# Patient Record
Sex: Female | Born: 1945
Health system: Southern US, Community
[De-identification: ages and names within clinical notes are randomized; demographics above are authoritative.]

## PROBLEM LIST (undated history)

## (undated) DIAGNOSIS — I4891 Unspecified atrial fibrillation: Secondary | ICD-10-CM

## (undated) DIAGNOSIS — M1990 Unspecified osteoarthritis, unspecified site: Secondary | ICD-10-CM

## (undated) DIAGNOSIS — N319 Neuromuscular dysfunction of bladder, unspecified: Secondary | ICD-10-CM

## (undated) DIAGNOSIS — I10 Essential (primary) hypertension: Secondary | ICD-10-CM

## (undated) DIAGNOSIS — R269 Unspecified abnormalities of gait and mobility: Secondary | ICD-10-CM

## (undated) DIAGNOSIS — K219 Gastro-esophageal reflux disease without esophagitis: Secondary | ICD-10-CM

## (undated) DIAGNOSIS — G35D Multiple sclerosis, unspecified: Secondary | ICD-10-CM

## (undated) DIAGNOSIS — G35 Multiple sclerosis: Secondary | ICD-10-CM

## (undated) DIAGNOSIS — E039 Hypothyroidism, unspecified: Secondary | ICD-10-CM

## (undated) DIAGNOSIS — E7800 Pure hypercholesterolemia, unspecified: Secondary | ICD-10-CM

## (undated) DIAGNOSIS — G500 Trigeminal neuralgia: Secondary | ICD-10-CM

## (undated) HISTORY — DX: Gastro-esophageal reflux disease without esophagitis: K21.9

## (undated) HISTORY — DX: Unspecified abnormalities of gait and mobility: R26.9

## (undated) HISTORY — DX: Trigeminal neuralgia: G50.0

## (undated) HISTORY — DX: Hypothyroidism, unspecified: E03.9

## (undated) HISTORY — PX: ABDOMINAL HYSTERECTOMY: SHX81

## (undated) HISTORY — DX: Neuromuscular dysfunction of bladder, unspecified: N31.9

---

## 1998-09-09 ENCOUNTER — Encounter: Payer: Self-pay | Admitting: Family Medicine

## 1998-09-09 ENCOUNTER — Ambulatory Visit (HOSPITAL_COMMUNITY): Admission: RE | Admit: 1998-09-09 | Discharge: 1998-09-09 | Payer: Self-pay | Admitting: Family Medicine

## 1999-09-24 ENCOUNTER — Inpatient Hospital Stay (HOSPITAL_COMMUNITY): Admission: AD | Admit: 1999-09-24 | Discharge: 1999-09-29 | Payer: Self-pay | Admitting: Neurology

## 1999-09-24 ENCOUNTER — Encounter: Payer: Self-pay | Admitting: Neurology

## 1999-09-29 ENCOUNTER — Inpatient Hospital Stay (HOSPITAL_COMMUNITY)
Admission: RE | Admit: 1999-09-29 | Discharge: 1999-10-07 | Payer: Self-pay | Admitting: Physical Medicine and Rehabilitation

## 1999-09-29 ENCOUNTER — Encounter: Payer: Self-pay | Admitting: Physical Medicine and Rehabilitation

## 2001-01-05 ENCOUNTER — Encounter: Admission: RE | Admit: 2001-01-05 | Discharge: 2001-01-05 | Payer: Self-pay | Admitting: Family Medicine

## 2001-01-05 ENCOUNTER — Encounter: Payer: Self-pay | Admitting: Family Medicine

## 2001-01-13 ENCOUNTER — Encounter: Payer: Self-pay | Admitting: Family Medicine

## 2001-01-13 ENCOUNTER — Encounter: Admission: RE | Admit: 2001-01-13 | Discharge: 2001-01-13 | Payer: Self-pay | Admitting: Family Medicine

## 2002-01-18 ENCOUNTER — Encounter: Payer: Self-pay | Admitting: Family Medicine

## 2002-01-18 ENCOUNTER — Encounter: Admission: RE | Admit: 2002-01-18 | Discharge: 2002-01-18 | Payer: Self-pay | Admitting: Family Medicine

## 2003-12-16 ENCOUNTER — Encounter: Admission: RE | Admit: 2003-12-16 | Discharge: 2004-02-07 | Payer: Self-pay | Admitting: Neurology

## 2005-06-21 ENCOUNTER — Encounter: Admission: RE | Admit: 2005-06-21 | Discharge: 2005-06-21 | Payer: Self-pay | Admitting: Family Medicine

## 2006-03-09 ENCOUNTER — Encounter: Admission: RE | Admit: 2006-03-09 | Discharge: 2006-03-09 | Payer: Self-pay | Admitting: Family Medicine

## 2006-04-25 ENCOUNTER — Emergency Department (HOSPITAL_COMMUNITY): Admission: EM | Admit: 2006-04-25 | Discharge: 2006-04-25 | Payer: Self-pay | Admitting: Emergency Medicine

## 2006-06-10 ENCOUNTER — Encounter: Admission: RE | Admit: 2006-06-10 | Discharge: 2006-06-10 | Payer: Self-pay | Admitting: Family Medicine

## 2007-08-07 ENCOUNTER — Encounter: Admission: RE | Admit: 2007-08-07 | Discharge: 2007-08-07 | Payer: Self-pay | Admitting: Family Medicine

## 2008-08-20 ENCOUNTER — Encounter: Admission: RE | Admit: 2008-08-20 | Discharge: 2008-08-20 | Payer: Self-pay | Admitting: Family Medicine

## 2008-10-29 ENCOUNTER — Encounter: Admission: RE | Admit: 2008-10-29 | Discharge: 2008-12-13 | Payer: Self-pay | Admitting: Neurology

## 2009-12-22 ENCOUNTER — Encounter: Admission: RE | Admit: 2009-12-22 | Discharge: 2009-12-22 | Payer: Self-pay | Admitting: Family Medicine

## 2010-07-08 ENCOUNTER — Ambulatory Visit: Payer: Medicare Other | Admitting: Rehabilitative and Restorative Service Providers"

## 2010-08-21 NOTE — Discharge Summary (Signed)
[facility]  Patient:    Jillian Fisher, Jillian Fisher                    MRN: [id number] Adm. Date:  [id number] Disc. Date: 09/29/99 Attending:  Evern Core                           Discharge Summary  PATIENT ADDRESS: [address] [place], [place] [address]  ADDENDUM: The patient was to be discharged on September 28, 1999 but was kept on 6700 another day because of unavailability of a bed on the rehabilitation service.  She did have urine which smelled of ammonia on September 29, 1999 and it was decided to repeat a catheterized urine and culture and to reevaluate her myelopathy with MRI with and without contrast enhancement of the cervical and thoracic region, which were ordered the day that she subsequently was taken to rehabilitation because a bed became available.  Results of those studies are unknown at this time.  The patient is discharged to the rehabilitation service with pending studies of catheterized urine for urinalysis and culture and MRI with and without enhancement of the cervical and thoracic region. DD:  09/30/99 TD:  09/30/99 Job: [id number]

## 2010-08-21 NOTE — Discharge Summary (Signed)
[place]  Jillian Fisher:    Jillian, Fisher                      MRN: [id number] Adm. Date:  09/29/99 Disc. Date: 10/07/99 Attending:  Laurier Nancy, M.D. Dictator:   Dian Situ, PA CC:         Genene Churn. Love, M.D.             Desma Maxim, M.D.                           Discharge Summary  DISCHARGE DIAGNOSES: 1. Transverse myelitis. 2. History of multiple sclerosis. 3. Proteus urinary tract infection. 4. Stress incontinence, improved.  HISTORY OF PRESENT ILLNESS:  Jillian Fisher is a 65 year old female with a history of MS, on Betaseron since December 1998, who fell a few days prior to admission, with gradual onset of numbness and weakness of the left lower extremity and difficulty with ambulating.  She was admitted by Dr. Genene Churn. Love and noted to be hypokalemic with potassium 2.8 and question etiology of weakness secondary to transverse myelitis.  Jillian Fisher is known to have urinary incontinence, ? secondary to ______ .  UA with greater than 100,000 colonies of gram-negative rods.  MRI of cervical and thoracic spine to be done past admission.  Currently, Jillian Fisher is min-to-mod assist for transfers, mod-assist for ambulating 20 feet with standard walker and having problems with hyperextension of the left knee with ataxia of left lower extremity.  Jillian Fisher to complete five-day course of Solu-Medrol in a.m.  PAST MEDICAL HISTORY:  Significant for MS, hysterectomy, history of urgency and incontinence.  ALLERGIES:  No known drug allergies.  SOCIAL HISTORY:  Jillian Fisher is married and lives with husband in one-level home with two steps at entry.  She was independent prior to fall a week ago.  Has husband who works second shift and daughter who can assist during day.  Has daughter and sister who can provide assistance.  She does not use any tobacco or alcohol.  HOSPITAL COURSE:  Jillian Fisher was admitted to rehab on September 29, 1999 for inpatient therapies to consist of PT and OT daily.  Jillian Fisher was continued on subcu heparin for DVT prophylaxis.  As patients culture grew out greater than 100,000 colonies of gram-negative rods, Cipro was started and Jillian Fisher completed a seven-day course of antibiotic therapy.  She did continue with some problems with stress incontinence and Tofranil was added with improvement in her symptomatology.  Jillian Fisher continued on a prednisone taper during her stay.  MRI of right cervical and thoracic spine done showed ______ multiple areas of increased signals with number and extent of cord lesions to have increased in cervical spine and ______ small hydromyelia at L3 level.  Renal ultrasound showed no hydronephrosis or parenchymal abnormality.   Labs at admission showed hemoglobin 14.3, hematocrit 41.4, WBC 15.7, platelets 311,000.  Sodium 139, potassium 4.0, chloride 101, CO2 30, BUN 20, creatinine 0.9 and glucose 139.  LFTs were within normal limits.  Jillian Fisher was noted to have some diarrhea while on antibiotics and stools were negative for C. difficile toxin.  Stool guaiacs were also negative.  Patients left lower extremity strength has slowly been improving during her stay.  She has remained motivated and has made good progress.  By time of discharge, she was independent for bed mobility, independent for transfers, moderately independent for ambulating 150 feet  using standard walker, moderately independent for simple living skills.  Followup therapies in terms of home health PT to continue past discharge.  Patients endurance level remains limited and she continues to require one to two rest breaks during self-care sessions; with continued therapies, this should hopefully improve with time.  On October 07, 1999, Jillian Fisher is discharged to home.  DISCHARGE MEDICATIONS: 1. Calcitonin nasal spray one squirt q.d. 2. Premarin 0.625 mg per day. 3. Betaseron every other day. 4. Prednisone  20 mg per day x 5 days then discontinue. 5. Tofranil 25 mg per day.  ACTIVITY:  As tolerated with assist for safety.  DIET:  Regular.  SPECIAL INSTRUCTIONS:  Home health physical therapy to continue.  FOLLOWUP:  Jillian Fisher is to follow up with Dr. Desma Maxim and Dr. Sandria Manly in the next few weeks.  Follow up with Dr. Laurier Nancy as needed. DD:  10/27/99 TD:  10/30/99 Job: [id number]

## 2010-08-21 NOTE — Discharge Summary (Signed)
. [facility]  Patient:    Jillian Fisher, Jillian Fisher                    MRN: [id number] Adm. Date:  [id number] Disc. Date: 09/28/99 Attending:  Erich Montane                           Discharge Summary  PATIENTS ADDRESS:  [address], [place], [place]  [address].  DATE OF BIRTH:  07-10-1945  This was one of several [facility] admissions for this 65 year old right-handed black married female from [place], [place], admitted from the office for evaluation of left leg weakness.  HISTORY OF PRESENT ILLNESS:  This patient has a long history of numbness in her hands, beginning in the late 1980s, characterized by numbness more along the palmar surface of her hand and greater on the right than the left.  She had had nerve conduction studies in the past and was followed by Dr. Fonnie Jarvis for the question of carpal tunnel syndrome, but was seen by Dr. Myrtie Neither in 1998, and underwent a cervical MRI study at [facility] showing evidence of long T2 signal changes in the cord at C3 and C5 on November 02, 1996.  The MRI study of the brain on December 27, 1996, at [facility] was markedly abnormal, showing evidence of multiple sclerosis, and CSF evaluation January 11, 1997, showed protein 40, glucose 72, no red blood cells, and 5 white blood cells.  There was an abnormal IgG of 8.2, with upper limits of normal being 3.3, and there were oligoclonal bands present consistent with the diagnosis of multiple sclerosis.  She was begun on Betaseron therapy in December 1998, and generally did very well until this past Sunday, September 21, 1999, when she was at home and fell because her left knee gave way.  She was seen at a local emergent care center and underwent x-rays of her left wrist and of her knee, which showed no significant abnormalities.  She complained of numbness in her left leg and weakness in her left leg without associated  pain. She had great difficulty getting around in her house.  She was seen in the office on September 24, 1999, and admitted.  Although she had no bowel or bladder dysfunction at that time, in the hospital she has had some bladder leakage. There has been no history of Lhermitte sign, single eye visual loss, double vision, swallowing problems, etc.  PAST MEDICAL HISTORY:  Hysterectomy greater than 25 years ago, but no history of high blood pressure, diabetes, heart disease, stroke, cancer, convulsions, unconsciousness, or venereal disease.  SOCIAL HISTORY:  She has been married for approximately 31 years, and finished the eighth grade in school.  Does not drink alcohol or smoke cigarettes.  FAMILY HISTORY:  Mother died at age 35 from myocardial infarction and stroke. Her father died at age 75 from unknown causes.  She has brothers of 56 and 29, living and well, and sisters who are living and well; a son is 52, a daughter is 63, living and well.  PHYSICAL EXAMINATION:  Well-developed, pleasant black female in no acute distress.  Blood pressure in the right and left arm of 140/80, a heart rate of 64 and regular.  No carotid or supraclavicular bruits heard.  Flexion and extension maneuvers revealed restricted motion.  On mental status, she was alert and oriented x 3,  and followed one-, two-, and three-step commands.  Her cranial nerve examination revealed acuity of 20/25 bilaterally with glasses, and the rest of her cranial nerves were normal.  Her motor examination was remarkable for spooning in her left hand held out straight, and her examination revealed significant weakness in the left leg distally more than proximally and 3/5 range proximally and 2/5 range distally.  Sensory examination was intact, surprisingly.  Pinprick to light touch ______ operation testing and deep tendon reflexes were 2+.  Her plantar responses were either withdrawal or upgoing.  Her general examination was  unremarkable.  LABORATORY DATA:  Urinalysis was unremarkable, except for urobilinogen of 1.0. The hemoglobin was 13.0, hematocrit was 36.3, white blood cell count was 8000, platelet count was 290,000.  Sodium 139, potassium low at 2.8, and she was placed on potassium replacement with a value of 3.4, glucose was 118, BUN 11, creatinine 0.9, calcium 9.8, total protein 8.0, albumin 3.8.  Liver function tests were normal.  Urine culture has grown no growth to date.  Her 12-lead EKG showed normal sinus rhythm and left atrial enlargement with nonspecific EKG.  Her chest x-ray revealed a normal chest x-ray.  HOSPITAL COURSE:  The patient was admitted, placed on high-dose IV Solu-Medrol of 250 mg IV q.6h. for five days.  She is currently in the fourth day of therapy.  She was seen in consultation by the rehabilitation service and felt to be a good candidate for rehabilitation.  She was followed by the OT and PT team.  Dr. Thomasena Edis felt, as the rehabilitation specialist, that she would benefit from a stay on rehabilitation, and she was to go on September 28, 1999.  No other studies have been done at this time of her spinal cord, but she did develop some incontinence in the hospital.  It is felt that she most likely had had transverse myelitis secondary to multiple sclerosis.  There was definite improvement in her strength in the hospital and improvement in her gait.  Her graphic sheet revealed that she remained afebrile.  Her blood pressure was normal.  Her heart rate was 72, respiratory rate was 22, with an O2 of 98%.  At the time of transfer, she was able to get up with assistance only and was able to walk from the bed to the chair and was able to sit up for several hours at a time.  She seemed to have more of an apraxia with her left leg and, at the time of discharge, she had weakness distally much more than proximally in her left foot and leg with what I thought were downgoing toes at this point  in time.  IMPRESSION: 1. Left leg monoparesis, code 344.30. 2. Multiple sclerosis, code 340. 3. Gait disorder, probably secondary to #2, code 781.2. 4. Hypokalemia.   PLAN:  Repeat her electrolytes and transfer her to the rehabilitation service. If she has not shown significant improvement, the possibility of MRI of the cervical and thoracic region may be obtained in the future. DD:  09/28/99 TD:  09/28/99 Job: [id number]

## 2010-12-14 ENCOUNTER — Other Ambulatory Visit: Payer: Self-pay | Admitting: Family Medicine

## 2010-12-14 DIAGNOSIS — Z1231 Encounter for screening mammogram for malignant neoplasm of breast: Secondary | ICD-10-CM

## 2010-12-28 ENCOUNTER — Ambulatory Visit
Admission: RE | Admit: 2010-12-28 | Discharge: 2010-12-28 | Disposition: A | Discharge status: Home / Self Care | Payer: Medicare Other | Source: Ambulatory Visit | Attending: Family Medicine | Admitting: Family Medicine

## 2010-12-28 DIAGNOSIS — Z1231 Encounter for screening mammogram for malignant neoplasm of breast: Secondary | ICD-10-CM

## 2012-01-13 ENCOUNTER — Other Ambulatory Visit: Payer: Self-pay | Admitting: Family Medicine

## 2012-01-13 DIAGNOSIS — Z1231 Encounter for screening mammogram for malignant neoplasm of breast: Secondary | ICD-10-CM

## 2012-01-20 ENCOUNTER — Ambulatory Visit
Admission: RE | Admit: 2012-01-20 | Discharge: 2012-01-20 | Disposition: A | Discharge status: Home / Self Care | Payer: Medicare Other | Source: Ambulatory Visit | Attending: Family Medicine | Admitting: Family Medicine

## 2012-01-20 DIAGNOSIS — Z1231 Encounter for screening mammogram for malignant neoplasm of breast: Secondary | ICD-10-CM

## 2012-01-24 ENCOUNTER — Encounter (HOSPITAL_COMMUNITY): Payer: Self-pay | Admitting: Emergency Medicine

## 2012-01-24 ENCOUNTER — Emergency Department (HOSPITAL_COMMUNITY): Payer: Medicare Other

## 2012-01-24 ENCOUNTER — Emergency Department (HOSPITAL_COMMUNITY)
Admission: EM | Admit: 2012-01-24 | Discharge: 2012-01-24 | Disposition: A | Discharge status: Home / Self Care | Payer: Medicare Other | Attending: Emergency Medicine | Admitting: Emergency Medicine

## 2012-01-24 DIAGNOSIS — Z90710 Acquired absence of both cervix and uterus: Secondary | ICD-10-CM | POA: Insufficient documentation

## 2012-01-24 DIAGNOSIS — Z87891 Personal history of nicotine dependence: Secondary | ICD-10-CM | POA: Insufficient documentation

## 2012-01-24 DIAGNOSIS — I10 Essential (primary) hypertension: Secondary | ICD-10-CM | POA: Insufficient documentation

## 2012-01-24 DIAGNOSIS — G35 Multiple sclerosis: Secondary | ICD-10-CM | POA: Insufficient documentation

## 2012-01-24 DIAGNOSIS — R51 Headache: Secondary | ICD-10-CM | POA: Insufficient documentation

## 2012-01-24 DIAGNOSIS — M129 Arthropathy, unspecified: Secondary | ICD-10-CM | POA: Insufficient documentation

## 2012-01-24 DIAGNOSIS — E7800 Pure hypercholesterolemia, unspecified: Secondary | ICD-10-CM | POA: Insufficient documentation

## 2012-01-24 HISTORY — DX: Multiple sclerosis: G35

## 2012-01-24 HISTORY — DX: Multiple sclerosis, unspecified: G35.D

## 2012-01-24 HISTORY — DX: Pure hypercholesterolemia, unspecified: E78.00

## 2012-01-24 HISTORY — DX: Essential (primary) hypertension: I10

## 2012-01-24 HISTORY — DX: Unspecified osteoarthritis, unspecified site: M19.90

## 2012-01-24 MED ORDER — METOCLOPRAMIDE HCL 5 MG/ML IJ SOLN
10.0000 mg | Freq: Once | INTRAMUSCULAR | Status: AC
  Administered 2012-01-24: 10 mg via INTRAVENOUS
  Filled 2012-01-24: qty 2

## 2012-01-24 MED ORDER — DIPHENHYDRAMINE HCL 50 MG/ML IJ SOLN
25.0000 mg | Freq: Once | INTRAMUSCULAR | Status: AC
  Administered 2012-01-24: 25 mg via INTRAVENOUS
  Filled 2012-01-24: qty 1

## 2012-01-24 MED ORDER — DEXAMETHASONE SODIUM PHOSPHATE 10 MG/ML IJ SOLN
10.0000 mg | Freq: Once | INTRAMUSCULAR | Status: AC
  Administered 2012-01-24: 10 mg via INTRAVENOUS
  Filled 2012-01-24: qty 1

## 2012-01-24 MED ORDER — SODIUM CHLORIDE 0.9 % IV BOLUS (SEPSIS)
500.0000 mL | Freq: Once | INTRAVENOUS | Status: AC
  Administered 2012-01-24: 500 mL via INTRAVENOUS

## 2012-01-24 NOTE — ED Provider Notes (Signed)
Medical screening examination/treatment/procedure(s) were performed by non-physician practitioner and as supervising physician I was immediately available for consultation/collaboration.  Leaha Cuervo M David Rodriquez, MD 01/24/12 0501 

## 2012-01-24 NOTE — ED Provider Notes (Signed)
History     CSN: [id number]  Arrival date & time 01/24/12  0221   First MD Initiated Contact with Patient 01/24/12 0245      Chief Complaint  Patient presents with  . Headache   HPI  History provided by the patient. Patient is a 66 year old female with history of hypertension, hypercholesterolemia and multiple sclerosis who presents with complaints of a headache that began yesterday around 3 PM. Symptoms began acutely and have been persistent. Headache is not described as the worst headache of the patient's life. Headache does feel different than previous sinus headaches or other headache she has ever had. Patient has difficulty describing what makes her headache different today. Headache is located primarily in the back of the head and temporal area. Symptoms are slightly worse with lites and movement. She denies any fever, chills, sweats, ataxia, weakness or numbness in extremities, speech difficulty or weakness in face muscles.     Past Medical History  Diagnosis Date  . Multiple sclerosis   . Hypertension   . Hypercholesteremia   . Arthritis     Past Surgical History  Procedure Date  . Abdominal hysterectomy     History reviewed. No pertinent family history.  History  Substance Use Topics  . Smoking status: Former Games developer  . Smokeless tobacco: Not on file   Comment: quit 30-40 years ago (documented in 2013)  . Alcohol Use: No    OB History    Grav Para Term Preterm Abortions TAB SAB Ect Mult Living                  Review of Systems  Constitutional: Negative for fever, chills, diaphoresis and appetite change.  Respiratory: Negative for shortness of breath.   Cardiovascular: Negative for chest pain and palpitations.  Gastrointestinal: Positive for nausea. Negative for vomiting, abdominal pain, diarrhea and constipation.  Neurological: Positive for headaches. Negative for dizziness, syncope, facial asymmetry, speech difficulty, weakness, light-headedness and  numbness.    Allergies  Sulfa antibiotics  Home Medications   Current Outpatient Rx  Name Route Sig Dispense Refill  . ACETAMINOPHEN 500 MG PO TABS Oral Take 1,000 mg by mouth every 6 (six) hours as needed. For pain    . ADULT MULTIVITAMIN W/MINERALS CH Oral Take 1 tablet by mouth daily.      BP 159/98  Pulse 110  Temp 99 F (37.2 C) (Oral)  Resp 18  SpO2 97%  Physical Exam  Nursing note and vitals reviewed. Constitutional: She is oriented to person, place, and time. She appears well-developed and well-nourished. No distress.  HENT:  Head: Normocephalic and atraumatic.  Eyes: Conjunctivae normal and EOM are normal. Pupils are equal, round, and reactive to light.  Neck: Normal range of motion. Neck supple.       No meningeal signs  Cardiovascular: Normal rate and regular rhythm.   No murmur heard. Pulmonary/Chest: Effort normal and breath sounds normal. No respiratory distress. She has no wheezes. She has no rales.  Abdominal: Soft. There is no tenderness. There is no rigidity, no rebound, no guarding, no CVA tenderness and no tenderness at McBurney's point.  Neurological: She is alert and oriented to person, place, and time. She has normal strength. No cranial nerve deficit or sensory deficit. Gait normal.  Skin: Skin is warm and dry. No rash noted.  Psychiatric: She has a normal mood and affect. Her behavior is normal.    ED Course  Procedures   Ct Head Wo Contrast  01/24/2012  *  RADIOLOGY REPORT*  Clinical Data: Headache  CT HEAD WITHOUT CONTRAST  Technique:  Contiguous axial images were obtained from the base of the skull through the vertex without contrast.  Comparison: None.  Findings: Periventricular and subcortical white matter hypodensities are most in keeping with chronic microangiopathic change. There is no evidence for acute hemorrhage, hydrocephalus, mass lesion, or abnormal extra-axial fluid collection.  No definite CT evidence for acute infarction.  The  visualized paranasal sinuses and mastoid air cells are predominately clear.  IMPRESSION: White matter changes as above.  No CT evidence of acute intracranial abnormality.   Original Report Authenticated By: Waneta Martins, M.D.      1. Headache       MDM  3:00 AM patient seen and evaluated. Patient currently is lying flat and comfortable. No acute distress.   Patient reports having complete resolution of headache symptoms. She feels much better after IV fluids and medications. CT scan without any acute findings. At this time patient is ready to return home. She will plan to followup with her primary care provider and neurology specialist.        Angus Seller, PA 01/24/12 [phone]

## 2012-01-24 NOTE — ED Provider Notes (Signed)
Medical screening examination/treatment/procedure(s) were performed by non-physician practitioner and as supervising physician I was immediately available for consultation/collaboration.  Daanya Lanphier M Davon Abdelaziz, MD 01/24/12 0551 

## 2012-01-24 NOTE — ED Provider Notes (Deleted)
History     CSN: [id number]  Arrival date & time 01/24/12  0221   First MD Initiated Contact with Patient 01/24/12 0245      Chief Complaint  Patient presents with  . Headache   HPI  History provided by the patient. Patient is a 66 year old female with history of hypertension, hypercholesterolemia and multiple sclerosis who presents with complaints of a headache that began yesterday around 3 PM. Symptoms began acutely and have been persistent. Headache is not described as the worst headache of the patient's life. Headache does feel different than previous sinus headaches or other headache she has ever had. Patient has difficulty describing what makes her headache different today. Headache is located primarily in the back of the head and temporal area. Symptoms are slightly worse with lites and movement. She denies any fever, chills, sweats, ataxia, weakness or numbness in extremities, speech difficulty or weakness in face muscles.     Past Medical History  Diagnosis Date  . Multiple sclerosis   . Hypertension   . Hypercholesteremia   . Arthritis     Past Surgical History  Procedure Date  . Abdominal hysterectomy     History reviewed. No pertinent family history.  History  Substance Use Topics  . Smoking status: Former Games developer  . Smokeless tobacco: Not on file   Comment: quit 30-40 years ago (documented in 2013)  . Alcohol Use: No    OB History    Grav Para Term Preterm Abortions TAB SAB Ect Mult Living                  Review of Systems  Constitutional: Negative for fever, chills, diaphoresis and appetite change.  Respiratory: Negative for shortness of breath.   Cardiovascular: Negative for chest pain and palpitations.  Gastrointestinal: Positive for nausea. Negative for vomiting, abdominal pain, diarrhea and constipation.  Genitourinary: Negative for dysuria, frequency, hematuria and flank pain.  Neurological: Positive for headaches. Negative for dizziness and  light-headedness.    Allergies  Sulfa antibiotics  Home Medications   Current Outpatient Rx  Name Route Sig Dispense Refill  . ACETAMINOPHEN 500 MG PO TABS Oral Take 1,000 mg by mouth every 6 (six) hours as needed. For pain    . ADULT MULTIVITAMIN W/MINERALS CH Oral Take 1 tablet by mouth daily.      BP 159/98  Pulse 110  Temp 99 F (37.2 C) (Oral)  Resp 18  SpO2 97%  Physical Exam  Nursing note and vitals reviewed. Constitutional: She is oriented to person, place, and time. She appears well-developed and well-nourished. No distress.  HENT:  Head: Normocephalic and atraumatic.  Eyes: Conjunctivae normal and EOM are normal. Pupils are equal, round, and reactive to light.  Neck: Normal range of motion. Neck supple.       No meningeal signs  Cardiovascular: Normal rate and regular rhythm.   No murmur heard. Pulmonary/Chest: Effort normal and breath sounds normal. No respiratory distress. She has no wheezes. She has no rales.  Abdominal: Soft. There is no tenderness. There is no rigidity, no rebound, no guarding, no CVA tenderness and no tenderness at McBurney's point.  Neurological: She is alert and oriented to person, place, and time. She has normal strength. No cranial nerve deficit or sensory deficit. Gait normal.  Skin: Skin is warm and dry. No rash noted.  Psychiatric: She has a normal mood and affect. Her behavior is normal.    ED Course  Procedures   Ct Head Wo Contrast  01/24/2012  *RADIOLOGY REPORT*  Clinical Data: Headache  CT HEAD WITHOUT CONTRAST  Technique:  Contiguous axial images were obtained from the base of the skull through the vertex without contrast.  Comparison: None.  Findings: Periventricular and subcortical white matter hypodensities are most in keeping with chronic microangiopathic change. There is no evidence for acute hemorrhage, hydrocephalus, mass lesion, or abnormal extra-axial fluid collection.  No definite CT evidence for acute infarction.  The  visualized paranasal sinuses and mastoid air cells are predominately clear.  IMPRESSION: White matter changes as above.  No CT evidence of acute intracranial abnormality.   Original Report Authenticated By: Waneta Martins, M.D.      1. Headache       MDM  3:00 AM patient seen and evaluated. Patient currently lying flat and comfortable. No acute distress.    Patient reports having complete resolution of headache symptoms. She feels much better after IV fluids and medications. CT scan without any acute findings. At this time patient is ready to return home. She'll followup with her primary care provider and neurology specialist.      Angus Seller, PA 01/24/12 [id number]

## 2012-01-24 NOTE — ED Notes (Addendum)
Pt reports having headache since 3pm, and bil leg pain, a/w nausea; pt's speech clear, no drift, grips equal, pt has slight R sided facial droop--asked daughter if that was new and stated that is not new- she has noticed it before when Dr Sandria Manly asked her to do same

## 2012-03-31 ENCOUNTER — Other Ambulatory Visit: Payer: Self-pay | Admitting: Neurology

## 2012-03-31 DIAGNOSIS — G35 Multiple sclerosis: Secondary | ICD-10-CM

## 2012-04-10 ENCOUNTER — Other Ambulatory Visit: Payer: Medicare Other

## 2012-05-15 ENCOUNTER — Ambulatory Visit
Admission: RE | Admit: 2012-05-15 | Discharge: 2012-05-15 | Disposition: A | Discharge status: Home / Self Care | Payer: Medicare Other | Source: Ambulatory Visit | Attending: Neurology | Admitting: Neurology

## 2012-05-15 DIAGNOSIS — G35 Multiple sclerosis: Secondary | ICD-10-CM

## 2012-05-15 MED ORDER — GADOBENATE DIMEGLUMINE 529 MG/ML IV SOLN
20.0000 mL | Freq: Once | INTRAVENOUS | Status: AC | PRN
  Administered 2012-05-15: 20 mL via INTRAVENOUS

## 2012-10-10 ENCOUNTER — Telehealth: Payer: Self-pay | Admitting: Neurology

## 2012-10-10 MED ORDER — INTERFERON BETA-1B 0.3 MG ~~LOC~~ KIT: 0.2500 mg | PACK | SUBCUTANEOUS | Status: DC

## 2012-10-10 NOTE — Telephone Encounter (Signed)
Former [facility] patient assigned to Dr Anne Hahn.  I spoke with patient.  She wants meds sent to [facility] pharmacy.

## 2012-12-25 ENCOUNTER — Encounter: Payer: Self-pay | Admitting: Neurology

## 2012-12-26 ENCOUNTER — Telehealth: Payer: Self-pay | Admitting: Neurology

## 2012-12-26 MED ORDER — INTERFERON BETA-1B 0.3 MG ~~LOC~~ KIT: 0.2500 mg | PACK | SUBCUTANEOUS | Status: DC

## 2012-12-26 NOTE — Telephone Encounter (Signed)
Rx sent 

## 2013-01-25 ENCOUNTER — Encounter: Payer: Self-pay | Admitting: Neurology

## 2013-01-25 ENCOUNTER — Ambulatory Visit (INDEPENDENT_AMBULATORY_CARE_PROVIDER_SITE_OTHER): Payer: Medicare Other | Admitting: Neurology

## 2013-01-25 ENCOUNTER — Encounter (INDEPENDENT_AMBULATORY_CARE_PROVIDER_SITE_OTHER): Payer: Self-pay

## 2013-01-25 VITALS — BP 178/80 | HR 80 | Ht 64.0 in | Wt 209.0 lb

## 2013-01-25 DIAGNOSIS — N319 Neuromuscular dysfunction of bladder, unspecified: Secondary | ICD-10-CM

## 2013-01-25 DIAGNOSIS — G35 Multiple sclerosis: Secondary | ICD-10-CM

## 2013-01-25 DIAGNOSIS — R269 Unspecified abnormalities of gait and mobility: Secondary | ICD-10-CM | POA: Insufficient documentation

## 2013-01-25 HISTORY — DX: Unspecified abnormalities of gait and mobility: R26.9

## 2013-01-25 HISTORY — DX: Neuromuscular dysfunction of bladder, unspecified: N31.9

## 2013-01-25 MED ORDER — INTERFERON BETA-1B 0.3 MG ~~LOC~~ KIT: 0.2500 mg | PACK | SUBCUTANEOUS | Status: DC

## 2013-01-25 MED ORDER — DICLOFENAC SODIUM 75 MG PO TBEC: 75.0000 mg | DELAYED_RELEASE_TABLET | Freq: Two times a day (BID) | ORAL | Status: DC

## 2013-01-25 NOTE — Progress Notes (Signed)
Reason for visit: Multiple sclerosis  Jillian Fisher is an 67 y.o. female  History of present illness:  Jillian Fisher is a 67 year old right-handed black female with a history multiple sclerosis diagnosed around 101. The patient has been on Betaseron since that time, and she has tolerated the medication. The patient last had a clinical MS attack in 2010. The patient indicates that she has some numbness of the hands, and she has some problems with bladder control, and gait instability. When the patient is out of the house, she uses a walker for ambulation. She denies any falls. The patient denies any new neurologic issues since she was seen in February 2014. The patient denies any visual disturbances, and she indicates that she has never had an optic neuritis. MRI evaluations done in February 2014 show periventricular white matter changes, and at least 2 spinal cord lesions, one effecting the upper thoracic area and one at the C2-3 level of the spinal cord. The patient returns to this office for an evaluation. Over the last week, the patient developed some pain in the left shoulder with putting pressure on the arm. The shoulder does not hurt when she is resting.  Past Medical History  Diagnosis Date  . Multiple sclerosis   . Hypertension   . Hypercholesteremia   . Arthritis   . Hypothyroidism   . Abnormality of gait 01/25/2013  . Neurogenic bladder 01/25/2013    Past Surgical History  Procedure Laterality Date  . Abdominal hysterectomy      Family History  Problem Relation Age of Onset  . Heart attack Mother   . Multiple sclerosis Sister   . Heart disease Sister   . Diabetes Other     Social history:  reports that she has never smoked. She does not have any smokeless tobacco history on file. She reports that she does not drink alcohol or use illicit drugs.    Allergies  Allergen Reactions  . Sulfa Antibiotics Rash    Medications:  Current Outpatient Prescriptions on File  Prior to Visit  Medication Sig Dispense Refill  . acetaminophen (TYLENOL) 500 MG tablet Take 1,000 mg by mouth every 6 (six) hours as needed. For pain      . hydrochlorothiazide (HYDRODIURIL) 25 MG tablet Take 25 mg by mouth daily.      Marland Kitchen levothyroxine (SYNTHROID, LEVOTHROID) 50 MCG tablet Take 50 mcg by mouth daily.      . Multiple Vitamin (MULTIVITAMIN WITH MINERALS) TABS Take 1 tablet by mouth daily.      Marland Kitchen oxybutynin (DITROPAN) 5 MG tablet Take 5 mg by mouth daily.      . rosuvastatin (CRESTOR) 20 MG tablet Take 20 mg by mouth daily. 1/2 po daily       No current facility-administered medications on file prior to visit.    ROS:  Out of a complete 14 system review of symptoms, the patient complains only of the following symptoms, and all other reviewed systems are negative.  Gait disturbance Numbness Left shoulder pain  Blood pressure 178/80, pulse 80, height 5\' 4"  (1.626 m), weight 209 lb (94.802 kg).  Physical Exam  General: The patient is alert and cooperative at the time of the examination. The patient is minimally obese.  Neuromuscular: The patient has no pain with passive abduction of the left arm. The patient does have pain with internal and external rotation of the shoulder.  Skin: No significant peripheral edema is noted.   Neurologic Exam  Mental status: The  patient is oriented x 3.  Cranial nerves: Facial symmetry is present. Speech is normal, no aphasia or dysarthria is noted. Extraocular movements are full. Visual fields are full. Pupils are equal, round, and reactive to light. Discs are flat bilaterally.  Motor: The patient has good strength in all 4 extremities. The patient is able to arise from a seated position with arms crossed.  Sensory examination: Pinprick sensation is symmetric on the face, arms, and legs.  Coordination: The patient has good finger-nose-finger and heel-to-shin bilaterally.  Gait and station: The patient has a slightly wide-based,  unsteady gait. Can gait is unsteady. Romberg is negative. No drift is seen.  Reflexes: Deep tendon reflexes are symmetric in arms, the left knee jerk reflexes elevated relative to the right. The ankle jerk reflexes are symmetric.   Assessment/Plan:  1. Multiple sclerosis  2. Gait disturbance  3. Neurogenic bladder  4. Left shoulder pain, likely tendinopathy  The patient will be placed on diclofenac taking 75 mg twice daily for the next 2 weeks. If the left shoulder pain does not improve, the patient will be referred to an orthopedic surgeon. The patient is doing well with her multiple sclerosis, and she will continue Betaseron. The patient indicates that she gets routine blood work done through her primary care physician at least once a year. The patient will followup through this office in 9 months.  Marlan Palau MD 01/25/2013 8:41 AM  [facility] [address] [place], [place] [address]  Phone [phone] Fax [phone]

## 2013-03-02 ENCOUNTER — Other Ambulatory Visit: Payer: Self-pay

## 2013-03-02 MED ORDER — INTERFERON BETA-1B 0.3 MG ~~LOC~~ KIT: 0.2500 mg | PACK | SUBCUTANEOUS | Status: DC

## 2013-03-09 ENCOUNTER — Other Ambulatory Visit: Payer: Self-pay

## 2013-03-09 MED ORDER — INTERFERON BETA-1B 0.3 MG ~~LOC~~ KIT: 0.2500 mg | PACK | SUBCUTANEOUS | Status: DC

## 2013-03-09 NOTE — Telephone Encounter (Signed)
[facility] called saying the patient would like her Rx via mail order instead of local pharm.  Rx has been resent.

## 2013-03-20 ENCOUNTER — Other Ambulatory Visit: Payer: Self-pay | Admitting: Neurology

## 2013-05-24 ENCOUNTER — Telehealth: Payer: Self-pay | Admitting: Neurology

## 2013-05-24 NOTE — Telephone Encounter (Signed)
Tammy from [facility] calling about Betaseron refill for patient, patient is expecting medication tomorrow. Tammy is faxing over the request but it can also be called into [phone].

## 2013-05-24 NOTE — Telephone Encounter (Signed)
We already sent a one year Rx to them in Dec.  I called the number left in this message and it was for [facility] not [facility].  They are requesting the member ID before I am able to proceed.  The last ins card scanned in the patients chart is for Medicaid.  I called the patient to try and obtain the ID # for [facility].  She gave me [id number].  I tried this number and it was not valid.  I called her again, she said she also had a number of [phone] (Not correct) [phone] (also not correct) and [id number].  I provided this info to [facility], via their automated system, and online system.  Both said no Berkley Harveyauth is required at this time.  I called the [facility] number we have in Epic.  Spoke with ElbingMonica.  She asked that I call the other branch at [phone].  Spoke with Synetta FailAnita.  She said the patient does not use [facility] for her Rx's and hasn't used their branch since 2010.  They do not know who the patient uses now.  I called the original number left (for [employer]) back.  Spoke with [employer].  She is faxing me the form directly that they need completed.  I have received the form, completed it and faxed it back.  The will follow up with the patient and us if any further info is needed.

## 2013-10-03 ENCOUNTER — Ambulatory Visit (INDEPENDENT_AMBULATORY_CARE_PROVIDER_SITE_OTHER): Payer: Medicare HMO | Admitting: Neurology

## 2013-10-03 ENCOUNTER — Ambulatory Visit: Payer: Medicare Other | Admitting: Neurology

## 2013-10-03 ENCOUNTER — Encounter (INDEPENDENT_AMBULATORY_CARE_PROVIDER_SITE_OTHER): Payer: Self-pay

## 2013-10-03 ENCOUNTER — Encounter: Payer: Self-pay | Admitting: Neurology

## 2013-10-03 VITALS — BP 200/67 | HR 61 | Ht 64.0 in | Wt 202.0 lb

## 2013-10-03 DIAGNOSIS — R269 Unspecified abnormalities of gait and mobility: Secondary | ICD-10-CM

## 2013-10-03 DIAGNOSIS — G35 Multiple sclerosis: Secondary | ICD-10-CM

## 2013-10-03 DIAGNOSIS — N319 Neuromuscular dysfunction of bladder, unspecified: Secondary | ICD-10-CM

## 2013-10-03 NOTE — Progress Notes (Signed)
Reason for visit: Multiple sclerosis  Jillian Fisher is an 68 y.o. female  History of present illness:  Jillian Fisher is a 69 year old right-handed black female with a history of multiple sclerosis. 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 The patient has been on Betaseron, and she has tolerated the medication well. She has noted no progression in her deficits since last seen. The last MRI evaluation that she obtain was in February of 2014. The patient has brain and spinal cord lesions. She denies any change in vision, strength, bowel bladder control. She does report some slight numbness of the left hand. The shoulder discomfort she had on her last visit has improved. She returns for an evaluation. She walks with a walker, but she denies any falls since last seen.  Past Medical History  Diagnosis Date  . Multiple sclerosis   . Hypertension   . Hypercholesteremia   . Arthritis   . Hypothyroidism   . Abnormality of gait 01/25/2013  . Neurogenic bladder 01/25/2013    Past Surgical History  Procedure Laterality Date  . Abdominal hysterectomy      Family History  Problem Relation Age of Onset  . Heart attack Mother   . Multiple sclerosis Sister   . Heart disease Sister   . Diabetes Other     Social history:  reports that she has never smoked. She does not have any smokeless tobacco history on file. She reports that she does not drink alcohol or use illicit drugs.    Allergies  Allergen Reactions  . Sulfa Antibiotics Rash    Medications:  Current Outpatient Prescriptions on File Prior to Visit  Medication Sig Dispense Refill  . acetaminophen (TYLENOL) 500 MG tablet Take 1,000 mg by mouth every 6 (six) hours as needed. For pain      . diclofenac (VOLTAREN) 75 MG EC tablet TAKE 1 TABLET TWICE DAILY  60 tablet  6  . hydrochlorothiazide (HYDRODIURIL) 25 MG tablet Take 25 mg by mouth daily.      . Interferon Beta-1b (BETASERON) 0.3 MG  KIT injection Inject 0.25 mg into the skin every other day.  45 vial  3  . levothyroxine (SYNTHROID, LEVOTHROID) 50 MCG tablet Take 50 mcg by mouth daily.      . Multiple Vitamin (MULTIVITAMIN WITH MINERALS) TABS Take 1 tablet by mouth daily.      Marland Kitchen oxybutynin (DITROPAN) 5 MG tablet Take 5 mg by mouth daily.      . rosuvastatin (CRESTOR) 20 MG tablet Take 20 mg by mouth daily. 1/2 po daily       No current facility-administered medications on file prior to visit.    ROS:  Out of a complete 14 system review of symptoms, the patient complains only of the following symptoms, and all other reviewed systems are negative.  Excessive thirst Joint pain  Blood pressure 200/67, pulse 61, height _0  (1.626 m), weight 202 lb (91.627 kg).  Physical Exam  General: The patient is alert and cooperative at the time of the examination. The patient is moderately obese.  Skin: No significant peripheral edema is noted.   Neurologic Exam  Mental status: The patient is oriented x 3.  Cranial nerves: Facial symmetry is present. Speech is normal, no aphasia or dysarthria is noted. Extraocular movements are full. Visual fields are full.  Motor: The patient has good strength in all 4 extremities, with the exception that there is 4/5 strength proximally in  the right leg.  Sensory examination: Pinprick and soft touch sensation are symmetric on the face, arms, and legs.  Coordination: The patient has good finger-nose-finger and heel-to-shin bilaterally.  Gait and station: The patient has a slightly wide-based, unsteady gait. The patient walks with a walker, and has relatively good stability while using the walker. Tandem gait was not attempted. Romberg is negative. No drift is seen.  Reflexes: Deep tendon reflexes are symmetric.   Assessment/Plan:  One. Multiple sclerosis  2. Gait disorder  3. Neurogenic bladder  The patient will continue the Betaseron at this time. She indicates that she will  be seeing her primary care physician within the next month, and annual blood work will be done at that time. The patient will followup in 6 months, and we will consider repeat studies of the brain and cervical spine at that time for followup. She will continue the Betaseron for now.  Jill Alexanders MD 10/03/2013 8:23 AM  [facility] [address] [place], [place] [address]  Phone [phone] Fax [phone]

## 2013-10-03 NOTE — Patient Instructions (Signed)

## 2013-10-18 ENCOUNTER — Other Ambulatory Visit: Payer: Self-pay | Admitting: Neurology

## 2013-10-25 ENCOUNTER — Ambulatory Visit: Payer: Medicare Other | Admitting: Neurology

## 2014-02-20 ENCOUNTER — Encounter: Payer: Self-pay | Admitting: Neurology

## 2014-02-26 ENCOUNTER — Encounter: Payer: Self-pay | Admitting: Neurology

## 2014-02-26 ENCOUNTER — Other Ambulatory Visit: Payer: Self-pay | Admitting: Neurology

## 2014-04-07 ENCOUNTER — Encounter (HOSPITAL_COMMUNITY): Payer: Self-pay | Admitting: Emergency Medicine

## 2014-04-07 ENCOUNTER — Emergency Department (HOSPITAL_COMMUNITY)
Admission: EM | Admit: 2014-04-07 | Discharge: 2014-04-07 | Disposition: A | Discharge status: Home / Self Care | Payer: Commercial Managed Care - HMO | Attending: Emergency Medicine | Admitting: Emergency Medicine

## 2014-04-07 DIAGNOSIS — I10 Essential (primary) hypertension: Secondary | ICD-10-CM | POA: Insufficient documentation

## 2014-04-07 DIAGNOSIS — M25512 Pain in left shoulder: Secondary | ICD-10-CM | POA: Diagnosis not present

## 2014-04-07 DIAGNOSIS — Z79899 Other long term (current) drug therapy: Secondary | ICD-10-CM | POA: Diagnosis not present

## 2014-04-07 DIAGNOSIS — K219 Gastro-esophageal reflux disease without esophagitis: Secondary | ICD-10-CM | POA: Insufficient documentation

## 2014-04-07 DIAGNOSIS — M79602 Pain in left arm: Secondary | ICD-10-CM | POA: Diagnosis present

## 2014-04-07 DIAGNOSIS — G35 Multiple sclerosis: Secondary | ICD-10-CM | POA: Diagnosis not present

## 2014-04-07 DIAGNOSIS — E780 Pure hypercholesterolemia: Secondary | ICD-10-CM | POA: Diagnosis not present

## 2014-04-07 DIAGNOSIS — Z791 Long term (current) use of non-steroidal anti-inflammatories (NSAID): Secondary | ICD-10-CM | POA: Insufficient documentation

## 2014-04-07 DIAGNOSIS — E039 Hypothyroidism, unspecified: Secondary | ICD-10-CM | POA: Diagnosis not present

## 2014-04-07 DIAGNOSIS — M1990 Unspecified osteoarthritis, unspecified site: Secondary | ICD-10-CM | POA: Diagnosis not present

## 2014-04-07 DIAGNOSIS — Z87448 Personal history of other diseases of urinary system: Secondary | ICD-10-CM | POA: Diagnosis not present

## 2014-04-07 LAB — I-STAT CHEM 8, ED
BUN: 13 mg/dL (ref 6–23)
Calcium, Ion: 1.07 mmol/L — ABNORMAL LOW (ref 1.13–1.30)
Chloride: 103 mEq/L (ref 96–112)
Creatinine, Ser: 0.8 mg/dL (ref 0.50–1.10)
Glucose, Bld: 126 mg/dL — ABNORMAL HIGH (ref 70–99)
HCT: 42 % (ref 36.0–46.0)
Hemoglobin: 14.3 g/dL (ref 12.0–15.0)
Potassium: 3.9 mmol/L (ref 3.5–5.1)
Sodium: 140 mmol/L (ref 135–145)
TCO2: 22 mmol/L (ref 0–100)

## 2014-04-07 MED ORDER — TRAMADOL HCL 50 MG PO TABS: 50.0000 mg | ORAL_TABLET | Freq: Four times a day (QID) | ORAL | Status: DC | PRN

## 2014-04-07 MED ORDER — MORPHINE SULFATE 4 MG/ML IJ SOLN
6.0000 mg | Freq: Once | INTRAMUSCULAR | Status: AC
  Administered 2014-04-07: 6 mg via INTRAVENOUS
  Filled 2014-04-07: qty 2

## 2014-04-07 MED ORDER — SODIUM CHLORIDE 0.9 % IV SOLN
500.0000 mg | Freq: Once | INTRAVENOUS | Status: AC
  Administered 2014-04-07: 500 mg via INTRAVENOUS
  Filled 2014-04-07: qty 4

## 2014-04-07 MED ORDER — ONDANSETRON HCL 4 MG/2ML IJ SOLN
4.0000 mg | Freq: Once | INTRAMUSCULAR | Status: AC
  Administered 2014-04-07: 4 mg via INTRAVENOUS
  Filled 2014-04-07: qty 2

## 2014-04-07 NOTE — Discharge Instructions (Signed)
Arthralgia Arthralgia is joint pain. A joint is a place where two bones meet. Joint pain can happen for many reasons. The joint can be bruised, stiff, infected, or weak from aging. Pain usually goes away after resting and taking medicine for soreness.  HOME CARE  Rest the joint as told by your doctor.  Keep the sore joint raised (elevated) for the first 24 hours.  Put ice on the joint area.  Put ice in a plastic bag.  Place a towel between your skin and the bag.  Leave the ice on for 15-20 minutes, 03-04 times a day.  Wear your splint, casting, elastic bandage, or sling as told by your doctor.  Only take medicine as told by your doctor. Do not take aspirin.  Use crutches as told by your doctor. Do not put weight on the joint until told to by your doctor. GET HELP RIGHT AWAY IF:   You have bruising, puffiness (swelling), or more pain.  Your fingers or toes turn blue or start to lose feeling (numb).  Your medicine does not lessen the pain.  Your pain becomes severe.  You have a temperature by mouth above 102 F (38.9 C), not controlled by medicine.  You cannot move or use the joint. MAKE SURE YOU:   Understand these instructions.  Will watch your condition.  Will get help right away if you are not doing well or get worse. Document Released: 03/10/2009 Document Revised: 06/14/2011 Document Reviewed: 03/10/2009 [facility] Patient Information 2015 [place], [place]. This information is not intended to replace advice given to you by your health care provider. Make sure you discuss any questions you have with your health care provider.  Multiple Sclerosis Multiple sclerosis (MS) is a disease of the central nervous system. It leads to the loss of the insulating covering of the nerves (myelin sheath) of your brain. When this happens, brain signals do not get sent properly or may not get sent at all. The age of onset of MS varies.  CAUSES The cause of MS is unknown. However, it is  more common in the [place] than in the [place]. RISK FACTORS There is a higher number of women with MS than men. MS is not an illness that is passed down to you from your family members (inherited). However, your risk of MS is higher if you have a relative with MS. SIGNS AND SYMPTOMS  The symptoms of MS occur in episodes or attacks. These attacks may last weeks to months. There may be long periods of almost no symptoms between attacks. The symptoms of MS vary. This is because of the many different ways it affects the central nervous system. The main symptoms of MS include:  Vision problems and eye pain.  Numbness.  Weakness.  Inability to move your arms, hands, feet, or legs (paralysis).  Balance problems.  Tremors. DIAGNOSIS  Your health care provider can diagnose MS with the help of imaging exams and lab tests. These may include specialized X-ray exams and spinal fluid tests. The best imaging exam to confirm a diagnosis of MS is an MRI. TREATMENT  There is no known cure for MS, but there are medicines that can decrease the number and frequency of attacks. Steroids are often used for short-term relief. Physical and occupational therapy may also help. There are also many new alternative or complementary treatments available to help control the symptoms of MS. Ask your health care provider if any of these other options are right  for you. HOME CARE INSTRUCTIONS   Take medicines as directed by your health care provider.  Exercise as directed by your health care provider. SEEK MEDICAL CARE IF: You begin to feel depressed. SEEK IMMEDIATE MEDICAL CARE IF:  You develop paralysis.  You have problems with bladder, bowel, or sexual function.  You develop mental changes, such as forgetfulness or mood swings.  You have a period of uncontrolled movements (seizure). Document Released: 03/19/2000 Document Revised: 03/27/2013 Document Reviewed: 11/27/2012 [facility]  Patient Information 2015 [place], [place]. This information is not intended to replace advice given to you by your health care provider. Make sure you discuss any questions you have with your health care provider.

## 2014-04-07 NOTE — ED Notes (Signed)
Pt has MS. Takes injection BID for this, this  Am began having LT shoulder pain and generalized weakness, denies any chest pain , no sob, no cough no fever. Daughter states that this is what usually is sx of her going into a crisis with MS. Family called on call Neuro MD [place] and they instructed pt to come to er to get steroids. Pt alert x4, no facial droop, equal grip strength.

## 2014-04-07 NOTE — ED Notes (Signed)
Spoke to [employer] and pt will need transfusion prior to leaving then can be d/c. He will talk with family.

## 2014-04-07 NOTE — ED Provider Notes (Signed)
CSN: [id number]     Arrival date & time 04/07/14  1059 History   First MD Initiated Contact with Patient 04/07/14 1249     Chief Complaint  Patient presents with  . Weakness  . Arm Pain     (Consider location/radiation/quality/duration/timing/severity/associated sxs/prior Treatment) HPI   69 year old female with left shoulder pain. Patient is a history of multiple sclerosis. She reports previous exacerbations with manifestation of left shoulder pain. She is followed by neurology, Dr. Jannifer Franklin. Is on Betaseron injections. Her symptoms worsening for the past day or so. She denies any trauma. Baseline weakness in her right lower extremity and some gait instability secondary tenderness. She denies any acute change. No acute visual complaints.   Past Medical History  Diagnosis Date  . Multiple sclerosis   . Hypertension   . Hypercholesteremia   . Arthritis   . Hypothyroidism   . Abnormality of gait 01/25/2013  . Neurogenic bladder 01/25/2013  . GERD (gastroesophageal reflux disease)    Past Surgical History  Procedure Laterality Date  . Abdominal hysterectomy     Family History  Problem Relation Age of Onset  . Heart attack Mother   . Multiple sclerosis Sister   . Heart disease Sister   . Diabetes Other    History  Substance Use Topics  . Smoking status: Never Smoker   . Smokeless tobacco: Not on file     Comment: quit 30-40 years ago (documented in 2013)  . Alcohol Use: No   OB History    No data available     Review of Systems  All systems reviewed and negative, other than as noted in HPI.   Allergies  Sulfa antibiotics  Home Medications   Prior to Admission medications   Medication Sig Start Date End Date Taking? Authorizing Provider  acetaminophen (TYLENOL) 500 MG tablet Take 1,000 mg by mouth every 6 (six) hours as needed. For pain   Yes Historical Provider, MD  BETASERON 0.3 MG KIT injection INJECT 0.25MG             SUBCUTANEOUSLY EVERY      OTHER DAY  02/26/14  Yes Kathrynn Ducking, MD  diclofenac (VOLTAREN) 75 MG EC tablet TAKE 1 TABLET TWICE DAILY 10/18/13  Yes Kathrynn Ducking, MD  hydrochlorothiazide (HYDRODIURIL) 25 MG tablet Take 25 mg by mouth daily.   Yes Historical Provider, MD  levothyroxine (SYNTHROID, LEVOTHROID) 50 MCG tablet Take 50 mcg by mouth daily.   Yes Historical Provider, MD  Multiple Vitamin (MULTIVITAMIN WITH MINERALS) TABS Take 1 tablet by mouth daily.   Yes Historical Provider, MD  omeprazole (PRILOSEC) 20 MG capsule Take 20 mg by mouth daily.   Yes Historical Provider, MD  oxybutynin (DITROPAN) 5 MG tablet Take 5 mg by mouth at bedtime.    Yes Historical Provider, MD  pseudoephedrine (SUDAFED) 120 MG 12 hr tablet Take 120 mg by mouth 2 (two) times daily as needed for congestion.   Yes Historical Provider, MD  rosuvastatin (CRESTOR) 20 MG tablet Take 10 mg by mouth daily.    Yes Historical Provider, MD   BP 197/63 mmHg  Pulse 106  Temp(Src) 98.2 F (36.8 C) (Oral)  Resp 20  SpO2 99% Physical Exam  Constitutional: She is oriented to person, place, and time. She appears well-developed and well-nourished. No distress.  HENT:  Head: Normocephalic and atraumatic.  Eyes: Conjunctivae are normal. Right eye exhibits no discharge. Left eye exhibits no discharge.  Neck: Neck supple.  Cardiovascular: Normal rate,  regular rhythm and normal heart sounds.  Exam reveals no gallop and no friction rub.   No murmur heard. Pulmonary/Chest: Effort normal and breath sounds normal. No respiratory distress.  Abdominal: Soft. She exhibits no distension. There is no tenderness.  Musculoskeletal: She exhibits no edema or tenderness.  Neurological: She is alert and oriented to person, place, and time. No cranial nerve deficit.  4/5 strength RLE. Normal strength elsewhere. Good finger to nose b/l.   Skin: Skin is warm and dry.  Psychiatric: She has a normal mood and affect. Her behavior is normal. Thought content normal.  Nursing note  and vitals reviewed.   ED Course  Procedures (including critical care time) Labs Review Labs Reviewed  I-STAT CHEM 8, ED - Abnormal; Notable for the following:    Glucose, Bld 126 (*)    Calcium, Ion 1.07 (*)    All other components within normal limits    Imaging Review No results found.   EKG Interpretation None      MDM   Final diagnoses:  Left shoulder pain  Multiple sclerosis    68yF with L shoulder pain which apparently is symptom she has typically with MS exacerbation. Spoke with Dr Roxy Horseman, neurology. Recommending 552m solumedrol and FU in office tommorow.     SVirgel Manifold MD 04/10/14 [phone]

## 2014-04-08 ENCOUNTER — Encounter: Payer: Self-pay | Admitting: Adult Health

## 2014-04-08 ENCOUNTER — Ambulatory Visit (INDEPENDENT_AMBULATORY_CARE_PROVIDER_SITE_OTHER): Payer: Medicare HMO | Admitting: Adult Health

## 2014-04-08 ENCOUNTER — Ambulatory Visit (INDEPENDENT_AMBULATORY_CARE_PROVIDER_SITE_OTHER): Payer: Self-pay | Admitting: Neurology

## 2014-04-08 VITALS — BP 154/78 | HR 91 | Ht 64.0 in | Wt 204.6 lb

## 2014-04-08 VITALS — BP 156/69 | HR 85

## 2014-04-08 DIAGNOSIS — R269 Unspecified abnormalities of gait and mobility: Secondary | ICD-10-CM | POA: Diagnosis not present

## 2014-04-08 DIAGNOSIS — G35 Multiple sclerosis: Secondary | ICD-10-CM | POA: Diagnosis not present

## 2014-04-08 DIAGNOSIS — Z0289 Encounter for other administrative examinations: Secondary | ICD-10-CM

## 2014-04-08 MED ORDER — SODIUM CHLORIDE 0.9 % IV SOLN
500.0000 mg | Freq: Once | INTRAVENOUS | Status: AC
  Administered 2014-04-08: 500 mg via INTRAVENOUS

## 2014-04-08 MED ORDER — SODIUM CHLORIDE 0.9 % IV SOLN: 500.0000 mg | Freq: Once | INTRAVENOUS | Status: DC

## 2014-04-08 NOTE — Progress Notes (Signed)
Patient here see NP Butch Penny.  Order for Solumedrol 500mg  IV.  Patient to treatment room with daughter.  IV started in right AC, unsuccessful, second attempt in right forearm, good blood return, 24g angiocath.   Solumedrol 500mg /100cc 0.9% NaCl started at 0935.  Infusion completed at 0953, vitals done at 0956 BP 156/69 and HR 85.  IV discontinued and removed.  Patient to checkout in wheelchair with daughter in NAD.  Solumedrol NDC  [id number].

## 2014-04-08 NOTE — Patient Instructions (Signed)
Patient will return tomorrow for one more Solumedrol 500mg  IV.

## 2014-04-08 NOTE — Progress Notes (Signed)
I have read the note, and I agree with the clinical assessment and plan.  WILLIS,CHARLES KEITH   

## 2014-04-08 NOTE — Progress Notes (Signed)
PATIENT: Jillian HADSALL DOB: Aug 05, 1945  REASON FOR VISIT: follow up HISTORY FROM: patient  HISTORY OF PRESENT ILLNESS: [name] is a 69 year old female with a history of multiple sclerosis. She returns today for follow-up. She is currently taking Betaseron and tolerating it well. She states that she had an MS attack yesterday. States that she could not stand or get out of bed. She went to the ED and received steroid infusion and she has since improved. She has not returned to baseline but is much better than yesterday. She continues to have trouble walking due to the right leg.  No changes in her bowels or bladder.  She ambulates with a walker. Denies any recent falls. No changes in vision. The patient's daughter states that up until yesterday the patient had gone "years" without an attack.  HISTORY 10/03/13 (WILLIS): 69 year old right-handed black female with a history of multiple sclerosis. 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 The patient has been on Betaseron, and she has tolerated the medication well. She has noted no progression in her deficits since last seen. The last MRI evaluation that she obtain was in February of 2014. The patient has brain and spinal cord lesions. She denies any change in vision, strength, bowel bladder control. She does report some slight numbness of the left hand. The shoulder discomfort she had on her last visit has improved. She returns for an evaluation. She walks with a walker, but she denies any falls since last seen  REVIEW OF SYSTEMS: Out of a complete 14 system review of symptoms, the patient complains only of the following symptoms, and all other reviewed systems are negative.  Weakness, joint pain  ALLERGIES: Allergies  Allergen Reactions  . Sulfa Antibiotics Rash    HOME MEDICATIONS: Outpatient Prescriptions Prior to Visit  Medication Sig Dispense Refill  . acetaminophen (TYLENOL) 500 MG tablet Take  1,000 mg by mouth every 6 (six) hours as needed. For pain    . BETASERON 0.3 MG KIT injection INJECT 0.25MG             SUBCUTANEOUSLY EVERY      OTHER DAY 14 kit 6  . diclofenac (VOLTAREN) 75 MG EC tablet TAKE 1 TABLET TWICE DAILY 60 tablet 6  . hydrochlorothiazide (HYDRODIURIL) 25 MG tablet Take 25 mg by mouth daily.    Marland Kitchen levothyroxine (SYNTHROID, LEVOTHROID) 50 MCG tablet Take 50 mcg by mouth daily.    . Multiple Vitamin (MULTIVITAMIN WITH MINERALS) TABS Take 1 tablet by mouth daily.    Marland Kitchen omeprazole (PRILOSEC) 20 MG capsule Take 20 mg by mouth daily.    Marland Kitchen oxybutynin (DITROPAN) 5 MG tablet Take 5 mg by mouth at bedtime.     . pseudoephedrine (SUDAFED) 120 MG 12 hr tablet Take 120 mg by mouth 2 (two) times daily as needed for congestion.    . rosuvastatin (CRESTOR) 20 MG tablet Take 10 mg by mouth daily.     . traMADol (ULTRAM) 50 MG tablet Take 1 tablet (50 mg total) by mouth every 6 (six) hours as needed. 10 tablet 0   No facility-administered medications prior to visit.    PAST MEDICAL HISTORY: Past Medical History  Diagnosis Date  . Multiple sclerosis   . Hypertension   . Hypercholesteremia   . Arthritis   . Hypothyroidism   . Abnormality of gait 01/25/2013  . Neurogenic bladder 01/25/2013  . GERD (gastroesophageal reflux disease)  PAST SURGICAL HISTORY: Past Surgical History  Procedure Laterality Date  . Abdominal hysterectomy      FAMILY HISTORY: Family History  Problem Relation Age of Onset  . Heart attack Mother   . Multiple sclerosis Sister   . Heart disease Sister   . Diabetes Other     SOCIAL HISTORY: History   Social History  . Marital Status: Married    Spouse Name: james    Number of Children: 2  . Years of Education: N/A   Occupational History  . retired    Social History Main Topics  . Smoking status: Never Smoker   . Smokeless tobacco: Not on file     Comment: quit 30-40 years ago (documented in 2013)  . Alcohol Use: No  . Drug Use: No    . Sexual Activity: Not on file   Other Topics Concern  . Not on file   Social History Narrative      PHYSICAL EXAM  Filed Vitals:   04/08/14 0831 04/08/14 0914  BP: 184/81 154/78  Pulse: 101 91  Height: '5\' 4"'  (1.626 m)   Weight: 204 lb 9.6 oz (92.806 kg)    Body mass index is 35.1 kg/(m^2).  Generalized: Well developed, in no acute distress   Neurological examination  Mentation: Alert oriented to time, place, history taking. Follows all commands speech and language fluent Cranial nerve II-XII: Pupils were equal round reactive to light. Extraocular movements were full, visual field were full on confrontational test. Facial sensation and strength were normal. Uvula tongue midline. Head turning and shoulder shrug  were normal and symmetric. Motor: The motor testing reveals 5 over 5 strength of all 4 extremities except 4/5 in the right lower extremity. She does have right ankle weakness. Good symmetric motor tone is noted throughout.  Sensory: Sensory testing is intact to soft touch on all 4 extremities. No evidence of extinction is noted.  Coordination: Cerebellar testing reveals good finger-nose-finger and heel-to-shin bilaterally.  Gait and station: Patient is unable to stand from a sitting position without assistance. The patient's has a shuffling gait slightly dragging the right foot. She uses a walker to ambulate. Tandem gait not attempted. Romberg is negative but unsteady..  Reflexes: Deep tendon reflexes are symmetric and normal bilaterally.    DIAGNOSTIC DATA (LABS, IMAGING, TESTING) - I reviewed patient records, labs, notes, testing and imaging myself where available.  Lab Results  Component Value Date   HGB 14.3 04/07/2014   HCT 42.0 04/07/2014      Component Value Date/Time   NA 140 04/07/2014 1256   K 3.9 04/07/2014 1256   CL 103 04/07/2014 1256   GLUCOSE 126* 04/07/2014 1256   BUN 13 04/07/2014 1256   CREATININE 0.80 04/07/2014 1256      ASSESSMENT AND  PLAN 69 y.o. year old female  has a past medical history of Multiple sclerosis; Hypertension; Hypercholesteremia; Arthritis; Hypothyroidism; Abnormality of gait (01/25/2013); Neurogenic bladder (01/25/2013); and GERD (gastroesophageal reflux disease). here with:  1. Multiple sclerosis 2. MS exacerbation 3. Abnormality of gait  The patient had a MS exacerbation that started yesterday. She was taken to the emergency room and received 500 mg of methylprednisone. She states that her symptoms have improved. She is able to walk now however she has not returned to her baseline. I will do 2 additional doses of methylprednisone. She will receive 500 mg of methylprednisone today and then again tomorrow. She will let me know if her symptoms improve. I will also recheck  a MRI of the brain and cervical spine. For now she will continue Betaseron. If her symptoms worsen or she develops new symptoms she should let us know. Otherwise she will follow up in 3-4 months.  Ward Givens, MSN, NP-C 04/08/2014, 8:28 AM [facility] [address] [place], [place] [address] [phone]  Note: This document was prepared with digital dictation and possible smart phrase technology. Any transcriptional errors that result from this process are unintentional.

## 2014-04-08 NOTE — Patient Instructions (Signed)
You will receive an additional dose of solumedrol today and tomorrow.  Please let us know if your symptoms do not improve. We will recheck MRI of brain and spine.

## 2014-04-09 ENCOUNTER — Telehealth: Payer: Self-pay | Admitting: Neurology

## 2014-04-09 ENCOUNTER — Telehealth: Payer: Self-pay | Admitting: Adult Health

## 2014-04-09 ENCOUNTER — Ambulatory Visit (INDEPENDENT_AMBULATORY_CARE_PROVIDER_SITE_OTHER): Payer: Self-pay | Admitting: *Deleted

## 2014-04-09 DIAGNOSIS — G35 Multiple sclerosis: Secondary | ICD-10-CM

## 2014-04-09 DIAGNOSIS — Z0289 Encounter for other administrative examinations: Secondary | ICD-10-CM

## 2014-04-09 NOTE — Telephone Encounter (Signed)
paient's daughter called me on Sunday, stating she received a phone call - her mother list function of the left side, numb and weak. Asked to bring her to ED and have ED cal me, Dr Cathren Laine called me back, asked if patient can get Solumdrol in the office this week,  He felt this was a shoulder pian , not MS attack. Asked to have patient call [facility] and set her up for evaluation and possible solumedrol, c spine MRI . Patient of dr Anne Hahn.  She was seen by Butch Penny.

## 2014-04-09 NOTE — Telephone Encounter (Signed)
The patient is here today for her third treatment of Solu-Medrol. The patient feels that she is back to her baseline today she was able to walk to the car this morning without any assistance. She also states that she was able to get up and cook herself breakfast. Her daughter is with her and agree that she has returned her baseline. Her blood pressure is also elevated today. It is 208/84. The patient states that she is  not taking blood pressure medication.We will not do the last treatment of Solu-Medrol due to the patient's blood pressure and that she feels that she is returned to her baseline. I have advised the patient and her daughter to call her primary care provider and let them know what her blood pressure is so they can consider medication. The patient and her daughter verbalized understanding.

## 2014-04-09 NOTE — Progress Notes (Signed)
Pt and daughter here for solumedrol infusion.  VS taken Bp R lower forearm 208/75, redid manually L upper forearm 206/78 Pulse-72.  Relayed to Dolores Hoose, NP and will retake after and then see how she is.  Pt at clinical baseline prior to MS exacerbation.  Does not take HCT per pt.  Is on med list daughter unaware, thought pt taking this.  She will see her pcp (phone or go by after leaving here to see about evaluation).  Retook Bp L arm, manually 208/84.  No solumedrol today.  Will call us as needed.

## 2014-05-28 ENCOUNTER — Ambulatory Visit
Admission: RE | Admit: 2014-05-28 | Discharge: 2014-05-28 | Disposition: A | Discharge status: Home / Self Care | Payer: Commercial Managed Care - HMO | Source: Ambulatory Visit | Attending: Adult Health | Admitting: Adult Health

## 2014-05-28 DIAGNOSIS — G35 Multiple sclerosis: Secondary | ICD-10-CM

## 2014-05-28 MED ORDER — GADOBENATE DIMEGLUMINE 529 MG/ML IV SOLN
19.0000 mL | Freq: Once | INTRAVENOUS | Status: AC | PRN
  Administered 2014-05-28: 19 mL via INTRAVENOUS

## 2014-06-19 DIAGNOSIS — H2511 Age-related nuclear cataract, right eye: Secondary | ICD-10-CM | POA: Diagnosis not present

## 2014-06-19 DIAGNOSIS — H35363 Drusen (degenerative) of macula, bilateral: Secondary | ICD-10-CM | POA: Diagnosis not present

## 2014-06-19 DIAGNOSIS — H25012 Cortical age-related cataract, left eye: Secondary | ICD-10-CM | POA: Diagnosis not present

## 2014-06-19 DIAGNOSIS — H35033 Hypertensive retinopathy, bilateral: Secondary | ICD-10-CM | POA: Diagnosis not present

## 2014-06-19 DIAGNOSIS — H25011 Cortical age-related cataract, right eye: Secondary | ICD-10-CM | POA: Diagnosis not present

## 2014-06-19 DIAGNOSIS — H2512 Age-related nuclear cataract, left eye: Secondary | ICD-10-CM | POA: Diagnosis not present

## 2014-07-07 ENCOUNTER — Other Ambulatory Visit: Payer: Self-pay | Admitting: Neurology

## 2014-07-09 ENCOUNTER — Encounter: Payer: Self-pay | Admitting: Adult Health

## 2014-07-09 ENCOUNTER — Ambulatory Visit (INDEPENDENT_AMBULATORY_CARE_PROVIDER_SITE_OTHER): Payer: Commercial Managed Care - HMO | Admitting: Adult Health

## 2014-07-09 VITALS — BP 180/101 | HR 72 | Ht 64.0 in | Wt 208.0 lb

## 2014-07-09 DIAGNOSIS — G35 Multiple sclerosis: Secondary | ICD-10-CM | POA: Diagnosis not present

## 2014-07-09 DIAGNOSIS — Z5181 Encounter for therapeutic drug level monitoring: Secondary | ICD-10-CM | POA: Diagnosis not present

## 2014-07-09 DIAGNOSIS — R269 Unspecified abnormalities of gait and mobility: Secondary | ICD-10-CM | POA: Diagnosis not present

## 2014-07-09 NOTE — Progress Notes (Signed)
PATIENT: Jillian Fisher DOB: 1946-03-04  REASON FOR VISIT: follow up- multiple sclerosis HISTORY FROM: patient  HISTORY OF PRESENT ILLNESS: Jillian Fisher is a 69 year old female with a history multiple sclerosis and abnormality of gait. She returns today for follow-up. She is currently taking Betaseron and tolerating it well. She denies any new numbness or weakness. She does state that she has ongoing numbness in the hands. Denies any changes with her balance or gait. She uses a walker to ambulate. Daughter has noticed that she cannot ambulate for long distances without becoming fatigued. Denies any recent falls. Denies any changes with her bowels or bladder. She does state that she was diagnosed with cataracts and will hopefully have surgery at the beginning of next year. Overall the patient states that she's been doing very well. Denies any new medical issues. Denies any new neurological issues. She returns today for evaluation  HISTORY 04/08/14: Jillian Fisher is a 69 year old female with a history of multiple sclerosis. She returns today for follow-up. She is currently taking Betaseron and tolerating it well. She states that she had an MS attack yesterday. States that she could not stand or get out of bed. She went to the ED and received steroid infusion and she has since improved. She has not returned to baseline but is much better than yesterday. She continues to have trouble walking due to the right leg. No changes in her bowels or bladder. She ambulates with a walker. Denies any recent falls. No changes in vision. The patient's daughter states that up until yesterday the patient had gone "years" without an attack.  HISTORY 10/03/13 (WILLIS): 69 year old right-handed black female with a history of multiple sclerosis. 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 The patient has been on Betaseron, and she has tolerated the medication well. She has noted no  progression in her deficits since last seen. The last MRI evaluation that she obtain was in February of 2014. The patient has brain and spinal cord lesions. She denies any change in vision, strength, bowel bladder control. She does report some slight numbness of the left hand. The shoulder discomfort she had on her last visit has improved. She returns for an evaluation. She walks with a walker, but she denies any falls since last seen  REVIEW OF SYSTEMS: Out of a complete 14 system review of symptoms, the patient complains only of the following symptoms, and all other reviewed systems are negative.  Numbness  ALLERGIES: Allergies  Allergen Reactions  . Sulfa Antibiotics Rash    HOME MEDICATIONS: Outpatient Prescriptions Prior to Visit  Medication Sig Dispense Refill  . acetaminophen (TYLENOL) 500 MG tablet Take 1,000 mg by mouth every 6 (six) hours as needed. For pain    . BETASERON 0.3 MG KIT injection INJECT 0.25MG             SUBCUTANEOUSLY EVERY      OTHER DAY 14 kit 6  . diclofenac (VOLTAREN) 75 MG EC tablet TAKE 1 TABLET TWICE DAILY 60 tablet 6  . hydrochlorothiazide (HYDRODIURIL) 25 MG tablet Take 25 mg by mouth daily.    Marland Kitchen levothyroxine (SYNTHROID, LEVOTHROID) 50 MCG tablet Take 50 mcg by mouth daily.    . Multiple Vitamin (MULTIVITAMIN WITH MINERALS) TABS Take 1 tablet by mouth daily.    Marland Kitchen omeprazole (PRILOSEC) 20 MG capsule Take 20 mg by mouth daily.    Marland Kitchen oxybutynin (DITROPAN) 5 MG tablet Take 5 mg by  mouth at bedtime.     . rosuvastatin (CRESTOR) 20 MG tablet Take 10 mg by mouth daily.     . pseudoephedrine (SUDAFED) 120 MG 12 hr tablet Take 120 mg by mouth 2 (two) times daily as needed for congestion.    . traMADol (ULTRAM) 50 MG tablet Take 1 tablet (50 mg total) by mouth every 6 (six) hours as needed. 10 tablet 0   Facility-Administered Medications Prior to Visit  Medication Dose Route Frequency Provider Last Rate Last Dose  . methylPREDNISolone sodium succinate  (SOLU-MEDROL) 500 mg in sodium chloride 0.9 % 50 mL IVPB  500 mg Intravenous Once Trudie Buckler, NP        PAST MEDICAL HISTORY: Past Medical History  Diagnosis Date  . Multiple sclerosis   . Hypertension   . Hypercholesteremia   . Arthritis   . Hypothyroidism   . Abnormality of gait 01/25/2013  . Neurogenic bladder 01/25/2013  . GERD (gastroesophageal reflux disease)     PAST SURGICAL HISTORY: Past Surgical History  Procedure Laterality Date  . Abdominal hysterectomy      FAMILY HISTORY: Family History  Problem Relation Age of Onset  . Heart attack Mother   . Multiple sclerosis Sister   . Heart disease Sister   . Diabetes Other     SOCIAL HISTORY: History   Social History  . Marital Status: Married    Spouse Name: [name]  . Number of Children: 2  . Years of Education: 8 th   Occupational History  . retired    Social History Main Topics  . Smoking status: Never Smoker   . Smokeless tobacco: Never Used     Comment: quit 30-40 years ago (documented in 2013)  . Alcohol Use: No  . Drug Use: No  . Sexual Activity: Not on file   Other Topics Concern  . Not on file   Social History Narrative   Patient lives at home with her husband.   Retired.   Education 8 th grade   Right handed.   Caffeine none.      PHYSICAL EXAM  Filed Vitals:   07/09/14 1029  BP: 180/101  Pulse: 72  Height: '5\' 4"'  (1.626 m)  Weight: 208 lb (94.348 kg)   Body mass index is 35.69 kg/(m^2).  Generalized: Well developed, in no acute distress   Neurological examination  Mentation: Alert oriented to time, place, history taking. Follows all commands speech and language fluent Cranial nerve II-XII: Pupils were equal round reactive to light. Extraocular movements were full, visual field were full on confrontational test. Facial sensation and strength were normal. Uvula tongue midline. Head turning and shoulder shrug  were normal and symmetric. Motor: The motor testing reveals 5  over 5 strength of all 4 extremities. Good symmetric motor tone is noted throughout.  Sensory: Sensory testing is intact to soft touch on all 4 extremities. No evidence of extinction is noted.  Coordination: Cerebellar testing reveals good finger-nose-finger and heel-to-shin bilaterally.  Gait and station: Patient requires assistance when climbing onto the exam table. Her gait is unsteady without her walker. Tandem gait not attempted. Romberg is unsteady. Reflexes: Deep tendon reflexes are symmetric and normal bilaterally.    DIAGNOSTIC DATA (LABS, IMAGING, TESTING) - I reviewed patient records, labs, notes, testing and imaging myself where available.      ASSESSMENT AND PLAN 69 y.o. year old female  has a past medical history of Multiple sclerosis; Hypertension; Hypercholesteremia; Arthritis; Hypothyroidism; Abnormality of gait (01/25/2013); Neurogenic  bladder (01/25/2013); and GERD (gastroesophageal reflux disease). here with:  1. Multiple sclerosis 2. Abnormality of gait  Overall the patient has remained stable. She will continue taking Betaseron. I will check blood work today. Patient is currently using a walker to ambulate. The daughter is questioning whether a Rollator or wheelchair would be more beneficial for the patient. The  Daughter plans to check with the patient's insurance to see what they will cover. She will let me know at that time. If the patients symptoms worsen or she develops new symptoms she'll let us know. Otherwise she will follow-up in 6 months or sooner if needed.   Ward Givens, MSN, NP-C 07/09/2014, 10:44 AM [facility] [address], [place], [place] [address] [phone]  Note: This document was prepared with digital dictation and possible smart phrase technology. Any transcriptional errors that result from this process are unintentional.

## 2014-07-09 NOTE — Patient Instructions (Signed)
Continue to Betaseron  I will check blood work today.  Please call if you develop new symptoms.

## 2014-07-09 NOTE — Progress Notes (Signed)
I have read the note, and I agree with the clinical assessment and plan.  Jillian Fisher   

## 2014-07-10 LAB — CBC WITH DIFFERENTIAL/PLATELET
BASOS: 1 %
Basophils Absolute: 0.1 10*3/uL (ref 0.0–0.2)
Eos: 3 %
Eosinophils Absolute: 0.2 10*3/uL (ref 0.0–0.4)
HCT: 36.8 % (ref 34.0–46.6)
Hemoglobin: 11.6 g/dL (ref 11.1–15.9)
Immature Grans (Abs): 0 10*3/uL (ref 0.0–0.1)
Immature Granulocytes: 0 %
Lymphocytes Absolute: 2.3 10*3/uL (ref 0.7–3.1)
Lymphs: 36 %
MCH: 28.4 pg (ref 26.6–33.0)
MCHC: 31.5 g/dL (ref 31.5–35.7)
MCV: 90 fL (ref 79–97)
MONOS ABS: 0.6 10*3/uL (ref 0.1–0.9)
Monocytes: 10 %
NEUTROS ABS: 3.2 10*3/uL (ref 1.4–7.0)
Neutrophils Relative %: 50 %
Platelets: 293 10*3/uL (ref 150–379)
RBC: 4.09 x10E6/uL (ref 3.77–5.28)
RDW: 14 % (ref 12.3–15.4)
WBC: 6.3 10*3/uL (ref 3.4–10.8)

## 2014-07-10 LAB — COMPREHENSIVE METABOLIC PANEL
ALK PHOS: 60 IU/L (ref 39–117)
ALT: 12 IU/L (ref 0–32)
AST: 17 IU/L (ref 0–40)
Albumin/Globulin Ratio: 1.3 (ref 1.1–2.5)
Albumin: 4.1 g/dL (ref 3.6–4.8)
BILIRUBIN TOTAL: 0.2 mg/dL (ref 0.0–1.2)
BUN/Creatinine Ratio: 17 (ref 11–26)
BUN: 17 mg/dL (ref 8–27)
CO2: 23 mmol/L (ref 18–29)
Calcium: 9.7 mg/dL (ref 8.7–10.3)
Chloride: 103 mmol/L (ref 97–108)
Creatinine, Ser: 1 mg/dL (ref 0.57–1.00)
GFR calc Af Amer: 67 mL/min/{1.73_m2} (ref >59)
GFR calc non Af Amer: 58 mL/min/{1.73_m2} — ABNORMAL LOW (ref >59)
Globulin, Total: 3.2 g/dL (ref 1.5–4.5)
Glucose: 118 mg/dL — ABNORMAL HIGH (ref 65–99)
Potassium: 4.2 mmol/L (ref 3.5–5.2)
Sodium: 142 mmol/L (ref 134–144)
Total Protein: 7.3 g/dL (ref 6.0–8.5)

## 2014-07-10 NOTE — Progress Notes (Signed)
Quick Note: ° °Called and spoke to patient relayed labs were normal. Patient understood. °______ °

## 2014-08-12 ENCOUNTER — Telehealth: Payer: Self-pay | Admitting: Neurology

## 2014-08-12 MED ORDER — INTERFERON BETA-1B 0.3 MG ~~LOC~~ KIT: 0.2500 mg | PACK | SUBCUTANEOUS | Status: DC

## 2014-08-12 NOTE — Telephone Encounter (Signed)
Patients daughter called and requested the patients Rx. BETASERON 0.3 MG KIT injection be sent to [facility] Fax [phone], [place] ID [id number]. Please call and advise.

## 2014-08-12 NOTE — Telephone Encounter (Signed)
Rx has been sent.  Receipt confirmed by pharmacy.  I called back to advise.  They are aware.  

## 2014-09-27 DIAGNOSIS — H40023 Open angle with borderline findings, high risk, bilateral: Secondary | ICD-10-CM | POA: Diagnosis not present

## 2014-10-24 DIAGNOSIS — H401232 Low-tension glaucoma, bilateral, moderate stage: Secondary | ICD-10-CM | POA: Diagnosis not present

## 2014-10-24 DIAGNOSIS — H1851 Endothelial corneal dystrophy: Secondary | ICD-10-CM | POA: Diagnosis not present

## 2014-12-16 DIAGNOSIS — H2512 Age-related nuclear cataract, left eye: Secondary | ICD-10-CM | POA: Diagnosis not present

## 2014-12-16 DIAGNOSIS — H401232 Low-tension glaucoma, bilateral, moderate stage: Secondary | ICD-10-CM | POA: Diagnosis not present

## 2014-12-16 DIAGNOSIS — H1851 Endothelial corneal dystrophy: Secondary | ICD-10-CM | POA: Diagnosis not present

## 2014-12-16 DIAGNOSIS — H25012 Cortical age-related cataract, left eye: Secondary | ICD-10-CM | POA: Diagnosis not present

## 2014-12-16 DIAGNOSIS — H35363 Drusen (degenerative) of macula, bilateral: Secondary | ICD-10-CM | POA: Diagnosis not present

## 2015-01-09 ENCOUNTER — Encounter: Payer: Self-pay | Admitting: Adult Health

## 2015-01-09 ENCOUNTER — Ambulatory Visit (INDEPENDENT_AMBULATORY_CARE_PROVIDER_SITE_OTHER): Payer: Commercial Managed Care - HMO | Admitting: Adult Health

## 2015-01-09 VITALS — BP 174/69 | HR 62 | Ht 64.0 in | Wt 210.0 lb

## 2015-01-09 DIAGNOSIS — Z5181 Encounter for therapeutic drug level monitoring: Secondary | ICD-10-CM | POA: Diagnosis not present

## 2015-01-09 DIAGNOSIS — R269 Unspecified abnormalities of gait and mobility: Secondary | ICD-10-CM | POA: Diagnosis not present

## 2015-01-09 DIAGNOSIS — G35 Multiple sclerosis: Secondary | ICD-10-CM | POA: Diagnosis not present

## 2015-01-09 NOTE — Progress Notes (Signed)
I have read the note, and I agree with the clinical assessment and plan.  Tatiana Courter KEITH   

## 2015-01-09 NOTE — Patient Instructions (Signed)
Continue Betaseron I will check blood work today If your symptoms worsen or you develop new symptoms please let us know.   

## 2015-01-09 NOTE — Progress Notes (Signed)
PATIENT: Jillian Fisher DOB: 01/29/1946  REASON FOR VISIT: follow up- multiple sclerosis, abnormality of gait  HISTORY FROM: patient  HISTORY OF PRESENT ILLNESS: Jillian Fisher is a 69 year old female with a history of multiple sclerosis and abnormality of gait. She returns today for follow-up. The patient continues to take Betaseron and tolerates it well. She denies any new numbness or weakness. She continues to use a walker when ambulating. Denies any significant changes with her gait or balance. She did have one fall after tripping over a throw rug in her house. Fortunately she did not suffer any injuries. She was not using her walker at the time. Denies any changes with her bowels or bladder. The patient does have cataracts that affect her vision. She plans to have surgery in January. Overall the patient feels that she has done very well. Denies any new neurological symptoms. She returns today for an evaluation.  HISTORY 07/09/14: [name] is a 69 year old female with a history multiple sclerosis and abnormality of gait. She returns today for follow-up. She is currently taking Betaseron and tolerating it well. She denies any new numbness or weakness. She does state that she has ongoing numbness in the hands. Denies any changes with her balance or gait. She uses a walker to ambulate. Daughter has noticed that she cannot ambulate for long distances without becoming fatigued. Denies any recent falls. Denies any changes with her bowels or bladder. She does state that she was diagnosed with cataracts and will hopefully have surgery at the beginning of next year. Overall the patient states that she's been doing very well. Denies any new medical issues. Denies any new neurological issues. She returns today for evaluation   REVIEW OF SYSTEMS: Out of a complete 14 system review of symptoms, the patient complains only of the following symptoms, and all other reviewed systems are negative.  Ringing in  ears, walking difficulty, weakness, environmental allergies  ALLERGIES: Allergies  Allergen Reactions  . Sulfa Antibiotics Rash    HOME MEDICATIONS: Outpatient Prescriptions Prior to Visit  Medication Sig Dispense Refill  . acetaminophen (TYLENOL) 500 MG tablet Take 1,000 mg by mouth every 6 (six) hours as needed. For pain    . diclofenac (VOLTAREN) 75 MG EC tablet TAKE 1 TABLET TWICE DAILY 60 tablet 6  . DiphenhydrAMINE HCl (BENADRYL ALLERGY PO) Take by mouth as needed.    . hydrochlorothiazide (HYDRODIURIL) 25 MG tablet Take 25 mg by mouth daily.    . Interferon Beta-1b (BETASERON) 0.3 MG KIT injection Inject 0.25 mg into the skin every other day. 1 kit 6  . levothyroxine (SYNTHROID, LEVOTHROID) 50 MCG tablet Take 50 mcg by mouth daily.    . Multiple Vitamin (MULTIVITAMIN WITH MINERALS) TABS Take 1 tablet by mouth daily.    Marland Kitchen omeprazole (PRILOSEC) 20 MG capsule Take 20 mg by mouth daily.    Marland Kitchen oxybutynin (DITROPAN) 5 MG tablet Take 5 mg by mouth at bedtime.     . rosuvastatin (CRESTOR) 20 MG tablet Take 10 mg by mouth daily.      Facility-Administered Medications Prior to Visit  Medication Dose Route Frequency Provider Last Rate Last Dose  . methylPREDNISolone sodium succinate (SOLU-MEDROL) 500 mg in sodium chloride 0.9 % 50 mL IVPB  500 mg Intravenous Once Ward Givens, NP        PAST MEDICAL HISTORY: Past Medical History  Diagnosis Date  . Multiple sclerosis   . Hypertension   . Hypercholesteremia   . Arthritis   .  Hypothyroidism   . Abnormality of gait 01/25/2013  . Neurogenic bladder 01/25/2013  . GERD (gastroesophageal reflux disease)     PAST SURGICAL HISTORY: Past Surgical History  Procedure Laterality Date  . Abdominal hysterectomy      FAMILY HISTORY: Family History  Problem Relation Age of Onset  . Heart attack Mother   . Multiple sclerosis Sister   . Heart disease Sister   . Diabetes Other     SOCIAL HISTORY: Social History   Social History  .  Marital Status: Married    Spouse Name: [name]  . Number of Children: 2  . Years of Education: 8 th   Occupational History  . retired    Social History Main Topics  . Smoking status: Never Smoker   . Smokeless tobacco: Never Used     Comment: quit 30-40 years ago (documented in 2013)  . Alcohol Use: No  . Drug Use: No  . Sexual Activity: Not on file   Other Topics Concern  . Not on file   Social History Narrative   Patient lives at home with her husband.   Retired.   Education 8 th grade   Right handed.   Caffeine none.      PHYSICAL EXAM  Filed Vitals:   01/09/15 0840  BP: 174/69  Pulse: 62  Height: _0  (1.626 m)  Weight: 210 lb (95.255 kg)   Body mass index is 36.03 kg/(m^2).  Generalized: Well developed, in no acute distress   Neurological examination  Mentation: Alert oriented to time, place, history taking. Follows all commands speech and language fluent Cranial nerve II-XII: Pupils were equal round reactive to light. Extraocular movements were full, visual field were full on confrontational test. Facial sensation and strength were normal. Uvula tongue midline. Head turning and shoulder shrug  were normal and symmetric. Motor: The motor testing reveals 5 over 5 strength of all 4 extremities. Good symmetric motor tone is noted throughout.  Sensory: Sensory testing is intact to soft touch on all 4 extremities. No evidence of extinction is noted.  Coordination: Cerebellar testing reveals good finger-nose-finger and heel-to-shin bilaterally.  Gait and station: Uses a walker January. Occasionally dragging the right leg. Tandem gait not attended. Romberg is negative but unsteady. Reflexes: Deep tendon reflexes are symmetric and normal bilaterally.   DIAGNOSTIC DATA (LABS, IMAGING, TESTING) - I reviewed patient records, labs, notes, testing and imaging myself where available.  MRI brain with and without contrast 05/29/2014:  IMPRESSION:  Abnormal MRI brain  (with and without) demonstrating: 1. Moderate periventricular, subcortical, pericallosal, cervical medullary junction chronic demyelinating plaques. Some of these are hypointense on T1 views.  2. No abnormal lesions are seen on post contrast views.  3. No significant change from MRI on 05/15/12.  MRI cervical spine with and without contrast 05/29/2014:  IMPRESSION:  Abnormal MRI cervical spine (with and without) demonstrating: 1. Spinal cord is notable for T2 hyperintensities within the spinal cord at C2, C4, C5-6, T1 levels, consistent with chronic demyelinating plaques. 2. No acute plaques. 3. At C3-4: disc bulging with severe left foraminal stenosis. 4. At C4-5: disc bulging and facet hypertrophy with moderate right foraminal stenosis. 5. At C5-6: disc bulging and facet hypertrophy with mild right foraminal stenosis. 6. No significant change from MRI on 05/15/12.  Lab Results  Component Value Date   WBC 6.3 07/09/2014   HGB 11.6 07/09/2014   HCT 36.8 07/09/2014   MCV 90 07/09/2014   PLT 293 07/09/2014  Component Value Date/Time   NA 142 07/09/2014 1103   NA 140 04/07/2014 1256   K 4.2 07/09/2014 1103   CL 103 07/09/2014 1103   CO2 23 07/09/2014 1103   GLUCOSE 118* 07/09/2014 1103   GLUCOSE 126* 04/07/2014 1256   BUN 17 07/09/2014 1103   BUN 13 04/07/2014 1256   CREATININE 1.00 07/09/2014 1103   CALCIUM 9.7 07/09/2014 1103   PROT 7.3 07/09/2014 1103   AST 17 07/09/2014 1103   ALT 12 07/09/2014 1103   ALKPHOS 60 07/09/2014 1103   BILITOT 0.2 07/09/2014 1103   GFRNONAA 58* 07/09/2014 1103   GFRAA 67 07/09/2014 1103      ASSESSMENT AND PLAN 69 y.o. year old female  has a past medical history of Multiple sclerosis; Hypertension; Hypercholesteremia; Arthritis; Hypothyroidism; Abnormality of gait (01/25/2013); Neurogenic bladder (01/25/2013); and GERD (gastroesophageal reflux disease). here with:  1. Multiple sclerosis 2. Abnormality of gait  Overall the patient  is doing well. She will continue on Betaseron. The patient had MRIs earlier this year that did not show any significant changes. She is encouraged to continue to use her walker when ambulating. I will check blood work today. Patient advised that if her symptoms worsen or she develops any new symptoms she should let us know. She will follow-up in 6 months with Dr. Jannifer Franklin.   Ward Givens, MSN, NP-C 01/09/2015, 8:36 AM [facility] [address] [place], [place] [address] [phone]

## 2015-01-10 LAB — CBC WITH DIFFERENTIAL/PLATELET
Basophils Absolute: 0.1 10*3/uL (ref 0.0–0.2)
Basos: 1 %
EOS (ABSOLUTE): 0.2 10*3/uL (ref 0.0–0.4)
Eos: 3 %
Hematocrit: 36.6 % (ref 34.0–46.6)
Hemoglobin: 11.7 g/dL (ref 11.1–15.9)
IMMATURE GRANULOCYTES: 0 %
Immature Grans (Abs): 0 10*3/uL (ref 0.0–0.1)
LYMPHS ABS: 2.6 10*3/uL (ref 0.7–3.1)
Lymphs: 50 %
MCH: 26.8 pg (ref 26.6–33.0)
MCHC: 32 g/dL (ref 31.5–35.7)
MCV: 84 fL (ref 79–97)
Monocytes Absolute: 0.6 10*3/uL (ref 0.1–0.9)
Monocytes: 12 %
NEUTROS PCT: 34 %
Neutrophils Absolute: 1.7 10*3/uL (ref 1.4–7.0)
PLATELETS: 249 10*3/uL (ref 150–379)
RBC: 4.37 x10E6/uL (ref 3.77–5.28)
RDW: 15 % (ref 12.3–15.4)
WBC: 5.2 10*3/uL (ref 3.4–10.8)

## 2015-01-10 LAB — COMPREHENSIVE METABOLIC PANEL
ALT: 13 IU/L (ref 0–32)
AST: 21 IU/L (ref 0–40)
Albumin/Globulin Ratio: 1 — ABNORMAL LOW (ref 1.1–2.5)
Albumin: 4 g/dL (ref 3.6–4.8)
Alkaline Phosphatase: 61 IU/L (ref 39–117)
BILIRUBIN TOTAL: 0.3 mg/dL (ref 0.0–1.2)
BUN / CREAT RATIO: 15 (ref 11–26)
BUN: 15 mg/dL (ref 8–27)
CO2: 24 mmol/L (ref 18–29)
Calcium: 9.3 mg/dL (ref 8.7–10.3)
Chloride: 101 mmol/L (ref 97–108)
Creatinine, Ser: 1.02 mg/dL — ABNORMAL HIGH (ref 0.57–1.00)
GFR calc Af Amer: 65 mL/min/{1.73_m2} (ref >59)
GFR calc non Af Amer: 57 mL/min/{1.73_m2} — ABNORMAL LOW (ref >59)
Globulin, Total: 3.9 g/dL (ref 1.5–4.5)
Glucose: 108 mg/dL — ABNORMAL HIGH (ref 65–99)
POTASSIUM: 4.1 mmol/L (ref 3.5–5.2)
Sodium: 139 mmol/L (ref 134–144)
Total Protein: 7.9 g/dL (ref 6.0–8.5)

## 2015-01-13 ENCOUNTER — Telehealth: Payer: Self-pay

## 2015-01-13 NOTE — Telephone Encounter (Signed)
Spoke to patient. Gave lab results. Patient verbalized understanding.  

## 2015-01-13 NOTE — Telephone Encounter (Signed)
-----   Message from Butch Penny, NP sent at 01/13/2015  7:30 AM EDT ----- Lab work ok. Please call the patient.

## 2015-01-24 DIAGNOSIS — K2970 Gastritis, unspecified, without bleeding: Secondary | ICD-10-CM | POA: Diagnosis not present

## 2015-01-24 DIAGNOSIS — E039 Hypothyroidism, unspecified: Secondary | ICD-10-CM | POA: Diagnosis not present

## 2015-01-24 DIAGNOSIS — Z0000 Encounter for general adult medical examination without abnormal findings: Secondary | ICD-10-CM | POA: Diagnosis not present

## 2015-01-24 DIAGNOSIS — F411 Generalized anxiety disorder: Secondary | ICD-10-CM | POA: Diagnosis not present

## 2015-01-24 DIAGNOSIS — N3281 Overactive bladder: Secondary | ICD-10-CM | POA: Diagnosis not present

## 2015-01-24 DIAGNOSIS — E7800 Pure hypercholesterolemia, unspecified: Secondary | ICD-10-CM | POA: Diagnosis not present

## 2015-01-24 DIAGNOSIS — M25512 Pain in left shoulder: Secondary | ICD-10-CM | POA: Diagnosis not present

## 2015-01-24 DIAGNOSIS — M8589 Other specified disorders of bone density and structure, multiple sites: Secondary | ICD-10-CM | POA: Diagnosis not present

## 2015-01-30 DIAGNOSIS — Z1211 Encounter for screening for malignant neoplasm of colon: Secondary | ICD-10-CM | POA: Diagnosis not present

## 2015-02-04 ENCOUNTER — Other Ambulatory Visit: Payer: Self-pay | Admitting: Neurology

## 2015-02-04 NOTE — Telephone Encounter (Signed)
Originally prescribed at [facility] on 10/23

## 2015-02-10 ENCOUNTER — Other Ambulatory Visit: Payer: Self-pay

## 2015-02-10 MED ORDER — INTERFERON BETA-1B 0.3 MG ~~LOC~~ KIT: 0.2500 mg | PACK | SUBCUTANEOUS | Status: DC

## 2015-05-28 ENCOUNTER — Other Ambulatory Visit: Payer: Self-pay

## 2015-05-28 MED ORDER — DICLOFENAC SODIUM 75 MG PO TBEC: 75.0000 mg | DELAYED_RELEASE_TABLET | Freq: Two times a day (BID) | ORAL | Status: DC

## 2015-06-30 DIAGNOSIS — R030 Elevated blood-pressure reading, without diagnosis of hypertension: Secondary | ICD-10-CM | POA: Diagnosis not present

## 2015-07-10 ENCOUNTER — Ambulatory Visit: Payer: Commercial Managed Care - HMO | Admitting: Neurology

## 2015-08-02 DIAGNOSIS — Z1231 Encounter for screening mammogram for malignant neoplasm of breast: Secondary | ICD-10-CM | POA: Diagnosis not present

## 2015-08-20 ENCOUNTER — Other Ambulatory Visit: Payer: Self-pay

## 2015-08-20 ENCOUNTER — Telehealth: Payer: Self-pay | Admitting: *Deleted

## 2015-08-20 MED ORDER — INTERFERON BETA-1B 0.3 MG ~~LOC~~ KIT: 0.2500 mg | PACK | SUBCUTANEOUS | Status: DC

## 2015-08-20 NOTE — Telephone Encounter (Signed)
Returned TC and spoke to pt. She says that she no longer is using [facility]. Refills for Betaseron resent to [facility] as requested. Pt voiced appreciation for call.

## 2015-08-20 NOTE — Telephone Encounter (Signed)
[facility] DAUGHTER [id number] France Whitehouse WILLIS [phone] * 2045-12-24 HAS QUESTIONS REGARDING WHY A PRESCRIPTION WAS CALLED IN FOR MOTHER Y

## 2015-08-20 NOTE — Telephone Encounter (Signed)
Received faxed request from [facility] pharmacy for Betaseron refills. Retailed

## 2015-09-09 DIAGNOSIS — H401222 Low-tension glaucoma, left eye, moderate stage: Secondary | ICD-10-CM | POA: Diagnosis not present

## 2015-09-09 DIAGNOSIS — H1851 Endothelial corneal dystrophy: Secondary | ICD-10-CM | POA: Diagnosis not present

## 2015-09-09 DIAGNOSIS — H401212 Low-tension glaucoma, right eye, moderate stage: Secondary | ICD-10-CM | POA: Diagnosis not present

## 2015-09-22 ENCOUNTER — Ambulatory Visit: Payer: Commercial Managed Care - HMO | Admitting: Neurology

## 2015-09-30 ENCOUNTER — Encounter: Payer: Self-pay | Admitting: Neurology

## 2015-09-30 ENCOUNTER — Ambulatory Visit (INDEPENDENT_AMBULATORY_CARE_PROVIDER_SITE_OTHER): Payer: Commercial Managed Care - HMO | Admitting: Neurology

## 2015-09-30 VITALS — BP 192/86 | HR 82 | Ht 64.0 in | Wt 203.0 lb

## 2015-09-30 DIAGNOSIS — R269 Unspecified abnormalities of gait and mobility: Secondary | ICD-10-CM | POA: Diagnosis not present

## 2015-09-30 DIAGNOSIS — G35 Multiple sclerosis: Secondary | ICD-10-CM | POA: Diagnosis not present

## 2015-09-30 DIAGNOSIS — N319 Neuromuscular dysfunction of bladder, unspecified: Secondary | ICD-10-CM

## 2015-09-30 NOTE — Patient Instructions (Signed)
Fall Prevention in the Home  Falls can cause injuries and can affect people from all age groups. There are many simple things that you can do to make your home safe and to help prevent falls. WHAT CAN I DO ON THE OUTSIDE OF MY HOME?  Regularly repair the edges of walkways and driveways and fix any cracks.  Remove high doorway thresholds.  Trim any shrubbery on the main path into your home.  Use bright outdoor lighting.  Clear walkways of debris and clutter, including tools and rocks.  Regularly check that handrails are securely fastened and in good repair. Both sides of any steps should have handrails.  Install guardrails along the edges of any raised decks or porches.  Have leaves, snow, and ice cleared regularly.  Use sand or salt on walkways during winter months.  In the garage, clean up any spills right away, including grease or oil spills. WHAT CAN I DO IN THE BATHROOM?  Use night lights.  Install grab bars by the toilet and in the tub and shower. Do not use towel bars as grab bars.  Use non-skid mats or decals on the floor of the tub or shower.  If you need to sit down while you are in the shower, use a plastic, non-slip stool..  Keep the floor dry. Immediately clean up any water that spills on the floor.  Remove soap buildup in the tub or shower on a regular basis.  Attach bath mats securely with double-sided non-slip rug tape.  Remove throw rugs and other tripping hazards from the floor. WHAT CAN I DO IN THE BEDROOM?  Use night lights.  Make sure that a bedside light is easy to reach.  Do not use oversized bedding that drapes onto the floor.  Have a firm chair that has side arms to use for getting dressed.  Remove throw rugs and other tripping hazards from the floor. WHAT CAN I DO IN THE KITCHEN?   Clean up any spills right away.  Avoid walking on wet floors.  Place frequently used items in easy-to-reach places.  If you need to reach for something  above you, use a sturdy step stool that has a grab bar.  Keep electrical cables out of the way.  Do not use floor polish or wax that makes floors slippery. If you have to use wax, make sure that it is non-skid floor wax.  Remove throw rugs and other tripping hazards from the floor. WHAT CAN I DO IN THE STAIRWAYS?  Do not leave any items on the stairs.  Make sure that there are handrails on both sides of the stairs. Fix handrails that are broken or loose. Make sure that handrails are as long as the stairways.  Check any carpeting to make sure that it is firmly attached to the stairs. Fix any carpet that is loose or worn.  Avoid having throw rugs at the top or bottom of stairways, or secure the rugs with carpet tape to prevent them from moving.  Make sure that you have a light switch at the top of the stairs and the bottom of the stairs. If you do not have them, have them installed. WHAT ARE SOME OTHER FALL PREVENTION TIPS?  Wear closed-toe shoes that fit well and support your feet. Wear shoes that have rubber soles or low heels.  When you use a stepladder, make sure that it is completely opened and that the sides are firmly locked. Have someone hold the ladder while you   are using it. Do not climb a closed stepladder.  Add color or contrast paint or tape to grab bars and handrails in your home. Place contrasting color strips on the first and last steps.  Use mobility aids as needed, such as canes, walkers, scooters, and crutches.  Turn on lights if it is dark. Replace any light bulbs that burn out.  Set up furniture so that there are clear paths. Keep the furniture in the same spot.  Fix any uneven floor surfaces.  Choose a carpet design that does not hide the edge of steps of a stairway.  Be aware of any and all pets.  Review your medicines with your healthcare provider. Some medicines can cause dizziness or changes in blood pressure, which increase your risk of falling. Talk  with your health care provider about other ways that you can decrease your risk of falls. This may include working with a physical therapist or trainer to improve your strength, balance, and endurance.   This information is not intended to replace advice given to you by your health care provider. Make sure you discuss any questions you have with your health care provider.   Document Released: 03/12/2002 Document Revised: 08/06/2014 Document Reviewed: 04/26/2014 Elsevier Interactive Patient Education 2016 Elsevier Inc.  

## 2015-09-30 NOTE — Progress Notes (Signed)
Reason for visit: Multiple sclerosis  Jillian Fisher is an 70 y.o. female  History of present illness:  Jillian Fisher is a 70 year old right-handed black female with a history of multiple sclerosis on Betaseron. The patient is tolerating the medication quite well. She has not had any change in her clinical condition since last seen. She has had MRI evaluation of the brain done in February 2016, this showed no change from 2014. The patient reports no new numbness, vision changes, weakness, or changes in bowel or bladder function. She has a chronic gait disorder, she uses a cane for ambulation. 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 She denies any new medical issues that have come up since last seen.  Past Medical History  Diagnosis Date  . Multiple sclerosis ([place])   . Hypertension   . Hypercholesteremia   . Arthritis   . Hypothyroidism   . Abnormality of gait 01/25/2013  . Neurogenic bladder 01/25/2013  . GERD (gastroesophageal reflux disease)     Past Surgical History  Procedure Laterality Date  . Abdominal hysterectomy      Family History  Problem Relation Age of Onset  . Heart attack Mother   . Multiple sclerosis Sister   . Heart disease Sister   . Diabetes Other     Social history:  reports that she has never smoked. She has never used smokeless tobacco. She reports that she does not drink alcohol or use illicit drugs.    Allergies  Allergen Reactions  . Sulfa Antibiotics Rash    Medications:  Prior to Admission medications   Medication Sig Start Date End Date Taking? Authorizing Provider  acetaminophen (TYLENOL) 500 MG tablet Take 1,000 mg by mouth every 6 (six) hours as needed. For pain   Yes Historical Provider, MD  diclofenac (VOLTAREN) 75 MG EC tablet Take 1 tablet (75 mg total) by mouth 2 (two) times daily. 05/28/15  Yes Kathrynn Ducking, MD  DiphenhydrAMINE HCl (BENADRYL ALLERGY PO) Take by mouth as needed.   Yes  Historical Provider, MD  estropipate (OGEN) 0.75 MG tablet TAKE 1 TABLET DAILY FOR THREE WEEKS, 1 WEEK OFF ORALLY 06/29/15  Yes Historical Provider, MD  Interferon Beta-1b (BETASERON) 0.3 MG KIT injection Inject 0.25 mg into the skin every other day. 09/05/15  Yes Kathrynn Ducking, MD  levothyroxine (SYNTHROID, LEVOTHROID) 125 MCG tablet Take 125 mcg by mouth daily. 12/11/14  Yes Historical Provider, MD  Multiple Vitamin (MULTIVITAMIN WITH MINERALS) TABS Take 1 tablet by mouth daily.   Yes Historical Provider, MD  omeprazole (PRILOSEC) 20 MG capsule Take 20 mg by mouth daily.   Yes Historical Provider, MD  oxybutynin (DITROPAN) 5 MG tablet Take 5 mg by mouth at bedtime.    Yes Historical Provider, MD  rosuvastatin (CRESTOR) 20 MG tablet Take 10 mg by mouth daily.    Yes Historical Provider, MD    ROS:  Out of a complete 14 system review of symptoms, the patient complains only of the following symptoms, and all other reviewed systems are negative.  Ringing in the ears Environmental allergies Numbness  Blood pressure 192/86, pulse 82, height '5\' 4"'  (1.626 m), weight 203 lb (92.08 kg).  Physical Exam  General: The patient is alert and cooperative at the time of the examination.  Skin: No significant peripheral edema is noted.   Neurologic Exam  Mental status: The patient is alert and oriented x 3 at the time of  the examination. The patient has apparent normal recent and remote memory, with an apparently normal attention span and concentration ability.   Cranial nerves: Facial symmetry is present. Speech is normal, no aphasia or dysarthria is noted. Extraocular movements are full. Visual fields are full. Pupils are equal, round, and reactive to light. Discs are flat bilaterally.  Motor: The patient has good strength in all 4 extremities.  Sensory examination: Soft touch sensation is symmetric on the face, arms, and legs.  Coordination: The patient has good finger-nose-finger and  heel-to-shin bilaterally.  Gait and station: The patient has a slightly wide-based, unsteady gait. The patient walks with a cane. She is unable to perform tandem gait. Romberg is negative, but is unsteady. No drift is seen.  Reflexes: Deep tendon reflexes are symmetric, but are depressed.   Assessment/Plan:  1. Multiple sclerosis  2. Gait disturbance  3. Neurogenic bladder  The patient is stable clinically at this time. She will continue the Betaseron. She last had blood work done in October 2016. She will follow-up in 6 months, sooner if needed. She will call if new issues arise.  Jill Alexanders MD 09/30/2015 5:43 PM  [facility] [address] [place], [place] [address]  Phone [phone] Fax [phone]

## 2015-10-06 ENCOUNTER — Other Ambulatory Visit: Payer: Self-pay

## 2015-10-06 MED ORDER — DICLOFENAC SODIUM 75 MG PO TBEC: 75.0000 mg | DELAYED_RELEASE_TABLET | Freq: Two times a day (BID) | ORAL | Status: DC

## 2015-10-06 NOTE — Telephone Encounter (Signed)
Received faxed request for diclofenac refill. [facility]. F/u appt previously scheduled w/ Megan NP in Jan.

## 2015-12-11 DIAGNOSIS — H25013 Cortical age-related cataract, bilateral: Secondary | ICD-10-CM | POA: Diagnosis not present

## 2015-12-11 DIAGNOSIS — H401212 Low-tension glaucoma, right eye, moderate stage: Secondary | ICD-10-CM | POA: Diagnosis not present

## 2015-12-11 DIAGNOSIS — H401232 Low-tension glaucoma, bilateral, moderate stage: Secondary | ICD-10-CM | POA: Diagnosis not present

## 2015-12-11 DIAGNOSIS — H25011 Cortical age-related cataract, right eye: Secondary | ICD-10-CM | POA: Diagnosis not present

## 2015-12-11 DIAGNOSIS — H25012 Cortical age-related cataract, left eye: Secondary | ICD-10-CM | POA: Diagnosis not present

## 2015-12-11 DIAGNOSIS — H401222 Low-tension glaucoma, left eye, moderate stage: Secondary | ICD-10-CM | POA: Diagnosis not present

## 2016-02-02 ENCOUNTER — Emergency Department (HOSPITAL_COMMUNITY)
Admission: EM | Admit: 2016-02-02 | Discharge: 2016-02-02 | Disposition: A | Discharge status: Home / Self Care | Payer: Commercial Managed Care - HMO | Attending: Emergency Medicine | Admitting: Emergency Medicine

## 2016-02-02 ENCOUNTER — Encounter (HOSPITAL_COMMUNITY): Payer: Self-pay

## 2016-02-02 ENCOUNTER — Emergency Department (HOSPITAL_COMMUNITY): Payer: Commercial Managed Care - HMO

## 2016-02-02 DIAGNOSIS — F411 Generalized anxiety disorder: Secondary | ICD-10-CM | POA: Diagnosis not present

## 2016-02-02 DIAGNOSIS — E039 Hypothyroidism, unspecified: Secondary | ICD-10-CM | POA: Insufficient documentation

## 2016-02-02 DIAGNOSIS — I4891 Unspecified atrial fibrillation: Secondary | ICD-10-CM

## 2016-02-02 DIAGNOSIS — I480 Paroxysmal atrial fibrillation: Secondary | ICD-10-CM | POA: Diagnosis not present

## 2016-02-02 DIAGNOSIS — I10 Essential (primary) hypertension: Secondary | ICD-10-CM | POA: Diagnosis not present

## 2016-02-02 DIAGNOSIS — M8589 Other specified disorders of bone density and structure, multiple sites: Secondary | ICD-10-CM | POA: Diagnosis not present

## 2016-02-02 DIAGNOSIS — K2970 Gastritis, unspecified, without bleeding: Secondary | ICD-10-CM | POA: Diagnosis not present

## 2016-02-02 DIAGNOSIS — Z23 Encounter for immunization: Secondary | ICD-10-CM | POA: Diagnosis not present

## 2016-02-02 DIAGNOSIS — E7800 Pure hypercholesterolemia, unspecified: Secondary | ICD-10-CM | POA: Diagnosis not present

## 2016-02-02 DIAGNOSIS — N3281 Overactive bladder: Secondary | ICD-10-CM | POA: Diagnosis not present

## 2016-02-02 DIAGNOSIS — G35 Multiple sclerosis: Secondary | ICD-10-CM | POA: Diagnosis not present

## 2016-02-02 DIAGNOSIS — Z0000 Encounter for general adult medical examination without abnormal findings: Secondary | ICD-10-CM | POA: Diagnosis not present

## 2016-02-02 DIAGNOSIS — R000 Tachycardia, unspecified: Secondary | ICD-10-CM | POA: Diagnosis not present

## 2016-02-02 LAB — TSH: TSH: 1.904 u[IU]/mL (ref 0.350–4.500)

## 2016-02-02 LAB — CBC
HEMATOCRIT: 41.6 % (ref 36.0–46.0)
HEMOGLOBIN: 13.7 g/dL (ref 12.0–15.0)
MCH: 29.7 pg (ref 26.0–34.0)
MCHC: 32.9 g/dL (ref 30.0–36.0)
MCV: 90.2 fL (ref 78.0–100.0)
Platelets: 199 10*3/uL (ref 150–400)
RBC: 4.61 MIL/uL (ref 3.87–5.11)
RDW: 13.3 % (ref 11.5–15.5)
WBC: 7.5 10*3/uL (ref 4.0–10.5)

## 2016-02-02 LAB — BASIC METABOLIC PANEL
ANION GAP: 9 (ref 5–15)
BUN: 11 mg/dL (ref 6–20)
CHLORIDE: 106 mmol/L (ref 101–111)
CO2: 25 mmol/L (ref 22–32)
Calcium: 9.5 mg/dL (ref 8.9–10.3)
Creatinine, Ser: 0.97 mg/dL (ref 0.44–1.00)
GFR, EST NON AFRICAN AMERICAN: 58 mL/min — AB (ref >60)
Glucose, Bld: 119 mg/dL — ABNORMAL HIGH (ref 65–99)
POTASSIUM: 3.4 mmol/L — AB (ref 3.5–5.1)
SODIUM: 140 mmol/L (ref 135–145)

## 2016-02-02 LAB — TROPONIN I: Troponin I: <0.03 ng/mL (ref <0.03)

## 2016-02-02 MED ORDER — DILTIAZEM HCL 100 MG IV SOLR: 5.0000 mg/h | Freq: Once | INTRAVENOUS | Status: DC

## 2016-02-02 MED ORDER — APIXABAN 5 MG PO TABS
5.0000 mg | ORAL_TABLET | Freq: Two times a day (BID) | ORAL | Status: DC
  Administered 2016-02-02: 5 mg via ORAL
  Filled 2016-02-02 (×2): qty 1

## 2016-02-02 MED ORDER — DILTIAZEM HCL 25 MG/5ML IV SOLN: 5.0000 mg | Freq: Once | INTRAVENOUS | Status: DC

## 2016-02-02 MED ORDER — DILTIAZEM HCL 25 MG/5ML IV SOLN: 10.0000 mg | Freq: Once | INTRAVENOUS | Status: DC

## 2016-02-02 MED ORDER — DILTIAZEM HCL ER COATED BEADS 240 MG PO CP24: 240.0000 mg | ORAL_CAPSULE | Freq: Every day | ORAL | 0 refills | Status: DC

## 2016-02-02 MED ORDER — DILTIAZEM HCL ER COATED BEADS 120 MG PO CP24
240.0000 mg | ORAL_CAPSULE | Freq: Once | ORAL | Status: AC
  Administered 2016-02-02: 240 mg via ORAL
  Filled 2016-02-02: qty 2

## 2016-02-02 MED ORDER — DILTIAZEM HCL 25 MG/5ML IV SOLN
5.0000 mg | Freq: Once | INTRAVENOUS | Status: AC
  Administered 2016-02-02: 5 mg via INTRAVENOUS
  Filled 2016-02-02: qty 5

## 2016-02-02 MED ORDER — SODIUM CHLORIDE 0.9 % IV BOLUS (SEPSIS)
500.0000 mL | Freq: Once | INTRAVENOUS | Status: AC
  Administered 2016-02-02: 500 mL via INTRAVENOUS

## 2016-02-02 MED ORDER — APIXABAN 5 MG PO TABS: 5.0000 mg | ORAL_TABLET | Freq: Two times a day (BID) | ORAL | 0 refills | Status: DC

## 2016-02-02 NOTE — ED Notes (Signed)
Pt ambulated to RR with walker prior to gown and being placed on monitor. Able to ambulate w/o difficulty.

## 2016-02-02 NOTE — ED Notes (Signed)
Patient transported to X-ray 

## 2016-02-02 NOTE — Progress Notes (Signed)
ANTICOAGULATION CONSULT NOTE - Initial Consult  Pharmacy Consult for apixaban Indication: atrial fibrillation  Allergies  Allergen Reactions  . Sulfa Antibiotics Rash    Patient Measurements: Height: 5\' 4"  (162.6 cm) Weight: 195 lb (88.5 kg) IBW/kg (Calculated) : 54.7 Heparin Dosing Weight: N/A  Vital Signs: Temp: 97.7 F (36.5 C) (10/30 1122) Temp Source: Oral (10/30 1122) BP: 180/94 (10/30 1415) Pulse Rate: 91 (10/30 1415)  Labs:  Recent Labs  02/02/16 1120 02/02/16 1238  HGB 13.7  --   HCT 41.6  --   PLT 199  --   CREATININE 0.97  --   TROPONINI  --  <0.03    Estimated Creatinine Clearance: 58.9 mL/min (by C-G formula based on SCr of 0.97 mg/dL).  SCr < 1.5   Medical History: Past Medical History:  Diagnosis Date  . Abnormality of gait 01/25/2013  . Arthritis   . GERD (gastroesophageal reflux disease)   . Hypercholesteremia   . Hypertension   . Hypothyroidism   . Multiple sclerosis ([facility])   . Neurogenic bladder 01/25/2013    Medications:   (Not in a hospital admission)  Assessment: Patient has new onset afib. Does not take any PTA anticoagulants. Appropriate dose is 5 mg d/t age < 80, wt >60, SCr <1.5. Patient educated on new med.   Goal of Therapy:  Monitor platelets by anticoagulation protocol: Yes   Plan:  Eliquis 5 mg BID Monitor for s/sx of bleeding  Lianne Cure, PharmD Candidate 02/02/2016,2:28 PM

## 2016-02-02 NOTE — ED Notes (Signed)
Pt back in room. Left pt to change into gown.

## 2016-02-02 NOTE — ED Provider Notes (Signed)
Emergency Department Provider Note   I have reviewed the triage vital signs and the nursing notes.   HISTORY  Chief Complaint Atrial Fibrillation (pt sent over for evaluation of new onset of AFIB )   HPI Jillian Fisher is a 70 y.o. female with PMH of HTN, HLD, GERD, and hypothyroidism presents to the emergency department for evaluation of atrial fibrillation. She came from her primary care physician's office who performed a routine EKG and found a true fibrillation. She has no prior history of similar heart rhythm. The patient has been completely asymptomatic with this rhythm. He specifically denies any chest pain, palpitations, dyspnea, lightheadedness. No recent fever or chills. No change to medications. No history of a trip fibrillation. No sudden onset abdominal pain. No unilateral weakness or numbness.   Past Medical History:  Diagnosis Date  . Abnormality of gait 01/25/2013  . Arthritis   . GERD (gastroesophageal reflux disease)   . Hypercholesteremia   . Hypertension   . Hypothyroidism   . Multiple sclerosis (HCC)   . Neurogenic bladder 01/25/2013    Patient Active Problem List   Diagnosis Date Noted  . Multiple sclerosis (HCC) 01/25/2013  . Abnormality of gait 01/25/2013  . Neurogenic bladder 01/25/2013    Past Surgical History:  Procedure Laterality Date  . ABDOMINAL HYSTERECTOMY      Current Outpatient Rx  . Order #: [id number] Class: Historical Med  . Order #: [id number] Class: Historical Med  . Order #: [id number] Class: Historical Med  . Order #: [id number] Class: Normal  . Order #: [id number] Class: Historical Med  . Order #: [id number] Class: Historical Med  . Order #: [id number] Class: Historical Med  . Order #: [id number] Class: Historical Med  . Order #: [id number] Class: Historical Med  . Order #: [id number] Class: Print  . Order #: [id number] Class: Print    Allergies Sulfa antibiotics  Family History  Problem Relation Age of Onset  . Heart attack Mother   .  Multiple sclerosis Sister   . Heart disease Sister   . Diabetes Other     Social History Social History  Substance Use Topics  . Smoking status: Never Smoker  . Smokeless tobacco: Never Used     Comment: quit 30-40 years ago (documented in 2013)  . Alcohol use No    Review of Systems  Constitutional: No fever/chills Eyes: No visual changes. ENT: No sore throat. Cardiovascular: Denies chest pain. Respiratory: Denies shortness of breath. Gastrointestinal: No abdominal pain.  No nausea, no vomiting.  No diarrhea.  No constipation. Genitourinary: Negative for dysuria. Musculoskeletal: Negative for back pain. Skin: Negative for rash. Neurological: Negative for headaches, focal weakness or numbness.  10-point ROS otherwise negative.  ____________________________________________   PHYSICAL EXAM:  VITAL SIGNS: ED Triage Vitals  Enc Vitals Group     BP 02/02/16 1122 150/98     Pulse Rate 02/02/16 1122 111     Resp 02/02/16 1122 18     Temp 02/02/16 1122 97.7 F (36.5 C)     Temp Source 02/02/16 1122 Oral     SpO2 02/02/16 1122 99 %     Weight 02/02/16 1120 195 lb (88.5 kg)     Height 02/02/16 1120 5\' 4"  (1.626 m)   Constitutional: Alert and oriented. Well appearing and in no acute distress. Eyes: Conjunctivae are normal.  Head: Atraumatic. Nose: No congestion/rhinnorhea. Mouth/Throat: Mucous membranes are moist.  Oropharynx non-erythematous. Neck: No stridor.  Cardiovascular: a fib with RVR. Good peripheral circulation. Grossly normal heart sounds.   Respiratory: Normal respiratory  effort.  No retractions. Lungs CTAB. Gastrointestinal: Soft and nontender. No distention.  Musculoskeletal: No lower extremity tenderness nor edema. No gross deformities of extremities. Neurologic:  Normal speech and language. No gross focal neurologic deficits are appreciated.  Skin:  Skin is warm, dry and intact. No rash noted.  ____________________________________________    LABS (all labs ordered are listed, but only abnormal results are displayed)  Labs Reviewed  BASIC METABOLIC PANEL - Abnormal; Notable for the following:       Result Value   Potassium 3.4 (*)    Glucose, Bld 119 (*)    GFR calc non Af Amer 58 (*)    All other components within normal limits  CBC  TROPONIN I  TSH   ____________________________________________  EKG   EKG Interpretation  Date/Time:  Monday February 02 2016 11:22:28 EDT Ventricular Rate:  125 PR Interval:    QRS Duration: 80 QT Interval:  290 QTC Calculation: 418 R Axis:   0 Text Interpretation:  Atrial fibrillation with rapid ventricular response Marked ST abnormality, possible inferior subendocardial injury Abnormal ECG ST depressions likely rate-related. No STEMI.  Confirmed by Ikram Riebe MD, [facility] [phone]) on 02/02/2016 12:34:46 PM       ____________________________________________  RADIOLOGY  Dg Chest 2 View  Result Date: 02/02/2016 CLINICAL DATA:  Atrial fibrillation EXAM: CHEST  2 VIEW COMPARISON:  None. FINDINGS: Borderline cardiomegaly. No acute infiltrate or pleural effusion. No pulmonary edema. Mild degenerative changes thoracic spine. IMPRESSION: Borderline cardiomegaly.  No active disease. Electronically Signed   By: Natasha Mead M.D.   On: 02/02/2016 12:23    ____________________________________________   PROCEDURES  Procedure(s) performed:   Procedures  None ____________________________________________   INITIAL IMPRESSION / ASSESSMENT AND PLAN / ED COURSE  Pertinent labs & imaging results that were available during my care of the patient were reviewed by me and considered in my medical decision making (see chart for details).  Agent resents emergency department for evaluation of a symptomatically atrophic relation found on routine EKG during annual physical. Patient has A. fib with RVR on initial presentation with ST segment depression likely secondary to increased rate. Plan for  diltiazem bolus. Patient has elevated blood pressure on arrival. Will follow closely and discuss with cardiology. She is not anticoagulated and does not have a known onset time for her A. Fib.   01:55 PM Labs resulted with no acute abnormality. Patient responded transiently to the diltiazem bolus. We'll bolus again and follow with continuous infusion with ST changes with increased rate. Patient remains asymptomatic.   02:01 PM Spoke with Cardiology Dr. Jacinto Halim who will be down to see the patient. He recommends holding off on the Dilt bolus and drip. Advises starting Diltiazem 240 mg XR and Eliquis. Have consulted pharmacy for this. He will be down for evaluation. Appreciate assistance.   02:49 PM Patient has been seen by Dr. Jacinto Halim. Appreciate assistance with case. He has set up appointment with him this Friday for continued outpatient mgmt and w/u. He has reviewed the EKG and believes that it is non-ischemic. Pharmacy has consulted regarding the Eliquis dosing and provided card for free 30 day Rx. I discussed return precautions in detail regarding A-fib and bleeding risk. Plan for discharge at this time.  ____________________________________________  FINAL CLINICAL IMPRESSION(S) / ED DIAGNOSES  Final diagnoses:  Atrial fibrillation, unspecified type (HCC)     MEDICATIONS GIVEN DURING THIS VISIT:  Medications  apixaban (ELIQUIS) tablet 5 mg (5 mg Oral Given 02/02/16 1443)  diltiazem (CARDIZEM)  injection 5 mg (5 mg Intravenous Given 02/02/16 1313)  sodium chloride 0.9 % bolus 500 mL (0 mLs Intravenous Stopped 02/02/16 1556)  diltiazem (CARDIZEM CD) 24 hr capsule 240 mg (240 mg Oral Given 02/02/16 1521)     NEW OUTPATIENT MEDICATIONS STARTED DURING THIS VISIT:  New Prescriptions   APIXABAN (ELIQUIS) 5 MG TABS TABLET    Take 1 tablet (5 mg total) by mouth 2 (two) times daily.   DILTIAZEM (CARDIZEM CD) 240 MG 24 HR CAPSULE    Take 1 capsule (240 mg total) by mouth daily.      Note:   This document was prepared using Dragon voice recognition software and may include unintentional dictation errors.  Alona Bene, MD Emergency Medicine   Maia Plan, MD 02/02/16 [phone]

## 2016-02-02 NOTE — ED Notes (Signed)
Pt returned to room  

## 2016-02-02 NOTE — H&P (Signed)
Jillian Fisher is an 70 y.o. female.   Chief Complaint: A. Fibrillation HPI: Jillian Fisher  is a 70 y.o. female  With history of hyperlipidemia, multiple sclerosis, denies that she has hypertension or any other vascular disease, lives independently at home went for complete physical today and was found to be in atrial fibrillation with rapid ventricular response. Central to the emergency room for further evaluation and management.  Patient has noticed mild fatigue and dyspnea over last few weeks but overall states that she is in her normal state of health. No PND or orthopnea. No leg edema. Denies any chest pain or palpitations.  Past Medical History:  Diagnosis Date  . Abnormality of gait 01/25/2013  . Arthritis   . GERD (gastroesophageal reflux disease)   . Hypercholesteremia   . Hypertension   . Hypothyroidism   . Multiple sclerosis ([place])   . Neurogenic bladder 01/25/2013    Past Surgical History:  Procedure Laterality Date  . ABDOMINAL HYSTERECTOMY      Family History  Problem Relation Age of Onset  . Heart attack Mother   . Multiple sclerosis Sister   . Heart disease Sister   . Diabetes Other    Social History:  reports that she has never smoked. She has never used smokeless tobacco. She reports that she does not drink alcohol or use drugs.  Allergies:  Allergies  Allergen Reactions  . Sulfa Antibiotics Rash    Review of Systems - Negative except Fatigue over the past few weeks. Has bilateral leg weakness due to multiple sclerosis.  Blood pressure 180/94, pulse 91, temperature 97.7 F (36.5 C), temperature source Oral, resp. rate (!) 31, height '5\' 4"'  (1.626 m), weight 88.5 kg (195 lb), SpO2 98 %. General appearance: alert, cooperative, appears stated age and no distress Eyes: negative findings: lids and lashes normal Neck: no adenopathy, no carotid bruit, no JVD, supple, symmetrical, trachea midline and thyroid not enlarged, symmetric, no  tenderness/mass/nodules Neck: JVP - normal, carotids 2+= without bruits Resp: clear to auscultation bilaterally Chest wall: no tenderness Cardio: S1 is variable, S2 is normal. There is no gallop or murmur appreciated. GI: soft, non-tender; bowel sounds normal; no masses,  no organomegaly Extremities: extremities normal, atraumatic, no cyanosis or edema Pulses: 2+ and symmetric Skin: Skin color, texture, turgor normal. No rashes or lesions Neurologic: Grossly normal  Results for orders placed or performed during the hospital encounter of 02/02/16 (from the past 48 hour(s))  Basic metabolic panel     Status: Abnormal   Collection Time: 02/02/16 11:20 AM  Result Value Ref Range   Sodium 140 135 - 145 mmol/L   Potassium 3.4 (L) 3.5 - 5.1 mmol/L   Chloride 106 101 - 111 mmol/L   CO2 25 22 - 32 mmol/L   Glucose, Bld 119 (H) 65 - 99 mg/dL   BUN 11 6 - 20 mg/dL   Creatinine, Ser 0.97 0.44 - 1.00 mg/dL   Calcium 9.5 8.9 - 10.3 mg/dL   GFR calc non Af Amer 58 (L) >60 mL/min   GFR calc Af Amer >60 >60 mL/min    Comment: (NOTE) The eGFR has been calculated using the CKD EPI equation. This calculation has not been validated in all clinical situations. eGFR's persistently <60 mL/min signify possible Chronic Kidney Disease.    Anion gap 9 5 - 15  CBC     Status: None   Collection Time: 02/02/16 11:20 AM  Result Value Ref Range   WBC 7.5 4.0 -  10.5 K/uL   RBC 4.61 3.87 - 5.11 MIL/uL   Hemoglobin 13.7 12.0 - 15.0 g/dL   HCT 41.6 36.0 - 46.0 %   MCV 90.2 78.0 - 100.0 fL   MCH 29.7 26.0 - 34.0 pg   MCHC 32.9 30.0 - 36.0 g/dL   RDW 13.3 11.5 - 15.5 %   Platelets 199 150 - 400 K/uL  Troponin I     Status: None   Collection Time: 02/02/16 12:38 PM  Result Value Ref Range   Troponin I <0.03 <0.03 ng/mL  TSH     Status: None   Collection Time: 02/02/16 12:38 PM  Result Value Ref Range   TSH 1.904 0.350 - 4.500 uIU/mL    Comment: Performed by a 3rd Generation assay with a functional  sensitivity of <=0.01 uIU/mL.   Dg Chest 2 View  Result Date: 02/02/2016 CLINICAL DATA:  Atrial fibrillation EXAM: CHEST  2 VIEW COMPARISON:  None. FINDINGS: Borderline cardiomegaly. No acute infiltrate or pleural effusion. No pulmonary edema. Mild degenerative changes thoracic spine. IMPRESSION: Borderline cardiomegaly.  No active disease. Electronically Signed   By: Lahoma Crocker M.D.   On: 02/02/2016 12:23    Labs:   Lab Results  Component Value Date   WBC 7.5 02/02/2016   HGB 13.7 02/02/2016   HCT 41.6 02/02/2016   MCV 90.2 02/02/2016   PLT 199 02/02/2016    Recent Labs Lab 02/02/16 1120  NA 140  K 3.4*  CL 106  CO2 25  BUN 11  CREATININE 0.97  CALCIUM 9.5  GLUCOSE 119*    Lab Results  Component Value Date   TROPONINI <0.03 02/02/2016     Recent Labs  02/02/16 1238  TSH 1.904    EKG: Atrial fibrillation with rapid ventricle response. , No evidence of ischemia. Borderline criteria for LVH, may be normal.. Compared to 04/08/2015, A. fib is new.   Current Facility-Administered Medications:  .  apixaban (ELIQUIS) tablet 5 mg, 5 mg, Oral, BID, Jake Church Masters, RPH .  diltiazem (CARDIZEM CD) 24 hr capsule 240 mg, 240 mg, Oral, Once, Margette Fast, MD .  methylPREDNISolone sodium succinate (SOLU-MEDROL) 500 mg in sodium chloride 0.9 % 50 mL IVPB, 500 mg, Intravenous, Once, Ward Givens, NP .  sodium chloride 0.9 % bolus 500 mL, 500 mL, Intravenous, Once, Margette Fast, MD  Current Outpatient Prescriptions:  .  acetaminophen (TYLENOL) 500 MG tablet, Take 1,000 mg by mouth every 6 (six) hours as needed. For pain, Disp: , Rfl:  .  ALPRAZolam (XANAX) 0.25 MG tablet, Take 0.25 mg by mouth daily as needed for anxiety., Disp: , Rfl:  .  diclofenac (VOLTAREN) 75 MG EC tablet, Take 1 tablet (75 mg total) by mouth 2 (two) times daily., Disp: 180 tablet, Rfl: 3 .  estropipate (OGEN) 0.75 MG tablet, TAKE 1 TABLET DAILY FOR THREE WEEKS, 1 WEEK OFF ORALLY, Disp: , Rfl: 12 .   Interferon Beta-1b (BETASERON) 0.3 MG KIT injection, Inject 0.25 mg into the skin every other day., Disp: 1 kit, Rfl: 11 .  levothyroxine (SYNTHROID, LEVOTHROID) 125 MCG tablet, Take 125 mcg by mouth daily., Disp: , Rfl: 3 .  Multiple Vitamin (MULTIVITAMIN WITH MINERALS) TABS, Take 1 tablet by mouth daily., Disp: , Rfl:  .  omeprazole (PRILOSEC) 20 MG capsule, Take 20 mg by mouth daily., Disp: , Rfl:  .  oxybutynin (DITROPAN) 5 MG tablet, Take 5 mg by mouth at bedtime. , Disp: , Rfl:  .  rosuvastatin (  CRESTOR) 20 MG tablet, Take 10 mg by mouth daily. , Disp: , Rfl:   Current Facility-Administered Medications for the 02/02/16 encounter [facility] Encounter)  Medication  . methylPREDNISolone sodium succinate (SOLU-MEDROL) 500 mg in sodium chloride 0.9 % 50 mL IVPB   Current Meds  Medication Sig  . acetaminophen (TYLENOL) 500 MG tablet Take 1,000 mg by mouth every 6 (six) hours as needed. For pain  . ALPRAZolam (XANAX) 0.25 MG tablet Take 0.25 mg by mouth daily as needed for anxiety.  . diclofenac (VOLTAREN) 75 MG EC tablet Take 1 tablet (75 mg total) by mouth 2 (two) times daily.  Marland Kitchen estropipate (OGEN) 0.75 MG tablet TAKE 1 TABLET DAILY FOR THREE WEEKS, 1 WEEK OFF ORALLY  . Interferon Beta-1b (BETASERON) 0.3 MG KIT injection Inject 0.25 mg into the skin every other day.  . levothyroxine (SYNTHROID, LEVOTHROID) 125 MCG tablet Take 125 mcg by mouth daily.  . Multiple Vitamin (MULTIVITAMIN WITH MINERALS) TABS Take 1 tablet by mouth daily.  Marland Kitchen omeprazole (PRILOSEC) 20 MG capsule Take 20 mg by mouth daily.  Marland Kitchen oxybutynin (DITROPAN) 5 MG tablet Take 5 mg by mouth at bedtime.   . rosuvastatin (CRESTOR) 20 MG tablet Take 10 mg by mouth daily.    Assessment/Plan 1. New onset atrial fibrillation with rapid ventricle response, except for fatigue asymptomatic. CHA2DS2-VASCScore: Risk Score  2,  Yearly risk of stroke  2.2. Recommendation: ASA No/Anticoagulation Yes The risk score is 3 and yearly risk of  stroke is 3.2% if hypertension is present.  2. Hyperlipidemia 3. History of multiple sclerosis 4. Elevated blood pressure without diagnosis of hypertension, patient denies she has hypertension. Not on therapy at home.  Recommendation: Patient can be discharged home on diltiazem CD 240 mg daily, along with Eliquis 5 g by mouth twice a day. She needs rate control, and eventually consider cardioversion after 2-3 weeks of anticoagulation. Outpatient stress test and echocardiogram will be scheduled. Patient's daughter is present the bedside, I had a long discussion with her regarding bleeding risk and stroke risk. There is no history of GI bleed, patient states that she has had routine GI evaluation about a year to 2 years ago and was told to be normal. Discussed regarding fall risk. Replace potassium.  Patient unable to do exercise stress test, hence was scheduled for [facility]. I'll continue to follow her in the outpatient basis. We have discussed regarding NSAID use and the risk of bleed with Eliquis.  Adrian Prows, MD 02/02/2016, 2:31 PM [facility] [place] Pager: [phone] Office: [phone] If no answer: Cell:  [phone]

## 2016-02-02 NOTE — ED Triage Notes (Signed)
Pt went for her physical and was found to be in AFIB pt has no history of AFIB

## 2016-02-06 DIAGNOSIS — I4891 Unspecified atrial fibrillation: Secondary | ICD-10-CM | POA: Diagnosis not present

## 2016-02-09 DIAGNOSIS — E7800 Pure hypercholesterolemia, unspecified: Secondary | ICD-10-CM | POA: Diagnosis not present

## 2016-02-09 DIAGNOSIS — I4891 Unspecified atrial fibrillation: Secondary | ICD-10-CM | POA: Diagnosis not present

## 2016-02-09 DIAGNOSIS — I10 Essential (primary) hypertension: Secondary | ICD-10-CM | POA: Diagnosis not present

## 2016-02-19 DIAGNOSIS — I4891 Unspecified atrial fibrillation: Secondary | ICD-10-CM | POA: Diagnosis not present

## 2016-02-24 ENCOUNTER — Encounter (HOSPITAL_COMMUNITY): Payer: Self-pay | Admitting: Emergency Medicine

## 2016-02-24 ENCOUNTER — Ambulatory Visit (INDEPENDENT_AMBULATORY_CARE_PROVIDER_SITE_OTHER): Payer: Commercial Managed Care - HMO | Admitting: Nurse Practitioner

## 2016-02-24 ENCOUNTER — Telehealth: Payer: Self-pay | Admitting: Neurology

## 2016-02-24 ENCOUNTER — Inpatient Hospital Stay (HOSPITAL_COMMUNITY)
Admission: EM | Admit: 2016-02-24 | Discharge: 2016-02-27 | DRG: 690 | Disposition: A | Discharge status: Home / Self Care | Payer: Commercial Managed Care - HMO | Attending: Internal Medicine | Admitting: Internal Medicine

## 2016-02-24 ENCOUNTER — Emergency Department (HOSPITAL_COMMUNITY): Payer: Commercial Managed Care - HMO

## 2016-02-24 ENCOUNTER — Telehealth: Payer: Self-pay

## 2016-02-24 ENCOUNTER — Encounter: Payer: Self-pay | Admitting: Nurse Practitioner

## 2016-02-24 VITALS — BP 120/60 | HR 80 | Ht 64.0 in | Wt 193.0 lb

## 2016-02-24 DIAGNOSIS — N390 Urinary tract infection, site not specified: Secondary | ICD-10-CM | POA: Diagnosis not present

## 2016-02-24 DIAGNOSIS — E785 Hyperlipidemia, unspecified: Secondary | ICD-10-CM | POA: Diagnosis not present

## 2016-02-24 DIAGNOSIS — Z8249 Family history of ischemic heart disease and other diseases of the circulatory system: Secondary | ICD-10-CM

## 2016-02-24 DIAGNOSIS — I480 Paroxysmal atrial fibrillation: Secondary | ICD-10-CM | POA: Diagnosis not present

## 2016-02-24 DIAGNOSIS — M1990 Unspecified osteoarthritis, unspecified site: Secondary | ICD-10-CM | POA: Diagnosis not present

## 2016-02-24 DIAGNOSIS — R531 Weakness: Secondary | ICD-10-CM | POA: Diagnosis not present

## 2016-02-24 DIAGNOSIS — R269 Unspecified abnormalities of gait and mobility: Secondary | ICD-10-CM

## 2016-02-24 DIAGNOSIS — E7800 Pure hypercholesterolemia, unspecified: Secondary | ICD-10-CM | POA: Diagnosis not present

## 2016-02-24 DIAGNOSIS — E039 Hypothyroidism, unspecified: Secondary | ICD-10-CM | POA: Diagnosis not present

## 2016-02-24 DIAGNOSIS — G35D Multiple sclerosis, unspecified: Secondary | ICD-10-CM | POA: Diagnosis present

## 2016-02-24 DIAGNOSIS — Z79899 Other long term (current) drug therapy: Secondary | ICD-10-CM

## 2016-02-24 DIAGNOSIS — K219 Gastro-esophageal reflux disease without esophagitis: Secondary | ICD-10-CM | POA: Diagnosis present

## 2016-02-24 DIAGNOSIS — R29898 Other symptoms and signs involving the musculoskeletal system: Secondary | ICD-10-CM | POA: Diagnosis not present

## 2016-02-24 DIAGNOSIS — G35 Multiple sclerosis: Secondary | ICD-10-CM

## 2016-02-24 DIAGNOSIS — Z7901 Long term (current) use of anticoagulants: Secondary | ICD-10-CM | POA: Diagnosis not present

## 2016-02-24 DIAGNOSIS — I10 Essential (primary) hypertension: Secondary | ICD-10-CM | POA: Diagnosis present

## 2016-02-24 DIAGNOSIS — R2681 Unsteadiness on feet: Secondary | ICD-10-CM | POA: Diagnosis present

## 2016-02-24 DIAGNOSIS — Z833 Family history of diabetes mellitus: Secondary | ICD-10-CM | POA: Diagnosis not present

## 2016-02-24 DIAGNOSIS — Z820 Family history of epilepsy and other diseases of the nervous system: Secondary | ICD-10-CM | POA: Diagnosis not present

## 2016-02-24 LAB — URINALYSIS, ROUTINE W REFLEX MICROSCOPIC
Glucose, UA: NEGATIVE mg/dL
Hgb urine dipstick: NEGATIVE
KETONES UR: 15 mg/dL — AB
NITRITE: NEGATIVE
PH: 6 (ref 5.0–8.0)
Protein, ur: NEGATIVE mg/dL
SPECIFIC GRAVITY, URINE: 1.026 (ref 1.005–1.030)

## 2016-02-24 LAB — BASIC METABOLIC PANEL
ANION GAP: 8 (ref 5–15)
BUN: 13 mg/dL (ref 6–20)
CO2: 25 mmol/L (ref 22–32)
Calcium: 9.4 mg/dL (ref 8.9–10.3)
Chloride: 105 mmol/L (ref 101–111)
Creatinine, Ser: 1.05 mg/dL — ABNORMAL HIGH (ref 0.44–1.00)
GFR, EST NON AFRICAN AMERICAN: 53 mL/min — AB (ref >60)
Glucose, Bld: 116 mg/dL — ABNORMAL HIGH (ref 65–99)
POTASSIUM: 4.6 mmol/L (ref 3.5–5.1)
SODIUM: 138 mmol/L (ref 135–145)

## 2016-02-24 LAB — URINE MICROSCOPIC-ADD ON

## 2016-02-24 LAB — CBC
HEMATOCRIT: 40.5 % (ref 36.0–46.0)
HEMOGLOBIN: 13.2 g/dL (ref 12.0–15.0)
MCH: 29.5 pg (ref 26.0–34.0)
MCHC: 32.6 g/dL (ref 30.0–36.0)
MCV: 90.6 fL (ref 78.0–100.0)
Platelets: 197 10*3/uL (ref 150–400)
RBC: 4.47 MIL/uL (ref 3.87–5.11)
RDW: 13.7 % (ref 11.5–15.5)
WBC: 11.5 10*3/uL — AB (ref 4.0–10.5)

## 2016-02-24 LAB — CBG MONITORING, ED: Glucose-Capillary: 120 mg/dL — ABNORMAL HIGH (ref 65–99)

## 2016-02-24 MED ORDER — CEPHALEXIN 500 MG PO CAPS: 1000.0000 mg | ORAL_CAPSULE | Freq: Two times a day (BID) | ORAL | 0 refills | Status: DC

## 2016-02-24 MED ORDER — DEXTROSE 5 % IV SOLN
1.0000 g | Freq: Once | INTRAVENOUS | Status: AC
  Administered 2016-02-24: 1 g via INTRAVENOUS
  Filled 2016-02-24: qty 10

## 2016-02-24 MED ORDER — CEPHALEXIN 250 MG PO CAPS: 1000.0000 mg | ORAL_CAPSULE | Freq: Once | ORAL | Status: DC

## 2016-02-24 MED ORDER — GADOBENATE DIMEGLUMINE 529 MG/ML IV SOLN
20.0000 mL | Freq: Once | INTRAVENOUS | Status: AC
  Administered 2016-02-24: 19 mL via INTRAVENOUS

## 2016-02-24 NOTE — ED Notes (Signed)
Pt urinated in bed pan. Peri care performed and bedding changed.

## 2016-02-24 NOTE — Telephone Encounter (Signed)
RN was told to call [facility] ED about pt needing to be seen in the ED for possible stroke. Rn spoke with [facility] triage nurse in the ED. Pt was in the office today being seen by Carolyn(NP) and is having weakness all over.  Pt was recently seen in the ED lat month for atrial fibrillation.This is a big change per Carolyn(NP). Pt having right lower extremity weakness. Rn tried to give the patients name, but was unable to. RN at [facility] was told pt needs to come to the ED. Pt was also evaluated by Dr. Doroteo Bradford it was recommended by him for pt to seek the ED. Number call was [phone].Pt will be seeking the ED at [address] [facility]. Rn explain pt needs stat MRI.

## 2016-02-24 NOTE — Telephone Encounter (Signed)
Daughter returned Jennifer's call. Relayed message below.

## 2016-02-24 NOTE — ED Provider Notes (Signed)
[facility] DEPT Provider Note   CSN: [id number] Arrival date & time: 02/24/16  1429     History   Chief Complaint Chief Complaint  Patient presents with  . Multiple Sclerosis  . Weakness    HPI Jillian Fisher is a 70 y.o. female.  HPI Patient is sent from her neurologist's office for concern of right lower extremity weakness. There is concern for CVA versus MS exacerbation. The patient reports that when she tried to get out of bed this morning she could not use her right leg. It was dragging and she required assistance. She had gone to bed at approximately 9 or 10 PM and awakened at approximately 5am. No headache, no visual change, no chest pain, no shortness of breath. Past Medical History:  Diagnosis Date  . Abnormality of gait 01/25/2013  . Arthritis   . GERD (gastroesophageal reflux disease)   . Hypercholesteremia   . Hypertension   . Hypothyroidism   . Multiple sclerosis ([place])   . Neurogenic bladder 01/25/2013    Patient Active Problem List   Diagnosis Date Noted  . Multiple sclerosis ([place]) 01/25/2013  . Abnormality of gait 01/25/2013  . Neurogenic bladder 01/25/2013    Past Surgical History:  Procedure Laterality Date  . ABDOMINAL HYSTERECTOMY      OB History    No data available       Home Medications    Prior to Admission medications   Medication Sig Start Date End Date Taking? Authorizing Provider  acetaminophen (TYLENOL) 500 MG tablet Take 1,000 mg by mouth every 6 (six) hours as needed. For pain   Yes Historical Provider, MD  ALPRAZolam (XANAX) 0.25 MG tablet Take 0.25 mg by mouth daily as needed for anxiety. 12/25/15  Yes Historical Provider, MD  apixaban (ELIQUIS) 5 MG TABS tablet Take 1 tablet (5 mg total) by mouth 2 (two) times daily. 02/02/16 03/03/16 Yes Margette Fast, MD  diltiazem (CARDIZEM CD) 240 MG 24 hr capsule Take 1 capsule (240 mg total) by mouth daily. 02/02/16  Yes Margette Fast, MD  estropipate (OGEN) 0.75 MG tablet TAKE 1  TABLET DAILY FOR THREE WEEKS, 1 WEEK OFF ORALLY 06/29/15  Yes Historical Provider, MD  Interferon Beta-1b (BETASERON) 0.3 MG KIT injection Inject 0.25 mg into the skin every other day. 09/05/15  Yes Kathrynn Ducking, MD  levothyroxine (SYNTHROID, LEVOTHROID) 125 MCG tablet Take 125 mcg by mouth daily. 12/11/14  Yes Historical Provider, MD  Multiple Vitamin (MULTIVITAMIN WITH MINERALS) TABS Take 1 tablet by mouth daily.   Yes Historical Provider, MD  omeprazole (PRILOSEC) 20 MG capsule Take 20 mg by mouth daily.   Yes Historical Provider, MD  oxybutynin (DITROPAN) 5 MG tablet Take 5 mg by mouth at bedtime.    Yes Historical Provider, MD  rosuvastatin (CRESTOR) 20 MG tablet Take 10 mg by mouth daily.    Yes Historical Provider, MD  cephALEXin (KEFLEX) 500 MG capsule Take 2 capsules (1,000 mg total) by mouth 2 (two) times daily. 02/24/16   Charlesetta Shanks, MD    Family History Family History  Problem Relation Age of Onset  . Heart attack Mother   . Multiple sclerosis Sister   . Heart disease Sister   . Diabetes Other     Social History Social History  Substance Use Topics  . Smoking status: Never Smoker  . Smokeless tobacco: Never Used     Comment: quit 30-40 years ago (documented in 2013)  . Alcohol use No  Allergies   Sulfa antibiotics   Review of Systems Review of Systems 10 Systems reviewed and are negative for acute change except as noted in the HPI.   Physical Exam Updated Vital Signs BP 166/90   Pulse 91   Temp 99.3 F (37.4 C)   Resp 17   SpO2 97%   Physical Exam  Constitutional: She is oriented to person, place, and time. She appears well-developed and well-nourished. No distress.  HENT:  Head: Normocephalic and atraumatic.  Right Ear: External ear normal.  Left Ear: External ear normal.  Mouth/Throat: Oropharynx is clear and moist.  Eyes: EOM are normal. Pupils are equal, round, and reactive to light.  Neck: Neck supple.  Cardiovascular: Normal rate,  regular rhythm, normal heart sounds and intact distal pulses.   Pulmonary/Chest: Effort normal and breath sounds normal.  Abdominal: Soft. She exhibits no distension. There is no tenderness. There is no guarding.  Musculoskeletal: She exhibits no edema or deformity.  Neurological: She is alert and oriented to person, place, and time. No cranial nerve deficit. She exhibits abnormal muscle tone.  Bilateral upper extremity grip strength and full are 5+/5. Lower extremity right, difficulty elevating off of the bed for more than 5 seconds, left lower extremity elevates without difficulty.  Skin: Skin is warm and dry. She is not diaphoretic.  Psychiatric: She has a normal mood and affect.     ED Treatments / Results  Labs (all labs ordered are listed, but only abnormal results are displayed) Labs Reviewed  BASIC METABOLIC PANEL - Abnormal; Notable for the following:       Result Value   Glucose, Bld 116 (*)    Creatinine, Ser 1.05 (*)    GFR calc non Af Amer 53 (*)    All other components within normal limits  CBC - Abnormal; Notable for the following:    WBC 11.5 (*)    All other components within normal limits  URINALYSIS, ROUTINE W REFLEX MICROSCOPIC (NOT AT [facility]) - Abnormal; Notable for the following:    Color, Urine AMBER (*)    APPearance CLOUDY (*)    Bilirubin Urine SMALL (*)    Ketones, ur 15 (*)    Leukocytes, UA MODERATE (*)    All other components within normal limits  URINE MICROSCOPIC-ADD ON - Abnormal; Notable for the following:    Squamous Epithelial / LPF 6-30 (*)    Bacteria, UA MANY (*)    All other components within normal limits  CBG MONITORING, ED - Abnormal; Notable for the following:    Glucose-Capillary 120 (*)    All other components within normal limits  URINE CULTURE    EKG  EKG Interpretation None       Radiology Mr Jeri Cos And Wo Contrast  Result Date: 02/24/2016 CLINICAL DATA:  New onset RIGHT lower extremity weakness today. Recent  diagnosis of atrial fibrillation, on Eliquis. History of multiple sclerosis, last exacerbation was 3 years ago. History of hypertension and hypercholesterolemia. EXAM: MRI HEAD WITHOUT AND WITH CONTRAST TECHNIQUE: Multiplanar, multiecho pulse sequences of the brain and surrounding structures were obtained without and with intravenous contrast. CONTRAST:  74m MULTIHANCE GADOBENATE DIMEGLUMINE 529 MG/ML IV SOLN COMPARISON:  MRI of the brain May 28, 2014 FINDINGS: INTRACRANIAL CONTENTS: No reduced diffusion to suggest acute ischemia or hyperacute demyelination. Faint T2 shine through LEFT posterior frontal lobe. No susceptibility artifact to suggest acute hemorrhage. Chronic LEFT LEFT basal ganglia microhemorrhage. The ventricles and sulci are normal for patient's age. Confluent  supratentorial white matter FLAIR T2 hyperintensities, many of which radiate from the periventricular margin with low T1 signal compatible with black holes of demyelination, unchanged. Subtle T2 bright signal abnormality in the basal ganglia and LEFT thalamus is unchanged. Scattered frontoparietal cortical lesions. No abnormal intraparenchymal or extra-axial enhancement. No abnormal extra-axial fluid collections. No extra-axial masses. VASCULAR: Normal major intracranial vascular flow voids present at skull base. SKULL AND UPPER CERVICAL SPINE: No abnormal sellar expansion. No suspicious calvarial bone marrow signal. Craniocervical junction maintained. SINUSES/ORBITS: The mastoid air-cells and included paranasal sinuses are well-aerated.The included ocular globes and orbital contents are non-suspicious. OTHER: None. IMPRESSION: No acute intracranial process. Severe chronic demyelination, stable from prior examination without enhancement to suggest acute demyelination. No parenchymal brain volume loss for age. Electronically Signed   By: Elon Alas M.D.   On: 02/24/2016 21:17    Procedures Procedures (including critical care  time)  Medications Ordered in ED Medications  cefTRIAXone (ROCEPHIN) 1 g in dextrose 5 % 50 mL IVPB (1 g Intravenous New Bag/Given 02/24/16 2255)  gadobenate dimeglumine (MULTIHANCE) injection 20 mL (19 mLs Intravenous Contrast Given 02/24/16 2055)     Initial Impression / Assessment and Plan / ED Course  I have reviewed the triage vital signs and the nursing notes.  Pertinent labs & imaging results that were available during my care of the patient were reviewed by me and considered in my medical decision making (see chart for details).  Clinical Course    Consult: Dr. Roland Rack. He finds the patient to have unsteady gait and be unreliable for fall at home. He does advise patient be admitted to the hospital and started on antibiotics for UTI identified. He advises against any steroids empirically it's point.  Final Clinical Impressions(s) / ED Diagnoses   Final diagnoses:  Multiple sclerosis (HCC)  Right leg weakness  Lower urinary tract infectious disease    New Prescriptions New Prescriptions   CEPHALEXIN (KEFLEX) 500 MG CAPSULE    Take 2 capsules (1,000 mg total) by mouth 2 (two) times daily.     Charlesetta Shanks, MD 02/24/16 [phone]

## 2016-02-24 NOTE — Patient Instructions (Addendum)
Need to go to the ER for MRI of the brain with and without to r/o acute stroke Follow up after MRI

## 2016-02-24 NOTE — Progress Notes (Addendum)
[facility]  PATIENT: Jillian Fisher DOB: [birth year]   REASON FOR VISIT: Follow-up for multiple sclerosis, increased weakness, abnormality of gait HISTORY FROM: Patient    HISTORY OF PRESENT ILLNESS:UPDATE 11/21/2017CM Ms. Jillian Fisher, 70 year old female returns for follow-up. She has a history of multiple sclerosis and is currently on Betaseron. She called today to be seen because of acute onset right lower extremity weakness. She went to bed last night and was fine, ambulated normally yesterday. Woke up this morning and unable to take any steps due to increased weakness. She was seen in the emergency room on October 30 for new onset atrial fibrillation and was placed on Eliquis. She has been seen by Dr. Nadara EatonGangi for 2-D echo ,stress test and ultrasound of the heart however I do not have access to those results. Her last exacerbation of MS was over 3 years ago. She has had no new numbness, visual changes speech changes increased confusion. She returns for reevaluation  09/30/15 KWMs. Jillian Evensdwards is a 70 year old right-handed black female with a history of multiple sclerosis on Betaseron. The patient is tolerating the medication quite well. She has not had any change in her clinical condition since last seen. She has had MRI evaluation of the brain done in February 2016, this showed no change from 2014. The patient reports no new numbness, vision changes, weakness, or changes in bowel or bladder function. She has a chronic gait disorder, she uses a cane for ambulation. 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 She denies any new medical issues that have come up since last seen.   REVIEW OF SYSTEMS: Full 14 system review of systems performed and notable only for those listed, all others are neg:  Constitutional: neg  Cardiovascular: neg Ear/Nose/Throat: neg  Skin: neg Eyes: neg Respiratory: neg Gastroitestinal: neg  Hematology/Lymphatic: neg    Endocrine: neg Musculoskeletal: Walking difficulty Allergy/Immunology: Environmental allergies Neurological: Right lower extremity weakness Psychiatric: neg Sleep : neg   ALLERGIES: Allergies  Allergen Reactions  . Sulfa Antibiotics Rash    HOME MEDICATIONS: + PAST MEDICAL HISTORY: Past Medical History:  Diagnosis Date  . Abnormality of gait 01/25/2013  . Arthritis   . GERD (gastroesophageal reflux disease)   . Hypercholesteremia   . Hypertension   . Hypothyroidism   . Multiple sclerosis (HCC)   . Neurogenic bladder 01/25/2013    PAST SURGICAL HISTORY: Past Surgical History:  Procedure Laterality Date  . ABDOMINAL HYSTERECTOMY      FAMILY HISTORY: Family History  Problem Relation Age of Onset  . Heart attack Mother   . Multiple sclerosis Sister   . Heart disease Sister   . Diabetes Other     SOCIAL HISTORY: Social History   Social History  . Marital status: Married    Spouse name: Fayrene Fearingjames  . Number of children: 2  . Years of education: 8 th   Occupational History  . retired    Social History Main Topics  . Smoking status: Never Smoker  . Smokeless tobacco: Never Used     Comment: quit 30-40 years ago (documented in 2013)  . Alcohol use No  . Drug use: No  . Sexual activity: Not on file   Other Topics Concern  . Not on file   Social History Narrative   Patient lives at home with her husband.   Retired.   Education 8 th grade   Right handed.   Caffeine none.  PHYSICAL EXAM  Vitals:   02/24/16 1308  BP: 120/60  Pulse: 80  Weight: 193 lb (87.5 kg)  Height: 5\' 4"  (1.626 m)   Body mass index is 33.13 kg/m.  Generalized: Well developed, Obese female in no acute distress  Head: normocephalic and atraumatic,. Oropharynx benign  Neck: Supple, no carotid bruits  Cardiac: Irregularly irregular  rate , no murmur  Musculoskeletal: No deformity   Neurological examination   Mentation: Alert oriented to time, place, history taking.  Attention span and concentration appropriate. Recent and remote memory intact.  Follows all commands speech and language fluent.   Cranial nerve II-XII: Fundoscopic exam reveals sharp disc margins.Pupils were equal round reactive to light extraocular movements were full, visual field were full on confrontational test. Facial sensation and strength were normal. hearing was intact to finger rubbing bilaterally. Uvula tongue midline. head turning and shoulder shrug were normal and symmetric.Tongue protrusion into cheek strength was normal. Motor: normal bulk and tone, full strength in the BUE, BLE, except 3 out of 5 right lower extremity and foot drop  Sensory: normal and symmetric to light touch, pinprick, and  Vibration,  in the upper and lower extremities Coordination: finger-nose-finger, heel-to-shin bilaterally, no dysmetria Reflexes: 1+ upper lower and symmetric plantar responses were flexor bilaterally. Gait and Station: Seated in wheelchair, 2+ assist to stand , weakness on the right side only ambulated a few steps   DIAGNOSTIC DATA (LABS, IMAGING, TESTING) - I reviewed patient records, labs, notes, testing and imaging myself where available.  Lab Results  Component Value Date   WBC 7.5 02/02/2016   HGB 13.7 02/02/2016   HCT 41.6 02/02/2016   MCV 90.2 02/02/2016   PLT 199 02/02/2016      Component Value Date/Time   NA 140 02/02/2016 1120   NA 139 01/09/2015 0915   K 3.4 (L) 02/02/2016 1120   CL 106 02/02/2016 1120   CO2 25 02/02/2016 1120   GLUCOSE 119 (H) 02/02/2016 1120   BUN 11 02/02/2016 1120   BUN 15 01/09/2015 0915   CREATININE 0.97 02/02/2016 1120   CALCIUM 9.5 02/02/2016 1120   PROT 7.9 01/09/2015 0915   ALBUMIN 4.0 01/09/2015 0915   AST 21 01/09/2015 0915   ALT 13 01/09/2015 0915   ALKPHOS 61 01/09/2015 0915   BILITOT 0.3 01/09/2015 0915   GFRNONAA 58 (L) 02/02/2016 1120   GFRAA >60 02/02/2016 1120    Lab Results  Component Value Date   TSH 1.904 02/02/2016        ASSESSMENT AND PLAN  70 y.o. year old female  has a past medical history of Abnormality of gait (01/25/2013); ); Hypercholesteremia; Hypertension;  Multiple sclerosis (HCC); and Neurogenic bladder (01/25/2013). with acute onset of weakness this  morning upon awakening right lower leg. She had an ER visit on 02/02/2016 for new onset atrial fibrillation and was placed on Eliquis. She has had 2-D echo stress test and ultrasound of the heart by Dr. Nadara Eaton. I do not have access to those records  Seen in consult with Dr. Anne Hahn He asked the  patient to go to the ER and get  MRI of the brain with and without to rule out acute stroke ER was called and told the patient would be coming. If stroke is ruled out then we can set up patient for steroids for MS exacerbation. If patient has had a stroke she will need physical therapy. Continue Eliquis and other current medications Nilda Riggs, GNP, [facility], APRN  [facility] [address] [place], [place] [address] [phone]

## 2016-02-24 NOTE — ED Notes (Signed)
Pt ambulated per RN request. Pt able to ambulate with assistance and states that she relies on a walker at home for ambulation.

## 2016-02-24 NOTE — Consult Note (Signed)
Neurology Consultation Reason for Consult: Right leg weakness Referring Physician: Pfeiffer,m   CC: Right leg weakness  History is obtained from:patient, daughter  HPI: Jillian Fisher  is a 70 year old female with a history of multiple sclerosis with underlying right sided weakness at baseline who presents with worsening of her right-sided weakness. She states that it was worse than typical on awakening this morning. She states that at baseline, on a scale of 1-10 her right leg as a 7(10 being best) and currently is a 6. Despite this being a relatively small change, however, she states that it is providing a significant functional limitation for her and she does not feel that she is safe at home. Due to this, she went to see her neurologist in the office today who was concerned for possible stroke given her new atrial fibrillation and therefore referred her to the emergency room to rule this out.  In the ER, she got an MRI of her brain with/without contrast. This did not show any evidence of stroke or enhancement to suggest active MS.   Also of note, she has a mild elevation in her temperature at 99.8 and a leukocytosis. She denies any symptoms of any type of infectious nature, but does have urinalysis with positive leukocytes.     ROS: A 14 point ROS was performed and is negative except as noted in the HPI.   Past Medical History:  Diagnosis Date  . Abnormality of gait 01/25/2013  . Arthritis   . GERD (gastroesophageal reflux disease)   . Hypercholesteremia   . Hypertension   . Hypothyroidism   . Multiple sclerosis (HCC)   . Neurogenic bladder 01/25/2013     Family History  Problem Relation Age of Onset  . Heart attack Mother   . Multiple sclerosis Sister   . Heart disease Sister   . Diabetes Other      Social History:  reports that she has never smoked. She has never used smokeless tobacco. She reports that she does not drink alcohol or use drugs.   Exam: Current  vital signs: BP 166/90   Pulse 91   Temp 99.3 F (37.4 C)   Resp 17   SpO2 97%  Vital signs in last 24 hours: Temp:  [99.3 F (37.4 C)-99.8 F (37.7 C)] 99.3 F (37.4 C) (11/21 1803) Pulse Rate:  [36-136] 91 (11/21 2145) Resp:  [10-23] 17 (11/21 2145) BP: (120-166)/(60-97) 166/90 (11/21 2145) SpO2:  [97 %-100 %] 97 % (11/21 2145) Weight:  [87.5 kg (193 lb)] 87.5 kg (193 lb) (11/21 1308)   Physical Exam  Constitutional: Appears well-developed and well-nourished.  Psych: Affect appropriate to situation Eyes: No scleral injection HENT: No OP obstrucion Head: Normocephalic.  Cardiovascular: Normal rate and regular rhythm.  Respiratory: Effort normal and breath sounds normal to anterior ascultation GI: Soft.  No distension. There is no tenderness.  Skin: WDI  Neuro: Mental Status: Patient is awake, alert, oriented to person, place, month, year, and situation. Patient is able to give a clear and coherent history. No signs of aphasia or neglect Cranial Nerves: II: Visual Fields are full. Pupils are equal, round, and reactive to light.   III,IV, VI: EOMI without ptosis or diploplia.  V: Facial sensation is symmetric to temperature VII: Facial movement is symmetric.  VIII: hearing is intact to voice X: Uvula elevates symmetrically XI: Shoulder shrug is symmetric. XII: tongue is midline without atrophy or fasciculations.  Motor: Tone is normal. Bulk is normal. 5/5 strength was  present in The left arm, 4+/5 in the right Arm and 4 minus/5 in the right leg Sensory: Sensation is Diminished circumferentially in th Deep Tendon Reflexes: She has brisk patellar reflexes bilaterally, with crossed adducto Plantars: Toes are Downgoing on the left, equivocal on the right Cerebellar: FNF Intact bilaterally   I have reviewed labs in epic and the results pertinent to this consultation are: BMP-unremarkable UA 6-30 WBC, though? Contamination  I have reviewed the images obtained: MRI  brain-chronic demyelination but no acute   Impression: 70 year old female with mild worsening of her underlying MS symptoms. Possibilities include spinal MS vs exacerbation in the setting of mild infection. Though the UA was questionable, with leukocytosis and low-grade temperature elevation I would favor treatment rather than empiric steroid therapy with culture, though could also repeat UA.   Recommendations: 1) Treatment of UTI per internal medicine 2) Physical therapy given that she does not feel safe to go home.  3) if her symptoms do not improve, Then could perform contrasted MRI C and T spine, but this cannot be done for 48 hour her previous contrast dose. 4) Continue eliquis for atrial fibrillation 5) neurology will continue to follow  Ritta Slot, MD [facility] [phone]  If 7pm- 7am, please page neurology on call as listed in AMION.

## 2016-02-24 NOTE — ED Triage Notes (Signed)
Pt from home with c/o difficulty "getting around today" due to her MS.  Pt reports no other complaints except some mild left shoulder pain.  NAD, A&O.

## 2016-02-24 NOTE — Progress Notes (Signed)
I have read the note, and I agree with the clinical assessment and plan.  Jillian Fisher   

## 2016-02-24 NOTE — Telephone Encounter (Signed)
Returned call to pt's daughter. Left VM mssg on dtr's cell offering appt w/ Eber Jones NP @ 1:30. Also called and spoke to pt who says that she is not feeling well at all today and would like to be seen. After speaking to [place], pt was placed on her schedule this afternoon.

## 2016-02-24 NOTE — Telephone Encounter (Signed)
Pt's daughter called in stating pt is having a hard time moving today. She needed a lot of help getting out of bed and going to the bathroom this morning. Daughter wants to know if she can be brought in today. Please call and advise [phone]- [name]

## 2016-02-24 NOTE — ED Notes (Signed)
Patient transported to MRI 

## 2016-02-25 ENCOUNTER — Encounter (HOSPITAL_COMMUNITY): Payer: Self-pay | Admitting: Internal Medicine

## 2016-02-25 DIAGNOSIS — I480 Paroxysmal atrial fibrillation: Secondary | ICD-10-CM | POA: Diagnosis present

## 2016-02-25 DIAGNOSIS — N390 Urinary tract infection, site not specified: Principal | ICD-10-CM

## 2016-02-25 DIAGNOSIS — R29898 Other symptoms and signs involving the musculoskeletal system: Secondary | ICD-10-CM

## 2016-02-25 LAB — BASIC METABOLIC PANEL
Anion gap: 10 (ref 5–15)
BUN: 12 mg/dL (ref 6–20)
CHLORIDE: 104 mmol/L (ref 101–111)
CO2: 26 mmol/L (ref 22–32)
CREATININE: 1.01 mg/dL — AB (ref 0.44–1.00)
Calcium: 9.3 mg/dL (ref 8.9–10.3)
GFR, EST NON AFRICAN AMERICAN: 55 mL/min — AB (ref >60)
Glucose, Bld: 110 mg/dL — ABNORMAL HIGH (ref 65–99)
Potassium: 4.6 mmol/L (ref 3.5–5.1)
SODIUM: 140 mmol/L (ref 135–145)

## 2016-02-25 LAB — CBC
HCT: 41.3 % (ref 36.0–46.0)
Hemoglobin: 13.4 g/dL (ref 12.0–15.0)
MCH: 29 pg (ref 26.0–34.0)
MCHC: 32.4 g/dL (ref 30.0–36.0)
MCV: 89.4 fL (ref 78.0–100.0)
PLATELETS: 186 10*3/uL (ref 150–400)
RBC: 4.62 MIL/uL (ref 3.87–5.11)
RDW: 13.3 % (ref 11.5–15.5)
WBC: 8.6 10*3/uL (ref 4.0–10.5)

## 2016-02-25 LAB — TSH: TSH: 1.006 u[IU]/mL (ref 0.350–4.500)

## 2016-02-25 MED ORDER — DILTIAZEM HCL ER COATED BEADS 240 MG PO CP24
240.0000 mg | ORAL_CAPSULE | Freq: Every day | ORAL | Status: DC
  Administered 2016-02-25 – 2016-02-27 (×3): 240 mg via ORAL
  Filled 2016-02-25 (×3): qty 1

## 2016-02-25 MED ORDER — ONDANSETRON HCL 4 MG/2ML IJ SOLN: 4.0000 mg | Freq: Four times a day (QID) | INTRAMUSCULAR | Status: DC | PRN

## 2016-02-25 MED ORDER — ROSUVASTATIN CALCIUM 10 MG PO TABS
10.0000 mg | ORAL_TABLET | Freq: Every day | ORAL | Status: DC
  Administered 2016-02-25 – 2016-02-27 (×3): 10 mg via ORAL
  Filled 2016-02-25 (×3): qty 1

## 2016-02-25 MED ORDER — INTERFERON BETA-1B 0.3 MG ~~LOC~~ KIT: 0.2500 mg | PACK | SUBCUTANEOUS | Status: DC

## 2016-02-25 MED ORDER — ALPRAZOLAM 0.25 MG PO TABS: 0.2500 mg | ORAL_TABLET | Freq: Every day | ORAL | Status: DC | PRN

## 2016-02-25 MED ORDER — TRAMADOL HCL 50 MG PO TABS
50.0000 mg | ORAL_TABLET | Freq: Four times a day (QID) | ORAL | Status: DC | PRN
  Administered 2016-02-25 – 2016-02-27 (×4): 50 mg via ORAL
  Filled 2016-02-25 (×4): qty 1

## 2016-02-25 MED ORDER — PANTOPRAZOLE SODIUM 40 MG PO TBEC
40.0000 mg | DELAYED_RELEASE_TABLET | Freq: Every day | ORAL | Status: DC
  Administered 2016-02-25 – 2016-02-27 (×3): 40 mg via ORAL
  Filled 2016-02-25 (×3): qty 1

## 2016-02-25 MED ORDER — ADULT MULTIVITAMIN W/MINERALS CH
1.0000 | ORAL_TABLET | Freq: Every day | ORAL | Status: DC
  Administered 2016-02-25 – 2016-02-27 (×3): 1 via ORAL
  Filled 2016-02-25 (×2): qty 1

## 2016-02-25 MED ORDER — ACETAMINOPHEN 650 MG RE SUPP
650.0000 mg | Freq: Four times a day (QID) | RECTAL | Status: DC | PRN
  Filled 2016-02-25: qty 1

## 2016-02-25 MED ORDER — SODIUM CHLORIDE 0.9 % IV SOLN
INTRAVENOUS | Status: AC
  Administered 2016-02-25: 02:00:00 via INTRAVENOUS

## 2016-02-25 MED ORDER — ACETAMINOPHEN 325 MG PO TABS
650.0000 mg | ORAL_TABLET | Freq: Four times a day (QID) | ORAL | Status: DC | PRN
  Administered 2016-02-25: 650 mg via ORAL
  Filled 2016-02-25: qty 2

## 2016-02-25 MED ORDER — ONDANSETRON HCL 4 MG PO TABS: 4.0000 mg | ORAL_TABLET | Freq: Four times a day (QID) | ORAL | Status: DC | PRN

## 2016-02-25 MED ORDER — APIXABAN 5 MG PO TABS
5.0000 mg | ORAL_TABLET | Freq: Two times a day (BID) | ORAL | Status: DC
  Administered 2016-02-25 – 2016-02-27 (×5): 5 mg via ORAL
  Filled 2016-02-25 (×5): qty 1

## 2016-02-25 MED ORDER — LEVOTHYROXINE SODIUM 100 MCG PO TABS
125.0000 ug | ORAL_TABLET | Freq: Every day | ORAL | Status: DC
  Administered 2016-02-25 – 2016-02-27 (×3): 125 ug via ORAL
  Filled 2016-02-25 (×3): qty 1

## 2016-02-25 MED ORDER — DEXTROSE 5 % IV SOLN
1.0000 g | INTRAVENOUS | Status: DC
  Administered 2016-02-25: 1 g via INTRAVENOUS
  Filled 2016-02-25 (×2): qty 10

## 2016-02-25 MED ORDER — OXYBUTYNIN CHLORIDE 5 MG PO TABS
5.0000 mg | ORAL_TABLET | Freq: Every day | ORAL | Status: DC
  Administered 2016-02-25 – 2016-02-26 (×3): 5 mg via ORAL
  Filled 2016-02-25 (×3): qty 1

## 2016-02-25 MED ORDER — ENOXAPARIN SODIUM 40 MG/0.4ML ~~LOC~~ SOLN: 40.0000 mg | SUBCUTANEOUS | Status: DC

## 2016-02-25 NOTE — Progress Notes (Signed)
[facility] PROGRESS NOTE    Progress Note  Jillian Fisher  ZOX:[id number] DOB: Jan 24, 1946 DOA: 02/24/2016 PCP: Lupe Carney, MD     Brief Narrative:   Jillian Fisher is an 70 y.o. female  past medical history of multiple sclerosis, newly diagnosed atrial fibrillation on apixiban the comes in complaining of lower right lower extremity weakness since the morning of admission, an MRI of the brain was done that showed no acute findings  Assessment/Plan:   Right leg weakness probable due to UTI: - I agree with IV Rocephin urine cultures and blood cultures pending. - History of lower extremity weakness does not improve repeat an MRI of the spine to rule out spinal involvement.   Multiple sclerosis (HCC): - Continue current regimen. - PT consult pending.  Hypothyroidism:  - Continue Synthroid.  PAF (paroxysmal atrial fibrillation) (HCC) Rate control continue Eluquis.   DVT prophylaxis: lovenox Family Communication:none Disposition Plan/Barrier to D/C: home in 3 days Code Status:     Code Status Orders        Start     Ordered   02/25/16 0140  Full code  Continuous     02/25/16 0140    Code Status History    Date Active Date Inactive Code Status Order ID Comments User Context   This patient has a current code status but no historical code status.        IV Access:    Peripheral IV   Procedures and diagnostic studies:   Mr Jillian Fisher And Wo Contrast  Result Date: 02/24/2016 CLINICAL DATA:  New onset RIGHT lower extremity weakness today. Recent diagnosis of atrial fibrillation, on Eliquis. History of multiple sclerosis, last exacerbation was 3 years ago. History of hypertension and hypercholesterolemia. EXAM: MRI HEAD WITHOUT AND WITH CONTRAST TECHNIQUE: Multiplanar, multiecho pulse sequences of the brain and surrounding structures were obtained without and with intravenous contrast. CONTRAST:  19mL MULTIHANCE GADOBENATE DIMEGLUMINE 529 MG/ML IV SOLN  COMPARISON:  MRI of the brain May 28, 2014 FINDINGS: INTRACRANIAL CONTENTS: No reduced diffusion to suggest acute ischemia or hyperacute demyelination. Faint T2 shine through LEFT posterior frontal lobe. No susceptibility artifact to suggest acute hemorrhage. Chronic LEFT LEFT basal ganglia microhemorrhage. The ventricles and sulci are normal for patient's age. Confluent supratentorial white matter FLAIR T2 hyperintensities, many of which radiate from the periventricular margin with low T1 signal compatible with black holes of demyelination, unchanged. Subtle T2 bright signal abnormality in the basal ganglia and LEFT thalamus is unchanged. Scattered frontoparietal cortical lesions. No abnormal intraparenchymal or extra-axial enhancement. No abnormal extra-axial fluid collections. No extra-axial masses. VASCULAR: Normal major intracranial vascular flow voids present at skull base. SKULL AND UPPER CERVICAL SPINE: No abnormal sellar expansion. No suspicious calvarial bone marrow signal. Craniocervical junction maintained. SINUSES/ORBITS: The mastoid air-cells and included paranasal sinuses are well-aerated.The included ocular globes and orbital contents are non-suspicious. OTHER: None. IMPRESSION: No acute intracranial process. Severe chronic demyelination, stable from prior examination without enhancement to suggest acute demyelination. No parenchymal brain volume loss for age. Electronically Signed   By: Awilda Metro M.D.   On: 02/24/2016 21:17     Medical Consultants:    None.  Anti-Infectives:   Rocephin  Subjective:    Jillian Fisher she relates her lower extremity weakness is about same, some suprapubic tenderness, she does relate incontinence with sneezing.  Objective:    Vitals:   02/25/16 0015 02/25/16 0058 02/25/16 0101 02/25/16 0442  BP: 153/67  (!) 158/62 (!) 137/54  Pulse: 78  80 82  Resp: 18  17 16   Temp:   99.1 F (37.3 C) 99.4 F (37.4 C)  TempSrc:   Oral Oral    SpO2: 96%  96% 97%  Weight:  84.9 kg (187 lb 2.7 oz)    Height:  5\' 4"  (1.626 m)      Intake/Output Summary (Last 24 hours) at 02/25/16 0844 Last data filed at 02/25/16 9604  Gross per 24 hour  Intake           436.67 ml  Output              100 ml  Net           336.67 ml   Filed Weights   02/25/16 0058  Weight: 84.9 kg (187 lb 2.7 oz)    Exam: General exam: In no acute distress. Respiratory system: Good air movement and clear to auscultation. Cardiovascular system: S1 & S2 heard, RRR. No JVD. Gastrointestinal system: Abdomen is nondistended, Soft with suprapubic tenderness. Extremities: No pedal edema. Skin: No rashes, lesions or ulcers Psychiatry: Judgement and insight appear normal. Mood & affect appropriate.    Data Reviewed:    Labs: Basic Metabolic Panel:  Recent Labs Lab 02/24/16 1523 02/25/16 0234  NA 138 140  K 4.6 4.6  CL 105 104  CO2 25 26  GLUCOSE 116* 110*  BUN 13 12  CREATININE 1.05* 1.01*  CALCIUM 9.4 9.3   GFR Estimated Creatinine Clearance: 55.4 mL/min (by C-G formula based on SCr of 1.01 mg/dL (H)). Liver Function Tests: No results for input(s): AST, ALT, ALKPHOS, BILITOT, PROT, ALBUMIN in the last 168 hours. No results for input(s): LIPASE, AMYLASE in the last 168 hours. No results for input(s): AMMONIA in the last 168 hours. Coagulation profile No results for input(s): INR, PROTIME in the last 168 hours.  CBC:  Recent Labs Lab 02/24/16 1523 02/25/16 0234  WBC 11.5* 8.6  HGB 13.2 13.4  HCT 40.5 41.3  MCV 90.6 89.4  PLT 197 186   Cardiac Enzymes: No results for input(s): CKTOTAL, CKMB, CKMBINDEX, TROPONINI in the last 168 hours. BNP (last 3 results) No results for input(s): PROBNP in the last 8760 hours. CBG:  Recent Labs Lab 02/24/16 1816  GLUCAP 120*   D-Dimer: No results for input(s): DDIMER in the last 72 hours. Hgb A1c: No results for input(s): HGBA1C in the last 72 hours. Lipid Profile: No results for  input(s): CHOL, HDL, LDLCALC, TRIG, CHOLHDL, LDLDIRECT in the last 72 hours. Thyroid function studies:  Recent Labs  02/25/16 0234  TSH 1.006   Anemia work up: No results for input(s): VITAMINB12, FOLATE, FERRITIN, TIBC, IRON, RETICCTPCT in the last 72 hours. Sepsis Labs:  Recent Labs Lab 02/24/16 1523 02/25/16 0234  WBC 11.5* 8.6   Microbiology No results found for this or any previous visit (from the past 240 hour(s)).   Medications:   . apixaban  5 mg Oral BID  . cefTRIAXone (ROCEPHIN)  IV  1 g Intravenous Q24H  . diltiazem  240 mg Oral Daily  . Interferon Beta-1b  0.25 mg Subcutaneous QODAY  . levothyroxine  125 mcg Oral QAC breakfast  . multivitamin with minerals  1 tablet Oral Daily  . oxybutynin  5 mg Oral QHS  . pantoprazole  40 mg Oral Daily  . rosuvastatin  10 mg Oral Daily   Continuous Infusions: . sodium chloride 50 mL/hr at 02/25/16 0219    Time spent: 25 min  LOS: 1 day   Marinda ElkFELIZ ORTIZ, Daryle Amis  [facility] Pager [phone]  *Please refer to [email or web address], password [email or web address] to get updated schedule on who will round on this patient, as hospitalists switch teams weekly. If 7PM-7AM, please contact night-coverage at [email or web address], password [email or web address] for any overnight needs.  02/25/2016, 8:44 AM

## 2016-02-25 NOTE — Progress Notes (Signed)
Physical Therapy Treatment Patient Details Name: Jillian Fisher MRN: [id number] DOB: [birth year] Today's Date: 02/25/2016    History of Present Illness  Jillian Fisher is a 70 y.o. female with history of multiple sclerosis, newly diagnosed A. fib, hyperlipidemia and hypothyroidism, admitted with right lower extremity weakness since yesterday morning..  Imaging showed no acute process, but she is suspect fro UTI.    PT Comments    Pt admitted with/for R leg weakness and found to have probable UTI, but no acute process on imaging.  Pt currently limited functionally due to the problems listed below.  (see problems list.)  Pt will benefit from PT to maximize function and safety to be able to get home safely with available assist of family.   Follow Up Recommendations  No PT follow up     Equipment Recommendations  None recommended by PT    Recommendations for Other Services       Precautions / Restrictions Precautions Precautions: Fall    Mobility  Bed Mobility Overal bed mobility: Needs Assistance Bed Mobility: Supine to Sit     Supine to sit: Min assist     General bed mobility comments: asssit to come forward. Once up enough to get her R UE in a position to help, she needed no more assist  Transfers Overall transfer level: Needs assistance   Transfers: Sit to/from Stand Sit to Stand: Min guard         General transfer comment: cues for hand placement  Ambulation/Gait Ambulation/Gait assistance: Min guard Ambulation Distance (Feet): 15 Feet (then additional 100 feet with RW) Assistive device: Rolling walker (2 wheeled) Gait Pattern/deviations: Step-through pattern   Gait velocity interpretation: Below normal speed for age/gender General Gait Details: generally steady within the RW, but did not use RW well in turns or in tighter space.   Stairs            Wheelchair Mobility    Modified Rankin (Stroke Patients Only)       Balance Overall  balance assessment: Needs assistance   Sitting balance-Leahy Scale: Fair       Standing balance-Leahy Scale: Fair Standing balance comment: for gait needed RW                    Cognition Arousal/Alertness: Awake/alert Behavior During Therapy: WFL for tasks assessed/performed Overall Cognitive Status: Within Functional Limits for tasks assessed                      Exercises      General Comments        Pertinent Vitals/Pain Pain Assessment: Faces Faces Pain Scale: No hurt    Home Living                      Prior Function            PT Goals (current goals can now be found in the care plan section) Acute Rehab PT Goals Patient Stated Goal: back home as soon as possible Progress towards PT goals: Progressing toward goals    Frequency    Min 3X/week      PT Plan Current plan remains appropriate    Co-evaluation             End of Session   Activity Tolerance: Patient tolerated treatment well Patient left: in chair;with call bell/phone within reach;with chair alarm set     Time: 8786-7672 PT Time Calculation (min) (ACUTE  ONLY): 24 min  Charges:  $Gait Training: 8-22 mins                    G Codes:      Eliseo GumKenneth V Netty Sullivant 02/25/2016, 1:40 PM 02/25/2016  [place] BingKen Jobe Mutch, PT [phone]  (pager)

## 2016-02-25 NOTE — Progress Notes (Signed)
Subjective: Interval History: Patient is feeling better today.  Thinks right side weakness is nearly back to baseline.  Objective: Vital signs in last 24 hours: Temp:  [97.7 F (36.5 C)-99.4 F (37.4 C)] 97.7 F (36.5 C) (11/22 1253) Pulse Rate:  [36-136] 73 (11/22 1253) Resp:  [10-27] 20 (11/22 1253) BP: (137-166)/(54-97) 147/56 (11/22 1253) SpO2:  [96 %-100 %] 97 % (11/22 1253) Weight:  [84.9 kg (187 lb 2.7 oz)] 84.9 kg (187 lb 2.7 oz) (11/22 0058)  Intake/Output from previous day: 11/21 0701 - 11/22 0700 In: 436.7 [P.O.:120; I.V.:216.7; IV Piggyback:100] Out: 100 [Urine:100] Intake/Output this shift: Total I/O In: 200 [P.O.:200] Out: 150 [Urine:150] Nutritional status: Diet Heart Room service appropriate? Yes; Fluid consistency: Thin  Neuro Exam MS - normal CN - intact Motor - has mild right arm drift and slight decrease grip vs left; right leg is nearly as strong as left Sensory - decreased on right DTR - increased throughout  Lab Results:  Recent Labs  02/24/16 1523 02/25/16 0234  WBC 11.5* 8.6  HGB 13.2 13.4  HCT 40.5 41.3  PLT 197 186  NA 138 140  K 4.6 4.6  CL 105 104  CO2 25 26  GLUCOSE 116* 110*  BUN 13 12  CREATININE 1.05* 1.01*  CALCIUM 9.4 9.3   Lipid Panel No results for input(s): CHOL, TRIG, HDL, CHOLHDL, VLDL, LDLCALC in the last 72 hours.  Studies/Results: Mr Laqueta Jean And Wo Contrast  Result Date: 02/24/2016 CLINICAL DATA:  New onset RIGHT lower extremity weakness today. Recent diagnosis of atrial fibrillation, on Eliquis. History of multiple sclerosis, last exacerbation was 3 years ago. History of hypertension and hypercholesterolemia. EXAM: MRI HEAD WITHOUT AND WITH CONTRAST TECHNIQUE: Multiplanar, multiecho pulse sequences of the brain and surrounding structures were obtained without and with intravenous contrast. CONTRAST:  30mL MULTIHANCE GADOBENATE DIMEGLUMINE 529 MG/ML IV SOLN COMPARISON:  MRI of the brain May 28, 2014 FINDINGS:  INTRACRANIAL CONTENTS: No reduced diffusion to suggest acute ischemia or hyperacute demyelination. Faint T2 shine through LEFT posterior frontal lobe. No susceptibility artifact to suggest acute hemorrhage. Chronic LEFT LEFT basal ganglia microhemorrhage. The ventricles and sulci are normal for patient's age. Confluent supratentorial white matter FLAIR T2 hyperintensities, many of which radiate from the periventricular margin with low T1 signal compatible with black holes of demyelination, unchanged. Subtle T2 bright signal abnormality in the basal ganglia and LEFT thalamus is unchanged. Scattered frontoparietal cortical lesions. No abnormal intraparenchymal or extra-axial enhancement. No abnormal extra-axial fluid collections. No extra-axial masses. VASCULAR: Normal major intracranial vascular flow voids present at skull base. SKULL AND UPPER CERVICAL SPINE: No abnormal sellar expansion. No suspicious calvarial bone marrow signal. Craniocervical junction maintained. SINUSES/ORBITS: The mastoid air-cells and included paranasal sinuses are well-aerated.The included ocular globes and orbital contents are non-suspicious. OTHER: None. IMPRESSION: No acute intracranial process. Severe chronic demyelination, stable from prior examination without enhancement to suggest acute demyelination. No parenchymal brain volume loss for age. Electronically Signed   By: Awilda Metro M.D.   On: 02/24/2016 21:17    Medications: I have reviewed the patient's current medications.  Assessment/Plan: 1. MS  Patient did not have a true flare-up of her MS but rather a worsening of baseline symptoms/deficits due to intercurrent infection/illness.  This has improved with abx.  She will not require further work-up. Neurology will sign-off.  She can follow-up with neurology as outpatient.  2. UTI    LOS: 1 day   Bearl Mulberry, MD Neurology

## 2016-02-25 NOTE — H&P (Signed)
History and Physical    Jillian Fisher CBU:[id number] DOB: 04-06-1945 DOA: 02/24/2016  PCP: Donnie Coffin, MD  Patient coming from: Home.  Chief Complaint: Right lower extremity weakness.  HPI: Jillian Fisher is a 70 y.o. female with history of multiple sclerosis, newly diagnosed A. fib, hyperlipidemia and hypothyroidism started to experience right lower extremity weakness since yesterday morning. Since patient has had newly diagnosed A. fib patient's neurologist advised to come to the ER to rule out stroke. In the ER patient had MRI of the brain with and without contrast which did not show anything acute. On-call neurologist was consulted. Patient was found to be mildly febrile with leukocytosis and UA showing many bacteria and leukocytes. At this time neurologist feels patient's symptoms may be due to possible UTI and has been admitted for further observation and management.  Patient states she usually ambulates with the help of the walker and since yesterday morning was unable to ambulate. Denies any nausea vomiting diarrhea chest pain or shortness of breath.   ED Course: MRI brain with and without contrast was done and patient was started on empiric antibiotics for UTI. Neurology was consulted.  Review of Systems: As per HPI, rest all negative.   Past Medical History:  Diagnosis Date  . Abnormality of gait 01/25/2013  . Arthritis   . GERD (gastroesophageal reflux disease)   . Hypercholesteremia   . Hypertension   . Hypothyroidism   . Multiple sclerosis ([place])   . Neurogenic bladder 01/25/2013    Past Surgical History:  Procedure Laterality Date  . ABDOMINAL HYSTERECTOMY       reports that she has never smoked. She has never used smokeless tobacco. She reports that she does not drink alcohol or use drugs.  Allergies  Allergen Reactions  . Sulfa Antibiotics Rash    Family History  Problem Relation Age of Onset  . Heart attack Mother   . Multiple sclerosis Sister    . Heart disease Sister   . Diabetes Other     Prior to Admission medications   Medication Sig Start Date End Date Taking? Authorizing Provider  acetaminophen (TYLENOL) 500 MG tablet Take 1,000 mg by mouth every 6 (six) hours as needed. For pain   Yes Historical Provider, MD  ALPRAZolam (XANAX) 0.25 MG tablet Take 0.25 mg by mouth daily as needed for anxiety. 12/25/15  Yes Historical Provider, MD  apixaban (ELIQUIS) 5 MG TABS tablet Take 1 tablet (5 mg total) by mouth 2 (two) times daily. 02/02/16 03/03/16 Yes Margette Fast, MD  diltiazem (CARDIZEM CD) 240 MG 24 hr capsule Take 1 capsule (240 mg total) by mouth daily. 02/02/16  Yes Margette Fast, MD  estropipate (OGEN) 0.75 MG tablet TAKE 1 TABLET DAILY FOR THREE WEEKS, 1 WEEK OFF ORALLY 06/29/15  Yes Historical Provider, MD  Interferon Beta-1b (BETASERON) 0.3 MG KIT injection Inject 0.25 mg into the skin every other day. 09/05/15  Yes Kathrynn Ducking, MD  levothyroxine (SYNTHROID, LEVOTHROID) 125 MCG tablet Take 125 mcg by mouth daily. 12/11/14  Yes Historical Provider, MD  Multiple Vitamin (MULTIVITAMIN WITH MINERALS) TABS Take 1 tablet by mouth daily.   Yes Historical Provider, MD  omeprazole (PRILOSEC) 20 MG capsule Take 20 mg by mouth daily.   Yes Historical Provider, MD  oxybutynin (DITROPAN) 5 MG tablet Take 5 mg by mouth at bedtime.    Yes Historical Provider, MD  rosuvastatin (CRESTOR) 20 MG tablet Take 10 mg by mouth daily.  Yes Historical Provider, MD  cephALEXin (KEFLEX) 500 MG capsule Take 2 capsules (1,000 mg total) by mouth 2 (two) times daily. 02/24/16   Charlesetta Shanks, MD    Physical Exam: Vitals:   02/25/16 0008 02/25/16 0015 02/25/16 0058 02/25/16 0101  BP: 154/73 153/67  (!) 158/62  Pulse: 63 78  80  Resp: (!) '27 18  17  ' Temp:    99.1 F (37.3 C)  TempSrc:    Oral  SpO2: 98% 96%  96%  Weight:   84.9 kg (187 lb 2.7 oz)   Height:   '5\' 4"'  (1.626 m)       Constitutional: Moderately built and nourished. Vitals:    02/25/16 0008 02/25/16 0015 02/25/16 0058 02/25/16 0101  BP: 154/73 153/67  (!) 158/62  Pulse: 63 78  80  Resp: (!) '27 18  17  ' Temp:    99.1 F (37.3 C)  TempSrc:    Oral  SpO2: 98% 96%  96%  Weight:   84.9 kg (187 lb 2.7 oz)   Height:   '5\' 4"'  (1.626 m)    Eyes: Anicteric. No pallor. ENMT: No discharge from the ears eyes nose and mouth. Neck: No neck rigidity. No mass felt. Respiratory: No rhonchi or crepitations. Cardiovascular: S1 and S2 heard. No murmurs appreciated. Abdomen: Soft nontender bowel sounds present. No guarding or rigidity. Musculoskeletal: No edema. Skin: No rash. Neurologic: Alert awake oriented to time place and person. Patient has weakness on the right side more than the right lower extremity. Strength is 3 may find the right upper extremity and 2 were find the right lower extremity. Left upper and lower studies of 5 x 5. Psychiatric: Appears normal. Normal affect.   Labs on Admission: I have personally reviewed following labs and imaging studies  CBC:  Recent Labs Lab 02/24/16 1523  WBC 11.5*  HGB 13.2  HCT 40.5  MCV 90.6  PLT 202   Basic Metabolic Panel:  Recent Labs Lab 02/24/16 1523  NA 138  K 4.6  CL 105  CO2 25  GLUCOSE 116*  BUN 13  CREATININE 1.05*  CALCIUM 9.4   GFR: Estimated Creatinine Clearance: 53.3 mL/min (by C-G formula based on SCr of 1.05 mg/dL (H)). Liver Function Tests: No results for input(s): AST, ALT, ALKPHOS, BILITOT, PROT, ALBUMIN in the last 168 hours. No results for input(s): LIPASE, AMYLASE in the last 168 hours. No results for input(s): AMMONIA in the last 168 hours. Coagulation Profile: No results for input(s): INR, PROTIME in the last 168 hours. Cardiac Enzymes: No results for input(s): CKTOTAL, CKMB, CKMBINDEX, TROPONINI in the last 168 hours. BNP (last 3 results) No results for input(s): PROBNP in the last 8760 hours. HbA1C: No results for input(s): HGBA1C in the last 72 hours. CBG:  Recent Labs Lab  02/24/16 1816  GLUCAP 120*   Lipid Profile: No results for input(s): CHOL, HDL, LDLCALC, TRIG, CHOLHDL, LDLDIRECT in the last 72 hours. Thyroid Function Tests: No results for input(s): TSH, T4TOTAL, FREET4, T3FREE, THYROIDAB in the last 72 hours. Anemia Panel: No results for input(s): VITAMINB12, FOLATE, FERRITIN, TIBC, IRON, RETICCTPCT in the last 72 hours. Urine analysis:    Component Value Date/Time   COLORURINE AMBER (A) 02/24/2016 1529   APPEARANCEUR CLOUDY (A) 02/24/2016 1529   LABSPEC 1.026 02/24/2016 1529   PHURINE 6.0 02/24/2016 1529   GLUCOSEU NEGATIVE 02/24/2016 1529   HGBUR NEGATIVE 02/24/2016 1529   BILIRUBINUR SMALL (A) 02/24/2016 1529   KETONESUR 15 (A) 02/24/2016 1529  PROTEINUR NEGATIVE 02/24/2016 1529   NITRITE NEGATIVE 02/24/2016 1529   LEUKOCYTESUR MODERATE (A) 02/24/2016 1529   Sepsis Labs: '@LABRCNTIP' (procalcitonin:4,lacticidven:4) )No results found for this or any previous visit (from the past 240 hour(s)).   Radiological Exams on Admission: Mr Jeri Cos And Wo Contrast  Result Date: 02/24/2016 CLINICAL DATA:  New onset RIGHT lower extremity weakness today. Recent diagnosis of atrial fibrillation, on Eliquis. History of multiple sclerosis, last exacerbation was 3 years ago. History of hypertension and hypercholesterolemia. EXAM: MRI HEAD WITHOUT AND WITH CONTRAST TECHNIQUE: Multiplanar, multiecho pulse sequences of the brain and surrounding structures were obtained without and with intravenous contrast. CONTRAST:  28m MULTIHANCE GADOBENATE DIMEGLUMINE 529 MG/ML IV SOLN COMPARISON:  MRI of the brain May 28, 2014 FINDINGS: INTRACRANIAL CONTENTS: No reduced diffusion to suggest acute ischemia or hyperacute demyelination. Faint T2 shine through LEFT posterior frontal lobe. No susceptibility artifact to suggest acute hemorrhage. Chronic LEFT LEFT basal ganglia microhemorrhage. The ventricles and sulci are normal for patient's age. Confluent supratentorial white  matter FLAIR T2 hyperintensities, many of which radiate from the periventricular margin with low T1 signal compatible with black holes of demyelination, unchanged. Subtle T2 bright signal abnormality in the basal ganglia and LEFT thalamus is unchanged. Scattered frontoparietal cortical lesions. No abnormal intraparenchymal or extra-axial enhancement. No abnormal extra-axial fluid collections. No extra-axial masses. VASCULAR: Normal major intracranial vascular flow voids present at skull base. SKULL AND UPPER CERVICAL SPINE: No abnormal sellar expansion. No suspicious calvarial bone marrow signal. Craniocervical junction maintained. SINUSES/ORBITS: The mastoid air-cells and included paranasal sinuses are well-aerated.The included ocular globes and orbital contents are non-suspicious. OTHER: None. IMPRESSION: No acute intracranial process. Severe chronic demyelination, stable from prior examination without enhancement to suggest acute demyelination. No parenchymal brain volume loss for age. Electronically Signed   By: CElon AlasM.D.   On: 02/24/2016 21:17     Assessment/Plan Principal Problem:   Right leg weakness Active Problems:   Multiple sclerosis (HCC)   Gait instability   Lower urinary tract infectious disease   PAF (paroxysmal atrial fibrillation) (HCC)   Weakness of right lower extremity    1.  Right lower extremity weakness suspected to be secondary to UTI in a patient with multiple sclerosis - patient is placed on ceftriaxone. Follow-up urine cultures. If weakness does not improve neurology has recommended MRI of the spine with and without contrast which can be done 48 hours later as patient has received contrast. Physical therapy consult. 2. Multiple sclerosis - continue present medications Betaseron. 3. Atrial fibrillation presently rate controlled - patient is on Apixaban and Cardizem. Chads 2 vasc score is 1. 4. Hypothyroidism on Synthroid. 5. Hyperlipidemia on  statins.   DVT prophylaxis: Apixaban. Code Status: Full code.  Family Communication: Discussed with patient.  Disposition Plan: Home.  Consults called: Neurology.  Admission status: Inpatient.    KRise PatienceMD [facility] Pager [phone]  If 7PM-7AM, please contact night-coverage [email or web address] Password [email or web address]  02/25/2016, 1:41 AM

## 2016-02-25 NOTE — Progress Notes (Signed)
Pharmacy Antibiotic Note  Jillian DeisMalinda M Fisher is a 70 y.o. female admitted on 02/24/2016 with UTI.  Pharmacy has been consulted for Ceftriaxone dosing.  Plan: -Ceftriaxone 1g IV q24h -F/U urine cultures for directed therapy  Height: 5\' 4"  (162.6 cm) Weight: 187 lb 2.7 oz (84.9 kg) IBW/kg (Calculated) : 54.7  Temp (24hrs), Avg:99.4 F (37.4 C), Min:99.1 F (37.3 C), Max:99.8 F (37.7 C)   Recent Labs Lab 02/24/16 1523  WBC 11.5*  CREATININE 1.05*    Estimated Creatinine Clearance: 53.3 mL/min (by C-G formula based on SCr of 1.05 mg/dL (H)).    Allergies  Allergen Reactions  . Sulfa Antibiotics Rash    Abran DukeLedford, [place] 02/25/2016 1:48 AM

## 2016-02-26 DIAGNOSIS — G35 Multiple sclerosis: Secondary | ICD-10-CM

## 2016-02-26 LAB — URINE CULTURE

## 2016-02-26 MED ORDER — CIPROFLOXACIN HCL 500 MG PO TABS
500.0000 mg | ORAL_TABLET | Freq: Two times a day (BID) | ORAL | Status: DC
  Administered 2016-02-26 – 2016-02-27 (×2): 500 mg via ORAL
  Filled 2016-02-26 (×2): qty 1

## 2016-02-26 NOTE — Progress Notes (Addendum)
[facility] PROGRESS NOTE    Progress Note  Jillian Fisher  ZOX:[id number] DOB: 1945-06-12 DOA: 02/24/2016 PCP: Lupe Carney, MD    Brief Narrative:   Jillian Fisher is an 70 y.o. female  past medical history of multiple sclerosis, newly diagnosed atrial fibrillation on apixiban the comes in complaining of lower right lower extremity weakness since the morning of admission, an MRI of the brain was done that showed no acute findings  Assessment/Plan:   Right leg weakness probable due to UTI: - Continue IV Rocephin urine cultures was inconclusive, she relates her lower extremity weakness is significantly improved compared to yesterday. - Physical therapy evaluated the patient and recommended no PT follow-up. - We'll change her to oral ciprofloxacin monitor 24 hours.  Multiple sclerosis (HCC): - Continue current regimen. - PT consult , rec no PT follow up.  Hypothyroidism:  - Continue Synthroid.  PAF (paroxysmal atrial fibrillation) (HCC) Rate control continue Eluquis.   DVT prophylaxis: lovenox Family Communication:none Disposition Plan/Barrier to D/C: home in am Code Status:     Code Status Orders        Start     Ordered   02/25/16 0140  Full code  Continuous     02/25/16 0140    Code Status History    Date Active Date Inactive Code Status Order ID Comments User Context   This patient has a current code status but no historical code status.        IV Access:    Peripheral IV   Procedures and diagnostic studies:   Mr Jillian Fisher And Wo Contrast  Result Date: 02/24/2016 CLINICAL DATA:  New onset RIGHT lower extremity weakness today. Recent diagnosis of atrial fibrillation, on Eliquis. History of multiple sclerosis, last exacerbation was 3 years ago. History of hypertension and hypercholesterolemia. EXAM: MRI HEAD WITHOUT AND WITH CONTRAST TECHNIQUE: Multiplanar, multiecho pulse sequences of the brain and surrounding structures were obtained  without and with intravenous contrast. CONTRAST:  19mL MULTIHANCE GADOBENATE DIMEGLUMINE 529 MG/ML IV SOLN COMPARISON:  MRI of the brain May 28, 2014 FINDINGS: INTRACRANIAL CONTENTS: No reduced diffusion to suggest acute ischemia or hyperacute demyelination. Faint T2 shine through LEFT posterior frontal lobe. No susceptibility artifact to suggest acute hemorrhage. Chronic LEFT LEFT basal ganglia microhemorrhage. The ventricles and sulci are normal for patient's age. Confluent supratentorial white matter FLAIR T2 hyperintensities, many of which radiate from the periventricular margin with low T1 signal compatible with black holes of demyelination, unchanged. Subtle T2 bright signal abnormality in the basal ganglia and LEFT thalamus is unchanged. Scattered frontoparietal cortical lesions. No abnormal intraparenchymal or extra-axial enhancement. No abnormal extra-axial fluid collections. No extra-axial masses. VASCULAR: Normal major intracranial vascular flow voids present at skull base. SKULL AND UPPER CERVICAL SPINE: No abnormal sellar expansion. No suspicious calvarial bone marrow signal. Craniocervical junction maintained. SINUSES/ORBITS: The mastoid air-cells and included paranasal sinuses are well-aerated.The included ocular globes and orbital contents are non-suspicious. OTHER: None. IMPRESSION: No acute intracranial process. Severe chronic demyelination, stable from prior examination without enhancement to suggest acute demyelination. No parenchymal brain volume loss for age. Electronically Signed   By: Awilda Metro M.D.   On: 02/24/2016 21:17     Medical Consultants:    None.  Anti-Infectives:   Rocephin  Subjective:    Jillian Fisher she relates her lower extremity weakness is about same, some suprapubic tenderness, she does relate incontinence with sneezing.  Objective:    Vitals:   02/25/16 0442 02/25/16 1253 02/25/16  2222 02/26/16 0507  BP: (!) 137/54 (!) 147/56 (!)  165/51 (!) 179/60  Pulse: 82 73 83 82  Resp: 16 20 18 18   Temp: 99.4 F (37.4 C) 97.7 F (36.5 C) 98.5 F (36.9 C) 98.3 F (36.8 C)  TempSrc: Oral Oral Oral Oral  SpO2: 97% 97% 99% 98%  Weight:      Height:        Intake/Output Summary (Last 24 hours) at 02/26/16 1011 Last data filed at 02/26/16 0758  Gross per 24 hour  Intake           964.17 ml  Output              600 ml  Net           364.17 ml   Filed Weights   02/25/16 0058  Weight: 84.9 kg (187 lb 2.7 oz)    Exam: General exam: In no acute distress. Respiratory system: Good air movement and clear to auscultation. Cardiovascular system: S1 & S2 heard, RRR. No JVD. Gastrointestinal system: Abdomen is nondistended, Soft with suprapubic tenderness. Extremities: No pedal edema. Skin: No rashes, lesions or ulcers Psychiatry: Judgement and insight appear normal. Mood & affect appropriate.    Data Reviewed:    Labs: Basic Metabolic Panel:  Recent Labs Lab 02/24/16 1523 02/25/16 0234  NA 138 140  K 4.6 4.6  CL 105 104  CO2 25 26  GLUCOSE 116* 110*  BUN 13 12  CREATININE 1.05* 1.01*  CALCIUM 9.4 9.3   GFR Estimated Creatinine Clearance: 55.4 mL/min (by C-G formula based on SCr of 1.01 mg/dL (H)). Liver Function Tests: No results for input(s): AST, ALT, ALKPHOS, BILITOT, PROT, ALBUMIN in the last 168 hours. No results for input(s): LIPASE, AMYLASE in the last 168 hours. No results for input(s): AMMONIA in the last 168 hours. Coagulation profile No results for input(s): INR, PROTIME in the last 168 hours.  CBC:  Recent Labs Lab 02/24/16 1523 02/25/16 0234  WBC 11.5* 8.6  HGB 13.2 13.4  HCT 40.5 41.3  MCV 90.6 89.4  PLT 197 186   Cardiac Enzymes: No results for input(s): CKTOTAL, CKMB, CKMBINDEX, TROPONINI in the last 168 hours. BNP (last 3 results) No results for input(s): PROBNP in the last 8760 hours. CBG:  Recent Labs Lab 02/24/16 1816  GLUCAP 120*   D-Dimer: No results for  input(s): DDIMER in the last 72 hours. Hgb A1c: No results for input(s): HGBA1C in the last 72 hours. Lipid Profile: No results for input(s): CHOL, HDL, LDLCALC, TRIG, CHOLHDL, LDLDIRECT in the last 72 hours. Thyroid function studies:  Recent Labs  02/25/16 0234  TSH 1.006   Anemia work up: No results for input(s): VITAMINB12, FOLATE, FERRITIN, TIBC, IRON, RETICCTPCT in the last 72 hours. Sepsis Labs:  Recent Labs Lab 02/24/16 1523 02/25/16 0234  WBC 11.5* 8.6   Microbiology Recent Results (from the past 240 hour(s))  Urine culture     Status: Abnormal   Collection Time: 02/24/16  3:29 PM  Result Value Ref Range Status   Specimen Description URINE, RANDOM  Final   Special Requests NONE  Final   Culture MULTIPLE SPECIES PRESENT, SUGGEST RECOLLECTION (A)  Final   Report Status 02/26/2016 FINAL  Final     Medications:   . apixaban  5 mg Oral BID  . cefTRIAXone (ROCEPHIN)  IV  1 g Intravenous Q24H  . diltiazem  240 mg Oral Daily  . Interferon Beta-1b  0.25 mg Subcutaneous QODAY  .  levothyroxine  125 mcg Oral QAC breakfast  . multivitamin with minerals  1 tablet Oral Daily  . oxybutynin  5 mg Oral QHS  . pantoprazole  40 mg Oral Daily  . rosuvastatin  10 mg Oral Daily   Continuous Infusions:   Time spent: 25 min   LOS: 2 days   Marinda ElkFELIZ ORTIZ, Makhi Muzquiz  [facility] Pager [phone]  *Please refer to [email or web address], password [email or web address] to get updated schedule on who will round on this patient, as hospitalists switch teams weekly. If 7PM-7AM, please contact night-coverage at [email or web address], password [email or web address] for any overnight needs.  02/26/2016, 10:11 AM

## 2016-02-26 NOTE — Progress Notes (Addendum)
Pharmacy Antibiotic Note  Jillian Fisher is a 70 y.o. female admitted on 02/24/2016 with UTI.  Pharmacy has been consulted for Ceftriaxone dosing. Afebrile and WBC decreased from 11.5 to 8.6.   Plan: -Continue Ceftriaxone 1g IV q24h -F/U urine cultures and clinical picture  -F/U duration of therapy  Pharmacy signing off for this consult as renal adjustment is not required for this antibiotic. Please re-consult if needed.   Microbiology: 11/21 urine cx: mult species, needs recollect   Height: 5\' 4"  (162.6 cm) Weight: 187 lb 2.7 oz (84.9 kg) IBW/kg (Calculated) : 54.7  Temp (24hrs), Avg:98.2 F (36.8 C), Min:97.7 F (36.5 C), Max:98.5 F (36.9 C)   Recent Labs Lab 02/24/16 1523 02/25/16 0234  WBC 11.5* 8.6  CREATININE 1.05* 1.01*    Estimated Creatinine Clearance: 55.4 mL/min (by C-G formula based on SCr of 1.01 mg/dL (H)).    Allergies  Allergen Reactions  . Sulfa Antibiotics Rash    York Cerise, PharmD Pharmacy Resident  Pager [phone] 02/26/16 10:13 AM

## 2016-02-27 DIAGNOSIS — I480 Paroxysmal atrial fibrillation: Secondary | ICD-10-CM

## 2016-02-27 MED ORDER — CIPROFLOXACIN HCL 500 MG PO TABS: 500.0000 mg | ORAL_TABLET | Freq: Two times a day (BID) | ORAL | 0 refills | Status: DC

## 2016-02-27 MED ORDER — CARVEDILOL 6.25 MG PO TABS: 6.2500 mg | ORAL_TABLET | Freq: Two times a day (BID) | ORAL | 0 refills | Status: DC

## 2016-02-27 MED ORDER — CARVEDILOL 6.25 MG PO TABS
6.2500 mg | ORAL_TABLET | Freq: Two times a day (BID) | ORAL | Status: DC
  Administered 2016-02-27: 6.25 mg via ORAL
  Filled 2016-02-27: qty 1

## 2016-02-27 MED ORDER — MORPHINE SULFATE (PF) 2 MG/ML IV SOLN
2.0000 mg | Freq: Once | INTRAVENOUS | Status: AC
  Administered 2016-02-27: 2 mg via INTRAVENOUS
  Filled 2016-02-27: qty 1

## 2016-02-27 NOTE — Discharge Summary (Signed)
PATIENT DETAILS Name: Jillian Fisher Age: 70 y.o. Sex: female Date of Birth: 11-18-1945 MRN: [id number]. Admitting Physician: Geradine Girt, DO POE:UMPN Alroy Dust, MD  Admit Date: 02/24/2016 Discharge date: 02/27/2016  Recommendations for Outpatient Follow-up:  1. Follow up with PCP in 1-2 weeks 2. Please obtain BMP/CBC in one week   Admitted From:  Home  Disposition: [place]: No  Equipment/Devices: None  Discharge Condition: Stable  CODE STATUS: FULL CODE  Diet recommendation:  Heart Healthy  Brief Summary: See H&P, Labs, Consult and Test reports for all details in brief, Jillian Fisher is an 70 y.o. female  past medical history of multiple sclerosis, newly diagnosed atrial fibrillation on apixiban the comes in complaining of lower right lower extremity weakness since the morning of admission, an MRI of the brain was done that showed no acute findings. Patient was seen by urology, and thought to have weakness due to a urinary tract infection and not thought to have a multiple sclerosis flare. Patient was subsequently admitted for further evaluation and treatment. See below for further details  Brief Hospital Course: Worsening right-sided weakness: Felt to be probably from deconditioning due to UTI rather than a multiple sclerosis flare. MRI brain on admission did not show any acute abnormalities. Neurology was consulted, recommendations were to continue treatment with IV antibiotics and follow clinical course and to only pursue MRI of her C-spine if she did not improve. Fortunately, with IV antibiotics and other supportive measures, patient is back to her usual baseline. Neurology does not advise any further workup, current working diagnoses is that patient had worsening right leg weakness due to deconditioning from UTI rather than MS flare. Patient was seen by physical therapy services, and not felt to require any further home with services on  discharge.  UTI: Patient was initially started on Rocephin-urine culture result shows multiple bacterial morphotypes-she was subsequently transitioned to ciprofloxacin-since she has been on antibiotics for the past number of days, it is felt that repeating urine culture at this point would be of low yield. Plans are to complete a few more days of empiric ciprofloxacin. By day of discharge weakness had significantly improved, she is afebrile and nontoxic looking.  Multiple sclerosis: Continue her usual medications and follow up with her primary care M.D. and primary neurologist.  Hypertension: Uncontrolled, have added low-dose Coreg, continue Cardizem. Further optimization deferred to the outpatient setting  History of paroxysmal atrial fibrillation: Continue rate control with Cardizem, and anticoagulation with Eliquis  Procedures/Studies: None  Discharge Diagnoses:  Principal Problem:   Right leg weakness Active Problems:   Multiple sclerosis (HCC)   Gait instability   Lower urinary tract infectious disease   PAF (paroxysmal atrial fibrillation) (HCC)   Weakness of right lower extremity   Discharge Instructions:  Activity:  As tolerated with Full fall precautions use walker/cane & assistance as needed  Discharge Instructions    Call MD for:  extreme fatigue    Complete by:  As directed    Call MD for:  persistant dizziness or light-headedness    Complete by:  As directed    Diet - low sodium heart healthy    Complete by:  As directed    Discharge instructions    Complete by:  As directed    Follow with Primary MD  Donnie Coffin, MD  In 1 week  Please get a complete blood count and chemistry panel checked by your Primary MD at your next visit, and again  as instructed by your Primary MD.  Get Medicines reviewed and adjusted: Please take all your medications with you for your next visit with your Primary MD  Laboratory/radiological data: Please request your Primary MD to go  over all hospital tests and procedure/radiological results at the follow up, please ask your Primary MD to get all Hospital records sent to his/her office.  In some cases, they will be blood work, cultures and biopsy results pending at the time of your discharge. Please request that your primary care M.D. follows up on these results.  Also Note the following: If you experience worsening of your admission symptoms, develop shortness of breath, life threatening emergency, suicidal or homicidal thoughts you must seek medical attention immediately by calling 911 or calling your MD immediately  if symptoms less severe.  You must read complete instructions/literature along with all the possible adverse reactions/side effects for all the Medicines you take and that have been prescribed to you. Take any new Medicines after you have completely understood and accpet all the possible adverse reactions/side effects.   Do not drive when taking Pain medications or sleeping medications (Benzodaizepines)  Do not take more than prescribed Pain, Sleep and Anxiety Medications. It is not advisable to combine anxiety,sleep and pain medications without talking with your primary care practitioner  Special Instructions: If you have smoked or chewed Tobacco  in the last 2 yrs please stop smoking, stop any regular Alcohol  and or any Recreational drug use.  Wear Seat belts while driving.  Please note: You were cared for by a hospitalist during your hospital stay. Once you are discharged, your primary care physician will handle any further medical issues. Please note that NO REFILLS for any discharge medications will be authorized once you are discharged, as it is imperative that you return to your primary care physician (or establish a relationship with a primary care physician if you do not have one) for your post hospital discharge needs so that they can reassess your need for medications and monitor your lab values.    Increase activity slowly    Complete by:  As directed        Medication List    TAKE these medications   acetaminophen 500 MG tablet Commonly known as:  TYLENOL Take 1,000 mg by mouth every 6 (six) hours as needed. For pain   ALPRAZolam 0.25 MG tablet Commonly known as:  XANAX Take 0.25 mg by mouth daily as needed for anxiety.   apixaban 5 MG Tabs tablet Commonly known as:  ELIQUIS Take 1 tablet (5 mg total) by mouth 2 (two) times daily.   carvedilol 6.25 MG tablet Commonly known as:  COREG Take 1 tablet (6.25 mg total) by mouth 2 (two) times daily with a meal.   ciprofloxacin 500 MG tablet Commonly known as:  CIPRO Take 1 tablet (500 mg total) by mouth 2 (two) times daily.   diltiazem 240 MG 24 hr capsule Commonly known as:  CARDIZEM CD Take 1 capsule (240 mg total) by mouth daily.   estropipate 0.75 MG tablet Commonly known as:  OGEN TAKE 1 TABLET DAILY FOR THREE WEEKS, 1 WEEK OFF ORALLY   Interferon Beta-1b 0.3 MG Kit injection Commonly known as:  BETASERON Inject 0.25 mg into the skin every other day.   levothyroxine 125 MCG tablet Commonly known as:  SYNTHROID, LEVOTHROID Take 125 mcg by mouth daily.   multivitamin with minerals Tabs tablet Take 1 tablet by mouth daily.   omeprazole 20  MG capsule Commonly known as:  PRILOSEC Take 20 mg by mouth daily.   oxybutynin 5 MG tablet Commonly known as:  DITROPAN Take 5 mg by mouth at bedtime.   rosuvastatin 20 MG tablet Commonly known as:  CRESTOR Take 10 mg by mouth daily.      Follow-up Information    Follow up Follow up.   Contact information: Call your neurologist in the morning.       Donnie Coffin, MD. Schedule an appointment as soon as possible for a visit in 1 week(s).   Specialty:  Family Medicine Contact information: [address] [place] [address] [phone]          Allergies  Allergen Reactions  . Sulfa Antibiotics Rash   Consultations:    neurology   Other Procedures/Studies: Dg Chest 2 View  Result Date: 02/02/2016 CLINICAL DATA:  Atrial fibrillation EXAM: CHEST  2 VIEW COMPARISON:  None. FINDINGS: Borderline cardiomegaly. No acute infiltrate or pleural effusion. No pulmonary edema. Mild degenerative changes thoracic spine. IMPRESSION: Borderline cardiomegaly.  No active disease. Electronically Signed   By: Lahoma Crocker M.D.   On: 02/02/2016 12:23   Mr Jeri Cos And Wo Contrast  Result Date: 02/24/2016 CLINICAL DATA:  New onset RIGHT lower extremity weakness today. Recent diagnosis of atrial fibrillation, on Eliquis. History of multiple sclerosis, last exacerbation was 3 years ago. History of hypertension and hypercholesterolemia. EXAM: MRI HEAD WITHOUT AND WITH CONTRAST TECHNIQUE: Multiplanar, multiecho pulse sequences of the brain and surrounding structures were obtained without and with intravenous contrast. CONTRAST:  43m MULTIHANCE GADOBENATE DIMEGLUMINE 529 MG/ML IV SOLN COMPARISON:  MRI of the brain May 28, 2014 FINDINGS: INTRACRANIAL CONTENTS: No reduced diffusion to suggest acute ischemia or hyperacute demyelination. Faint T2 shine through LEFT posterior frontal lobe. No susceptibility artifact to suggest acute hemorrhage. Chronic LEFT LEFT basal ganglia microhemorrhage. The ventricles and sulci are normal for patient's age. Confluent supratentorial white matter FLAIR T2 hyperintensities, many of which radiate from the periventricular margin with low T1 signal compatible with black holes of demyelination, unchanged. Subtle T2 bright signal abnormality in the basal ganglia and LEFT thalamus is unchanged. Scattered frontoparietal cortical lesions. No abnormal intraparenchymal or extra-axial enhancement. No abnormal extra-axial fluid collections. No extra-axial masses. VASCULAR: Normal major intracranial vascular flow voids present at skull base. SKULL AND UPPER CERVICAL SPINE: No abnormal sellar expansion. No suspicious  calvarial bone marrow signal. Craniocervical junction maintained. SINUSES/ORBITS: The mastoid air-cells and included paranasal sinuses are well-aerated.The included ocular globes and orbital contents are non-suspicious. OTHER: None. IMPRESSION: No acute intracranial process. Severe chronic demyelination, stable from prior examination without enhancement to suggest acute demyelination. No parenchymal brain volume loss for age. Electronically Signed   By: CElon AlasM.D.   On: 02/24/2016 21:17      TODAY-DAY OF DISCHARGE:  Subjective:   MBevelyn Ngotoday has no headache,no chest abdominal pain,no new weakness tingling or numbness, feels much better wants to go home today.   Objective:   Blood pressure 130/74, pulse 86, temperature 98.1 F (36.7 C), temperature source Oral, resp. rate 20, height _0  (1.626 m), weight 84.9 kg (187 lb 2.7 oz), SpO2 (!) 76 %.  Intake/Output Summary (Last 24 hours) at 02/27/16 1115 Last data filed at 02/26/16 1843  Gross per 24 hour  Intake              440 ml  Output  0 ml  Net              440 ml   Filed Weights   02/25/16 0058  Weight: 84.9 kg (187 lb 2.7 oz)    Exam: Awake Alert, Oriented *3, No new F.N deficits, Normal affect Jerome.AT,PERRAL Supple Neck,No JVD, No cervical lymphadenopathy appriciated.  Symmetrical Chest wall movement, Good air movement bilaterally, CTAB RRR,No Gallops,Rubs or new Murmurs, No Parasternal Heave +ve B.Sounds, Abd Soft, Non tender, No organomegaly appriciated, No rebound -guarding or rigidity. No Cyanosis, Clubbing or edema, No new Rash or bruise   PERTINENT RADIOLOGIC STUDIES: Dg Chest 2 View  Result Date: 02/02/2016 CLINICAL DATA:  Atrial fibrillation EXAM: CHEST  2 VIEW COMPARISON:  None. FINDINGS: Borderline cardiomegaly. No acute infiltrate or pleural effusion. No pulmonary edema. Mild degenerative changes thoracic spine. IMPRESSION: Borderline cardiomegaly.  No active disease.  Electronically Signed   By: Lahoma Crocker M.D.   On: 02/02/2016 12:23   Mr Jeri Cos And Wo Contrast  Result Date: 02/24/2016 CLINICAL DATA:  New onset RIGHT lower extremity weakness today. Recent diagnosis of atrial fibrillation, on Eliquis. History of multiple sclerosis, last exacerbation was 3 years ago. History of hypertension and hypercholesterolemia. EXAM: MRI HEAD WITHOUT AND WITH CONTRAST TECHNIQUE: Multiplanar, multiecho pulse sequences of the brain and surrounding structures were obtained without and with intravenous contrast. CONTRAST:  37m MULTIHANCE GADOBENATE DIMEGLUMINE 529 MG/ML IV SOLN COMPARISON:  MRI of the brain May 28, 2014 FINDINGS: INTRACRANIAL CONTENTS: No reduced diffusion to suggest acute ischemia or hyperacute demyelination. Faint T2 shine through LEFT posterior frontal lobe. No susceptibility artifact to suggest acute hemorrhage. Chronic LEFT LEFT basal ganglia microhemorrhage. The ventricles and sulci are normal for patient's age. Confluent supratentorial white matter FLAIR T2 hyperintensities, many of which radiate from the periventricular margin with low T1 signal compatible with black holes of demyelination, unchanged. Subtle T2 bright signal abnormality in the basal ganglia and LEFT thalamus is unchanged. Scattered frontoparietal cortical lesions. No abnormal intraparenchymal or extra-axial enhancement. No abnormal extra-axial fluid collections. No extra-axial masses. VASCULAR: Normal major intracranial vascular flow voids present at skull base. SKULL AND UPPER CERVICAL SPINE: No abnormal sellar expansion. No suspicious calvarial bone marrow signal. Craniocervical junction maintained. SINUSES/ORBITS: The mastoid air-cells and included paranasal sinuses are well-aerated.The included ocular globes and orbital contents are non-suspicious. OTHER: None. IMPRESSION: No acute intracranial process. Severe chronic demyelination, stable from prior examination without enhancement to  suggest acute demyelination. No parenchymal brain volume loss for age. Electronically Signed   By: CElon AlasM.D.   On: 02/24/2016 21:17     PERTINENT LAB RESULTS: CBC:  Recent Labs  02/24/16 1523 02/25/16 0234  WBC 11.5* 8.6  HGB 13.2 13.4  HCT 40.5 41.3  PLT 197 186   CMET CMP     Component Value Date/Time   NA 140 02/25/2016 0234   NA 139 01/09/2015 0915   K 4.6 02/25/2016 0234   CL 104 02/25/2016 0234   CO2 26 02/25/2016 0234   GLUCOSE 110 (H) 02/25/2016 0234   BUN 12 02/25/2016 0234   BUN 15 01/09/2015 0915   CREATININE 1.01 (H) 02/25/2016 0234   CALCIUM 9.3 02/25/2016 0234   PROT 7.9 01/09/2015 0915   ALBUMIN 4.0 01/09/2015 0915   AST 21 01/09/2015 0915   ALT 13 01/09/2015 0915   ALKPHOS 61 01/09/2015 0915   BILITOT 0.3 01/09/2015 0915   GFRNONAA 55 (L) 02/25/2016 0234   GFRAA >60 02/25/2016 0234    GFR  Estimated Creatinine Clearance: 55.4 mL/min (by C-G formula based on SCr of 1.01 mg/dL (H)). No results for input(s): LIPASE, AMYLASE in the last 72 hours. No results for input(s): CKTOTAL, CKMB, CKMBINDEX, TROPONINI in the last 72 hours. Invalid input(s): POCBNP No results for input(s): DDIMER in the last 72 hours. No results for input(s): HGBA1C in the last 72 hours. No results for input(s): CHOL, HDL, LDLCALC, TRIG, CHOLHDL, LDLDIRECT in the last 72 hours.  Recent Labs  02/25/16 0234  TSH 1.006   No results for input(s): VITAMINB12, FOLATE, FERRITIN, TIBC, IRON, RETICCTPCT in the last 72 hours. Coags: No results for input(s): INR in the last 72 hours.  Invalid input(s): PT Microbiology: Recent Results (from the past 240 hour(s))  Urine culture     Status: Abnormal   Collection Time: 02/24/16  3:29 PM  Result Value Ref Range Status   Specimen Description URINE, RANDOM  Final   Special Requests NONE  Final   Culture MULTIPLE SPECIES PRESENT, SUGGEST RECOLLECTION (A)  Final   Report Status 02/26/2016 FINAL  Final    FURTHER DISCHARGE  INSTRUCTIONS:  Get Medicines reviewed and adjusted: Please take all your medications with you for your next visit with your Primary MD  Laboratory/radiological data: Please request your Primary MD to go over all hospital tests and procedure/radiological results at the follow up, please ask your Primary MD to get all Hospital records sent to his/her office.  In some cases, they will be blood work, cultures and biopsy results pending at the time of your discharge. Please request that your primary care M.D. goes through all the records of your hospital data and follows up on these results.  Also Note the following: If you experience worsening of your admission symptoms, develop shortness of breath, life threatening emergency, suicidal or homicidal thoughts you must seek medical attention immediately by calling 911 or calling your MD immediately  if symptoms less severe.  You must read complete instructions/literature along with all the possible adverse reactions/side effects for all the Medicines you take and that have been prescribed to you. Take any new Medicines after you have completely understood and accpet all the possible adverse reactions/side effects.   Do not drive when taking Pain medications or sleeping medications (Benzodaizepines)  Do not take more than prescribed Pain, Sleep and Anxiety Medications. It is not advisable to combine anxiety,sleep and pain medications without talking with your primary care practitioner  Special Instructions: If you have smoked or chewed Tobacco  in the last 2 yrs please stop smoking, stop any regular Alcohol  and or any Recreational drug use.  Wear Seat belts while driving.  Please note: You were cared for by a hospitalist during your hospital stay. Once you are discharged, your primary care physician will handle any further medical issues. Please note that NO REFILLS for any discharge medications will be authorized once you are discharged, as it is  imperative that you return to your primary care physician (or establish a relationship with a primary care physician if you do not have one) for your post hospital discharge needs so that they can reassess your need for medications and monitor your lab values.  Total Time spent coordinating discharge including counseling, education and face to face time equals  45 minutes.  SignedOren Binet 02/27/2016 11:15 AM

## 2016-02-27 NOTE — Progress Notes (Signed)
Paged Kirby,NP twice about patient complaining of severe generalized pain with elevated BP. Last BP 186/66 HR 70's. Kirby,NP returned page by placing a one time order for 2mg  of morphine. Will continue to monitor and treat per MD orders.

## 2016-02-27 NOTE — Care Management Note (Signed)
Case Management Note  Patient Details  Name: Jillian DeisMalinda M Fisher MRN: [id number] Date of Birth: Aug 25, 1945  Subjective/Objective:            Patient from home with spouse, admitted with UTI, and weakness, PT rec no follow. Patient has walker PTA. No CM needs identified at this time.         Action/Plan:  DC to home self care.  Expected Discharge Date:                  Expected Discharge Plan:  Home/Self Care  In-House Referral:  NA  Discharge planning Services  CM Consult  Post Acute Care Choice:    Choice offered to:     DME Arranged:    DME Agency:     HH Arranged:    HH Agency:     Status of Service:  Completed, signed off  If discussed at [employer] Length of Stay Meetings, dates discussed:    Additional Comments:  Lawerance SabalDebbie Solveig Fangman, RN 02/27/2016, 8:30 AM

## 2016-03-01 DIAGNOSIS — I10 Essential (primary) hypertension: Secondary | ICD-10-CM | POA: Diagnosis not present

## 2016-03-01 DIAGNOSIS — I351 Nonrheumatic aortic (valve) insufficiency: Secondary | ICD-10-CM | POA: Diagnosis not present

## 2016-03-01 DIAGNOSIS — E7800 Pure hypercholesterolemia, unspecified: Secondary | ICD-10-CM | POA: Diagnosis not present

## 2016-03-01 DIAGNOSIS — I480 Paroxysmal atrial fibrillation: Secondary | ICD-10-CM | POA: Diagnosis not present

## 2016-03-01 NOTE — Consult Note (Signed)
            [facility]  03/01/2016  Jillian DeisMalinda M Fisher October 17, 1945 [id number]    Wentto see patient to identify possible discharge needs butpatient was already discharged.  Primary care provider's office contacted(Bonnie)to notify of patient's discharge and need for post hospital follow-up and transition of care. Made aware to refer [facility] for care coordination needs if deemed appropriate.  For additional questions please contact:  Karin GoldenLorraine A. Undine Nealis, BSN, RN-BC [facility] PRIMARY CARE Navigator Cell: [phone]

## 2016-04-07 ENCOUNTER — Ambulatory Visit (INDEPENDENT_AMBULATORY_CARE_PROVIDER_SITE_OTHER): Payer: Medicare HMO | Admitting: Adult Health

## 2016-04-07 ENCOUNTER — Encounter: Payer: Self-pay | Admitting: Adult Health

## 2016-04-07 VITALS — BP 185/66 | HR 63 | Ht 64.0 in | Wt 193.4 lb

## 2016-04-07 DIAGNOSIS — G35 Multiple sclerosis: Secondary | ICD-10-CM | POA: Diagnosis not present

## 2016-04-07 NOTE — Patient Instructions (Signed)
Continue Betaseron  If your symptoms worsen or you develop new symptoms please let us know.   

## 2016-04-07 NOTE — Progress Notes (Signed)
I have read the note, and I agree with the clinical assessment and plan.  Alayza Pieper KEITH   

## 2016-04-07 NOTE — Progress Notes (Signed)
PATIENT: Jillian Fisher DOB: Aug 19, 1945  REASON FOR VISIT: follow up- multiple sclerosis HISTORY FROM: patient  HISTORY OF PRESENT ILLNESS: Today 04/07/2016: Jillian Fisher is a 71 year old female with a history of multiple sclerosis. She returns today for follow-up. At the last visit she saw Cecille Rubin and presented with right lower extremity weakness. She had recently been diagnosed with atrial fibrillation. She was sent to the emergency room to rule out stroke. Her MRI was negative for stroke and no acute lesions.. However she did have a UTI. She was treated for her UTI and did physical therapy while in the hospital. Since then she states that she's been doing well. She uses a Rollator when ambulating. Reports that her weakness has improved. Denies any changes with the bowels or bladder. Denies any changes with vision. Denies any falls. She returns today for an evaluation.  HISTORY 11/21/2017CM Jillian Fisher, 71 year old female returns for follow-up. She has a history of multiple sclerosis and is currently on Betaseron. She called today to be seen because of acute onset right lower extremity weakness. She went to bed last night and was fine, ambulated normally yesterday. Woke up this morning and unable to take any steps due to increased weakness. She was seen in the emergency room on October 30 for new onset atrial fibrillation and was placed on Eliquis. She has been seen by Dr. Nadyne Coombes for 2-D echo ,stress test and ultrasound of the heart however I do not have access to those results. Her last exacerbation of MS was over 3 years ago. She has had no new numbness, visual changes speech changes increased confusion. She returns for reevaluation  09/30/15 Jillian Fisher is a 71 year old right-handed black female with a history of multiple sclerosis on Betaseron. The patient is tolerating the medication quite well. She has not had any change in her clinical condition since last seen. She has had MRI  evaluation of the brain done in February 2016, this showed no change from 2014. The patient reports no new numbness, vision changes, weakness, or changes in bowel or bladder function. She has a chronic gait disorder, she uses a cane for ambulation. 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 She denies any new medical issues that have come up since last seen.  REVIEW OF SYSTEMS: Out of a complete 14 system review of symptoms, the patient complains only of the following symptoms, and all other reviewed systems are negative.  See history of present illness  ALLERGIES: Allergies  Allergen Reactions  . Sulfa Antibiotics Rash    HOME MEDICATIONS: Outpatient Medications Prior to Visit  Medication Sig Dispense Refill  . acetaminophen (TYLENOL) 500 MG tablet Take 1,000 mg by mouth every 6 (six) hours as needed. For pain    . ALPRAZolam (XANAX) 0.25 MG tablet Take 0.25 mg by mouth daily as needed for anxiety.    Marland Kitchen diltiazem (CARDIZEM CD) 240 MG 24 hr capsule Take 1 capsule (240 mg total) by mouth daily. 30 capsule 0  . estropipate (OGEN) 0.75 MG tablet TAKE 1 TABLET DAILY FOR THREE WEEKS, 1 WEEK OFF ORALLY  12  . Interferon Beta-1b (BETASERON) 0.3 MG KIT injection Inject 0.25 mg into the skin every other day. 1 kit 11  . levothyroxine (SYNTHROID, LEVOTHROID) 125 MCG tablet Take 125 mcg by mouth daily.  3  . Multiple Vitamin (MULTIVITAMIN WITH MINERALS) TABS Take 1 tablet by mouth daily.    Marland Kitchen omeprazole (PRILOSEC) 20 MG  capsule Take 20 mg by mouth daily.    Marland Kitchen oxybutynin (DITROPAN) 5 MG tablet Take 5 mg by mouth at bedtime.     . rosuvastatin (CRESTOR) 20 MG tablet Take 10 mg by mouth daily.     Marland Kitchen apixaban (ELIQUIS) 5 MG TABS tablet Take 1 tablet (5 mg total) by mouth 2 (two) times daily. 60 tablet 0  . carvedilol (COREG) 6.25 MG tablet Take 1 tablet (6.25 mg total) by mouth 2 (two) times daily with a meal. 60 tablet 0  . ciprofloxacin (CIPRO) 500 MG tablet Take 1  tablet (500 mg total) by mouth 2 (two) times daily. 6 tablet 0   No facility-administered medications prior to visit.     PAST MEDICAL HISTORY: Past Medical History:  Diagnosis Date  . Abnormality of gait 01/25/2013  . Arthritis   . GERD (gastroesophageal reflux disease)   . Hypercholesteremia   . Hypertension   . Hypothyroidism   . Multiple sclerosis ([place])   . Neurogenic bladder 01/25/2013    PAST SURGICAL HISTORY: Past Surgical History:  Procedure Laterality Date  . ABDOMINAL HYSTERECTOMY      FAMILY HISTORY: Family History  Problem Relation Age of Onset  . Heart attack Mother   . Multiple sclerosis Sister   . Heart disease Sister   . Diabetes Other     SOCIAL HISTORY: Social History   Social History  . Marital status: Married    Spouse name: Jillian Fisher  . Number of children: 2  . Years of education: 8 th   Occupational History  . retired    Social History Main Topics  . Smoking status: Never Smoker  . Smokeless tobacco: Never Used     Comment: quit 30-40 years ago (documented in 2013)  . Alcohol use No  . Drug use: No  . Sexual activity: Not on file   Other Topics Concern  . Not on file   Social History Narrative   Patient lives at home with her husband.   Retired.   Education 8 th grade   Right handed.   Caffeine none.      PHYSICAL EXAM  Vitals:   04/07/16 1434  BP: (!) 185/66  Pulse: 63  Weight: 193 lb 6.4 oz (87.7 kg)  Height: _0  (1.626 m)   Body mass index is 33.2 kg/m.  Generalized: Well developed, in no acute distress   Neurological examination  Mentation: Alert oriented to time, place, history taking. Follows all commands speech and language fluent Cranial nerve II-XII: Pupils were equal round reactive to light. Extraocular movements were full, visual field were full on confrontational test. Facial sensation and strength were normal. Uvula tongue midline. Head turning and shoulder shrug  were normal and symmetric. Motor: The  motor testing reveals 5 over 5 strength of all 4 extremities. Good symmetric motor tone is noted throughout.  Sensory: Sensory testing is intact to soft touch on all 4 extremities. No evidence of extinction is noted.  Coordination: Cerebellar testing reveals good finger-nose-finger and heel-to-shin bilaterally.  Gait and station: Patient uses a Rollator to ambulate. Tandem gait not attempted. Reflexes: Deep tendon reflexes are symmetric and normal bilaterally.   DIAGNOSTIC DATA (LABS, IMAGING, TESTING) - I reviewed patient records, labs, notes, testing and imaging myself where available.  Lab Results  Component Value Date   WBC 8.6 02/25/2016   HGB 13.4 02/25/2016   HCT 41.3 02/25/2016   MCV 89.4 02/25/2016   PLT 186 02/25/2016  Component Value Date/Time   NA 140 02/25/2016 0234   NA 139 01/09/2015 0915   K 4.6 02/25/2016 0234   CL 104 02/25/2016 0234   CO2 26 02/25/2016 0234   GLUCOSE 110 (H) 02/25/2016 0234   BUN 12 02/25/2016 0234   BUN 15 01/09/2015 0915   CREATININE 1.01 (H) 02/25/2016 0234   CALCIUM 9.3 02/25/2016 0234   PROT 7.9 01/09/2015 0915   ALBUMIN 4.0 01/09/2015 0915   AST 21 01/09/2015 0915   ALT 13 01/09/2015 0915   ALKPHOS 61 01/09/2015 0915   BILITOT 0.3 01/09/2015 0915   GFRNONAA 55 (L) 02/25/2016 0234   GFRAA >60 02/25/2016 0234       ASSESSMENT AND PLAN 71 y.o. year old female  has a past medical history of Abnormality of gait (01/25/2013); Arthritis; GERD (gastroesophageal reflux disease); Hypercholesteremia; Hypertension; Hypothyroidism; Multiple sclerosis ([place]); and Neurogenic bladder (01/25/2013). here with:  1. Multiple sclerosis  Overall the patient is doing well since her last visit. She will continue on Betaseron. She had blood work in the Hospital that was relatively unremarkable. She also had an MRI that showed no active lesions nor infarct. Patient advised that if her symptoms worsen or she develops any new symptoms she should let us  know. She will follow-up in 6 months with Dr. Jannifer Franklin.    Ward Givens, MSN, NP-C 04/07/2016, 2:43 PM [facility] [address] [place], [place] [address] [phone]

## 2016-04-12 DIAGNOSIS — N390 Urinary tract infection, site not specified: Secondary | ICD-10-CM | POA: Diagnosis not present

## 2016-04-16 ENCOUNTER — Telehealth: Payer: Self-pay | Admitting: Neurology

## 2016-04-16 NOTE — Telephone Encounter (Signed)
I have received blood work results from the primary care physician. BUN is 16, creatinine of 1.07, GFR is 61. Sodium 141, potassium 4.6, chloride of 105, CO2 31, [place] 9.7. White blood count is 5.0, hemoglobin 12.1, hematocrit 37.0, platelets of 216.  The patient takes Betaseron for multiple sclerosis. The liver enzymes were not included.Jillian Fisher

## 2016-04-26 DIAGNOSIS — N390 Urinary tract infection, site not specified: Secondary | ICD-10-CM | POA: Diagnosis not present

## 2016-05-24 ENCOUNTER — Encounter (HOSPITAL_COMMUNITY): Payer: Self-pay

## 2016-05-24 ENCOUNTER — Emergency Department (HOSPITAL_COMMUNITY)
Admission: EM | Admit: 2016-05-24 | Discharge: 2016-05-24 | Disposition: A | Discharge status: Home / Self Care | Payer: Medicare HMO | Attending: Emergency Medicine | Admitting: Emergency Medicine

## 2016-05-24 DIAGNOSIS — K140 Glossitis: Secondary | ICD-10-CM | POA: Diagnosis not present

## 2016-05-24 DIAGNOSIS — Z7901 Long term (current) use of anticoagulants: Secondary | ICD-10-CM | POA: Insufficient documentation

## 2016-05-24 DIAGNOSIS — I10 Essential (primary) hypertension: Secondary | ICD-10-CM | POA: Insufficient documentation

## 2016-05-24 DIAGNOSIS — E039 Hypothyroidism, unspecified: Secondary | ICD-10-CM | POA: Insufficient documentation

## 2016-05-24 DIAGNOSIS — R6884 Jaw pain: Secondary | ICD-10-CM | POA: Diagnosis present

## 2016-05-24 HISTORY — DX: Unspecified atrial fibrillation: I48.91

## 2016-05-24 MED ORDER — HYDROCODONE-ACETAMINOPHEN 5-325 MG PO TABS: 1.0000 | ORAL_TABLET | Freq: Four times a day (QID) | ORAL | 0 refills | Status: DC | PRN

## 2016-05-24 MED ORDER — HYDROCODONE-ACETAMINOPHEN 5-325 MG PO TABS
1.0000 | ORAL_TABLET | Freq: Once | ORAL | Status: AC
  Administered 2016-05-24: 1 via ORAL
  Filled 2016-05-24: qty 1

## 2016-05-24 NOTE — Discharge Instructions (Signed)
If you develop fever, facial swelling, trouble breathing or swallowing return immediately

## 2016-05-24 NOTE — ED Provider Notes (Signed)
[facility] DEPT Provider Note   CSN: [id number] Arrival date & time: 05/24/16  9983     History   Chief Complaint Chief Complaint  Patient presents with  . Jaw Pain    HPI Jillian Fisher is a 71 y.o. female.  Pt with hx of MS, a.fib on eliquis, htn who presents today with right lower jaw pain and tongue pain that started at 2am.  She denies any SOB, trouble swallowing or chest pain.  No facial swelling or fever.  No dental pain.  She has not had anything like this before and no recent dental procedures or medication changes.  Pain is sharp and 10/10.  She says it is even hard to talk because her tongue hurts   The history is provided by the patient.    Past Medical History:  Diagnosis Date  . A-fib ([place])   . Abnormality of gait 01/25/2013  . Arthritis   . GERD (gastroesophageal reflux disease)   . Hypercholesteremia   . Hypertension   . Hypothyroidism   . Multiple sclerosis ([place])   . Neurogenic bladder 01/25/2013    Patient Active Problem List   Diagnosis Date Noted  . Right leg weakness 02/25/2016  . Lower urinary tract infectious disease 02/25/2016  . PAF (paroxysmal atrial fibrillation) ([place]) 02/25/2016  . Weakness of right lower extremity 02/25/2016  . Gait instability 02/24/2016  . Multiple sclerosis ([place]) 01/25/2013  . Abnormality of gait 01/25/2013  . Neurogenic bladder 01/25/2013    Past Surgical History:  Procedure Laterality Date  . ABDOMINAL HYSTERECTOMY      OB History    No data available       Home Medications    Prior to Admission medications   Medication Sig Start Date End Date Taking? Authorizing Provider  acetaminophen (TYLENOL) 500 MG tablet Take 1,000 mg by mouth every 6 (six) hours as needed. For pain    Historical Provider, MD  ALPRAZolam (XANAX) 0.25 MG tablet Take 0.25 mg by mouth daily as needed for anxiety. 12/25/15   Historical Provider, MD  apixaban (ELIQUIS) 5 MG TABS tablet Take 1 tablet (5 mg total) by mouth 2  (two) times daily. 02/02/16 03/03/16  Margette Fast, MD  diltiazem (CARDIZEM CD) 240 MG 24 hr capsule Take 1 capsule (240 mg total) by mouth daily. 02/02/16   Margette Fast, MD  estropipate (OGEN) 0.75 MG tablet TAKE 1 TABLET DAILY FOR THREE WEEKS, 1 WEEK OFF ORALLY 06/29/15   Historical Provider, MD  Interferon Beta-1b (BETASERON) 0.3 MG KIT injection Inject 0.25 mg into the skin every other day. 09/05/15   Kathrynn Ducking, MD  levothyroxine (SYNTHROID, LEVOTHROID) 125 MCG tablet Take 125 mcg by mouth daily. 12/11/14   Historical Provider, MD  Multiple Vitamin (MULTIVITAMIN WITH MINERALS) TABS Take 1 tablet by mouth daily.    Historical Provider, MD  omeprazole (PRILOSEC) 20 MG capsule Take 20 mg by mouth daily.    Historical Provider, MD  oxybutynin (DITROPAN) 5 MG tablet Take 5 mg by mouth at bedtime.     Historical Provider, MD  rosuvastatin (CRESTOR) 20 MG tablet Take 10 mg by mouth daily.     Historical Provider, MD    Family History Family History  Problem Relation Age of Onset  . Heart attack Mother   . Multiple sclerosis Sister   . Heart disease Sister   . Diabetes Other     Social History Social History  Substance Use Topics  . Smoking status: Never  Smoker  . Smokeless tobacco: Never Used     Comment: quit 30-40 years ago (documented in 2013)  . Alcohol use No     Allergies   Sulfa antibiotics   Review of Systems Review of Systems  All other systems reviewed and are negative.    Physical Exam Updated Vital Signs BP 186/83 (BP Location: Right Arm)   Pulse 88   Temp 98 F (36.7 C) (Oral)   Resp 18   SpO2 100%   Physical Exam  Constitutional: She is oriented to person, place, and time. She appears well-developed and well-nourished. No distress.  HENT:  Head: Normocephalic and atraumatic.  Mouth/Throat: Oropharynx is clear and moist and mucous membranes are normal. No oropharyngeal exudate, posterior oropharyngeal edema or posterior oropharyngeal erythema.     No facial swelling  Eyes: Conjunctivae and EOM are normal. Pupils are equal, round, and reactive to light.  Neck: Normal range of motion. Neck supple.  No tender parotid or submandibular glands  Cardiovascular: Normal rate and intact distal pulses.  An irregularly irregular rhythm present.  No murmur heard. Pulmonary/Chest: Effort normal.  Musculoskeletal: Normal range of motion. She exhibits no edema or tenderness.  Lymphadenopathy:    She has no cervical adenopathy.  Neurological: She is alert and oriented to person, place, and time.  Skin: Skin is warm and dry. No rash noted. No erythema.  Psychiatric: She has a normal mood and affect. Her behavior is normal.  Nursing note and vitals reviewed.    ED Treatments / Results  Labs (all labs ordered are listed, but only abnormal results are displayed) Labs Reviewed - No data to display  EKG  EKG Interpretation None       Radiology No results found.  Procedures Procedures (including critical care time)  Medications Ordered in ED Medications  HYDROcodone-acetaminophen (NORCO/VICODIN) 5-325 MG per tablet 1 tablet (not administered)     Initial Impression / Assessment and Plan / ED Course  I have reviewed the triage vital signs and the nursing notes.  Pertinent labs & imaging results that were available during my care of the patient were reviewed by me and considered in my medical decision making (see chart for details).     Pt with mmp presenting with severe tongue and right lower jaw pain that started at 2am or woke her at 2am this morning.  Most of the pain is located in the right side of tongue.  No signs of parotitis, swollen salivary glands or dental infection.  There are no lesions on the gums, tongue or cheek.  No swelling noted.  Airway is patent and no swallowing issues.  Doubt pt's sx are cardiac in nature.  She denies SOB, chest pain or weakness.  She took advil with some improvement but now starting to  return.  Concern for possible developing glossitis.  No signs of infectious requiring abx at this time.  Will attempt to control pain and then have pt f/u with dentist.  Final Clinical Impressions(s) / ED Diagnoses   Final diagnoses:  Glossitis    New Prescriptions New Prescriptions   HYDROCODONE-ACETAMINOPHEN (NORCO/VICODIN) 5-325 MG TABLET    Take 1 tablet by mouth every 6 (six) hours as needed for severe pain.     Blanchie Dessert, MD 05/24/16 [phone]

## 2016-05-24 NOTE — ED Notes (Signed)
ED Provider at bedside. 

## 2016-05-24 NOTE — ED Triage Notes (Signed)
Pt stats she woke at 2 am with right side jaw pain; pt ax 4 on arrival. Pt ambulated to bathroom with walker; Pt denies any other sx on arrival. Pt states pain at 10/10; Pt able to talk in full and complete sentences

## 2016-05-28 DIAGNOSIS — R69 Illness, unspecified: Secondary | ICD-10-CM | POA: Diagnosis not present

## 2016-06-15 DIAGNOSIS — H401232 Low-tension glaucoma, bilateral, moderate stage: Secondary | ICD-10-CM | POA: Diagnosis not present

## 2016-06-15 DIAGNOSIS — H524 Presbyopia: Secondary | ICD-10-CM | POA: Diagnosis not present

## 2016-06-18 ENCOUNTER — Other Ambulatory Visit: Payer: Self-pay | Admitting: Neurology

## 2016-07-05 DIAGNOSIS — Z0100 Encounter for examination of eyes and vision without abnormal findings: Secondary | ICD-10-CM | POA: Diagnosis not present

## 2016-09-02 DIAGNOSIS — I351 Nonrheumatic aortic (valve) insufficiency: Secondary | ICD-10-CM | POA: Diagnosis not present

## 2016-09-02 DIAGNOSIS — E7800 Pure hypercholesterolemia, unspecified: Secondary | ICD-10-CM | POA: Diagnosis not present

## 2016-09-02 DIAGNOSIS — I10 Essential (primary) hypertension: Secondary | ICD-10-CM | POA: Diagnosis not present

## 2016-09-02 DIAGNOSIS — I480 Paroxysmal atrial fibrillation: Secondary | ICD-10-CM | POA: Diagnosis not present

## 2016-09-29 ENCOUNTER — Other Ambulatory Visit: Payer: Self-pay | Admitting: Neurology

## 2016-10-07 ENCOUNTER — Other Ambulatory Visit: Payer: Self-pay | Admitting: Neurology

## 2016-10-12 ENCOUNTER — Telehealth: Payer: Self-pay | Admitting: *Deleted

## 2016-10-12 NOTE — Telephone Encounter (Signed)
Called and left detailed message for daughter, [name]. (on DPR). Advised we received refill request for diclofenac. Patient cannot take medication because she cannot take with Eliquis. Cannot take NSAIDS with Eliquis. Causes significant increase in risk for bleeding. Gave [facility] phone number if she has further questions or concerns.

## 2016-10-12 NOTE — Telephone Encounter (Signed)
This prescription has been denied on 2 occasions, the patient is on Eliquis. She cannot have nonsteroidal anti-inflammatory medications on this drug.

## 2016-10-12 NOTE — Telephone Encounter (Signed)
Received refill request from [facility] [address] for diclofenac tab 75mg  DR.

## 2016-10-19 ENCOUNTER — Encounter: Payer: Self-pay | Admitting: Neurology

## 2016-10-19 ENCOUNTER — Ambulatory Visit (INDEPENDENT_AMBULATORY_CARE_PROVIDER_SITE_OTHER): Payer: Medicare HMO | Admitting: Neurology

## 2016-10-19 VITALS — BP 198/70 | HR 65 | Ht 64.0 in | Wt 190.0 lb

## 2016-10-19 DIAGNOSIS — R2681 Unsteadiness on feet: Secondary | ICD-10-CM | POA: Diagnosis not present

## 2016-10-19 DIAGNOSIS — G35D Multiple sclerosis, unspecified: Secondary | ICD-10-CM

## 2016-10-19 DIAGNOSIS — G35 Multiple sclerosis: Secondary | ICD-10-CM | POA: Diagnosis not present

## 2016-10-19 DIAGNOSIS — R269 Unspecified abnormalities of gait and mobility: Secondary | ICD-10-CM

## 2016-10-19 NOTE — Progress Notes (Signed)
Reason for visit: Multiple sclerosis  Jillian Fisher is an 71 y.o. female  History of present illness:  Ms. Pestka is a 71 year old right-handed black female with a history of multiple sclerosis. The patient is on Betaseron, she has been tolerating the drug fairly well. The patient has been stable since last seen in January 2018. She was in the hospital on 02/24/2016 with onset of leg weakness, she was found to have a urinary tract infection which was treated and the leg weakness improved. The patient has not had any new symptoms of numbness, weakness, vision changes, or bowel or bladder control changes. The patient uses a walker for ambulation, she denies any falls. She lives at home with her husband. She returns to this office for an evaluation. She had blood work done through her primary care physician on 04/12/2016.  Past Medical History:  Diagnosis Date  . A-fib ([place])   . Abnormality of gait 01/25/2013  . Arthritis   . GERD (gastroesophageal reflux disease)   . Hypercholesteremia   . Hypertension   . Hypothyroidism   . Multiple sclerosis ([place])   . Neurogenic bladder 01/25/2013    Past Surgical History:  Procedure Laterality Date  . ABDOMINAL HYSTERECTOMY      Family History  Problem Relation Age of Onset  . Heart attack Mother   . Multiple sclerosis Sister   . Heart disease Sister   . Diabetes Other     Social history:  reports that she has never smoked. She has never used smokeless tobacco. She reports that she does not drink alcohol or use drugs.    Allergies  Allergen Reactions  . Sulfa Antibiotics Rash    Medications:  Prior to Admission medications   Medication Sig Start Date End Date Taking? Authorizing Provider  acetaminophen (TYLENOL) 500 MG tablet Take 1,000 mg by mouth every 6 (six) hours as needed. For pain   Yes [provider]  ALPRAZolam (XANAX) 0.25 MG tablet Take 0.25 mg by mouth daily as needed for anxiety. 12/25/15  Yes [provider]  BETASERON 0.3 MG KIT injection RECONSTITUTE AS DIRECTED PER LEAFLET AND INJECT 0.25MG (1 VIAL) SUBCUTANEOUSLY EVERY OTHER DAY. 06/18/16  Yes Kathrynn Ducking, MD  diltiazem (CARDIZEM CD) 240 MG 24 hr capsule Take 1 capsule (240 mg total) by mouth daily. 02/02/16  Yes Long, Wonda Olds, MD  levothyroxine (SYNTHROID, LEVOTHROID) 125 MCG tablet Take 125 mcg by mouth daily. 12/11/14  Yes [provider]  Multiple Vitamin (MULTIVITAMIN WITH MINERALS) TABS Take 1 tablet by mouth daily.   Yes [provider]  Nutritional Supplements (ESTROVEN PO) Take 1 tablet by mouth daily.   Yes [provider]  omeprazole (PRILOSEC) 20 MG capsule Take 20 mg by mouth daily.   Yes [provider]  oxybutynin (DITROPAN) 5 MG tablet Take 5 mg by mouth at bedtime.    Yes [provider]  rosuvastatin (CRESTOR) 20 MG tablet Take 10 mg by mouth daily.    Yes [provider]  apixaban (ELIQUIS) 5 MG TABS tablet Take 1 tablet (5 mg total) by mouth 2 (two) times daily. 02/02/16 03/03/16  LongWonda Olds, MD    ROS:  Out of a complete 14 system review of symptoms, the patient complains only of the following symptoms, and all other reviewed systems are negative.  Ringing in the ears Numbness  Blood pressure (!) 198/70, pulse 65, height '5\' 4"'  (1.626 m), weight 190 lb (86.2 kg), SpO2  97 %.  Physical Exam  General: The patient is alert and cooperative at the time of the examination.   Eyes: Pupils are equal, round, and reactive to light. Discs are flat bilaterally.  Skin: No significant peripheral edema is noted.   Neurologic Exam  Mental status: The patient is alert and oriented x 3 at the time of the examination. The patient has apparent normal recent and remote memory, with an apparently normal attention span and concentration ability.   Cranial nerves: Facial symmetry is present. Speech is normal, no aphasia or dysarthria is noted. Extraocular  movements are full. Visual fields are full.  Motor: The patient has good strength in all 4 extremities, with exception of 4/5 strength with hip flexion on the right.  Sensory examination: Soft touch sensation is symmetric on the face, arms, and legs.  Coordination: The patient has good finger-nose-finger and heel-to-shin bilaterally.  Gait and station: The patient has a slightly wide-based gait, the patient walks with a walker with good stability. Tandem gait was not attempted. Romberg is negative but is unsteady.  Reflexes: Deep tendon reflexes are symmetric.    MRI brain 02/24/16:  IMPRESSION: No acute intracranial process.  Severe chronic demyelination, stable from prior examination without enhancement to suggest acute demyelination.  No parenchymal brain volume loss for age.  * MRI scan images were reviewed online. I agree with the written report.    Assessment/Plan:  1. Multiple sclerosis  2. Gait disorder  The patient has done well on Betaseron, the patient will continue this medication, she is getting blood work done through her primary care physician. She will follow-up in 6 months, sooner if needed.  Jill Alexanders MD 10/19/2016 2:39 PM  [facility] [address] [place], [place] [address]  Phone [phone] Fax [phone]

## 2016-11-11 ENCOUNTER — Telehealth: Payer: Self-pay | Admitting: Neurology

## 2016-11-11 ENCOUNTER — Encounter: Payer: Self-pay | Admitting: Neurology

## 2016-11-11 NOTE — Telephone Encounter (Signed)
I will write a letter for jury duty. 

## 2016-11-12 NOTE — Telephone Encounter (Signed)
Called and spoke with [name], daughter. Advised letter ready as requested. She will pick up Monday. Advised we are open 8-5pm. She verbalized understanding.

## 2016-12-23 DIAGNOSIS — H401232 Low-tension glaucoma, bilateral, moderate stage: Secondary | ICD-10-CM | POA: Diagnosis not present

## 2016-12-23 DIAGNOSIS — H25013 Cortical age-related cataract, bilateral: Secondary | ICD-10-CM | POA: Diagnosis not present

## 2016-12-23 DIAGNOSIS — H2513 Age-related nuclear cataract, bilateral: Secondary | ICD-10-CM | POA: Diagnosis not present

## 2016-12-23 DIAGNOSIS — H35033 Hypertensive retinopathy, bilateral: Secondary | ICD-10-CM | POA: Diagnosis not present

## 2017-01-19 DIAGNOSIS — R69 Illness, unspecified: Secondary | ICD-10-CM | POA: Diagnosis not present

## 2017-04-06 DIAGNOSIS — R69 Illness, unspecified: Secondary | ICD-10-CM | POA: Diagnosis not present

## 2017-04-09 ENCOUNTER — Other Ambulatory Visit: Payer: Self-pay | Admitting: Neurology

## 2017-04-21 ENCOUNTER — Encounter (INDEPENDENT_AMBULATORY_CARE_PROVIDER_SITE_OTHER): Payer: Self-pay

## 2017-04-21 ENCOUNTER — Ambulatory Visit (INDEPENDENT_AMBULATORY_CARE_PROVIDER_SITE_OTHER): Payer: Medicare HMO | Admitting: Adult Health

## 2017-04-21 ENCOUNTER — Encounter: Payer: Self-pay | Admitting: Adult Health

## 2017-04-21 VITALS — BP 161/66 | HR 73 | Ht 64.0 in | Wt 184.0 lb

## 2017-04-21 DIAGNOSIS — G35 Multiple sclerosis: Secondary | ICD-10-CM

## 2017-04-21 DIAGNOSIS — Z5181 Encounter for therapeutic drug level monitoring: Secondary | ICD-10-CM

## 2017-04-21 NOTE — Progress Notes (Signed)
I have read the note, and I agree with the clinical assessment and plan.  Phillis Thackeray K Kaley Jutras   

## 2017-04-21 NOTE — Patient Instructions (Addendum)
Your Plan:  Continue Betaseron Blood work today MRI brain  If your symptoms worsen or you develop new symptoms please let us know.   Thank you for coming to see [place] at [facility]. I hope we have been able to provide you high quality care today.  You may receive a patient satisfaction survey over the next few weeks. We would appreciate your feedback and comments so that we may continue to improve ourselves and the health of our patients.

## 2017-04-21 NOTE — Progress Notes (Signed)
PATIENT: Jillian Fisher DOB: [birth year]  REASON FOR VISIT: follow up HISTORY FROM: patient  HISTORY OF PRESENT ILLNESS: Today 04/21/17 Jillian Fisher is a 72 year old female with a history of multiple sclerosis.  She returns today for follow-up.  She is currently on Betaseron and is tolerating the medication well.  She denies any new numbness or weakness.  Denies any change in her gait or balance.  Denies any changes with bowel or bladder.  No change with her vision.  Her last MRI of the brain was in November 2017 that was stable.  Patient states that she began feeling fatigued yesterday evening.  She states that she does feel weak in the legs.  Her daughter states that she sometimes has off days and will report the symptoms.  She states usually she is back at her baseline within 1-2 days.  Her daughter states that she stopped to see her yesterday and she did not report any symptoms and seemed to be doing well.  She reports that she always has weakness in the right leg.she returns today for an evaluation.  HISTORY Jillian Fisher is a 72 year old right-handed black female with a history of multiple sclerosis. The patient is on Betaseron, she has been tolerating the drug fairly well. The patient has been stable since last seen in January 2018. She was in the hospital on 02/24/2016 with onset of leg weakness, she was found to have a urinary tract infection which was treated and the leg weakness improved. The patient has not had any new symptoms of numbness, weakness, vision changes, or bowel or bladder control changes. The patient uses a walker for ambulation, she denies any falls. She lives at home with her husband. She returns to this office for an evaluation. She had blood work done through her primary care physician on 04/12/2016.   REVIEW OF SYSTEMS: Out of a complete 14 system review of symptoms, the patient complains only of the following symptoms, and all other reviewed systems are  negative.  See HPI  ALLERGIES: Allergies  Allergen Reactions  . Sulfa Antibiotics Rash    HOME MEDICATIONS: Outpatient Medications Prior to Visit  Medication Sig Dispense Refill  . acetaminophen (TYLENOL) 500 MG tablet Take 1,000 mg by mouth every 6 (six) hours as needed. For pain    . ALPRAZolam (XANAX) 0.25 MG tablet Take 0.25 mg by mouth daily as needed for anxiety.    Marland Kitchen apixaban (ELIQUIS) 5 MG TABS tablet Take 1 tablet (5 mg total) by mouth 2 (two) times daily. 60 tablet 0  . BETASERON 0.3 MG KIT injection RECONSTITUTE AS DIRECTED PER LEAFLET AND INJECT 0.25MG (1 VIAL) SUBCUTANEOUSLY EVERY OTHER DAY. 1 each 12  . diltiazem (CARDIZEM CD) 240 MG 24 hr capsule Take 1 capsule (240 mg total) by mouth daily. 30 capsule 0  . levothyroxine (SYNTHROID, LEVOTHROID) 125 MCG tablet Take 125 mcg by mouth daily.  3  . Multiple Vitamin (MULTIVITAMIN WITH MINERALS) TABS Take 1 tablet by mouth daily.    . Nutritional Supplements (ESTROVEN PO) Take 1 tablet by mouth daily.    Marland Kitchen omeprazole (PRILOSEC) 20 MG capsule Take 20 mg by mouth daily.    Marland Kitchen oxybutynin (DITROPAN) 5 MG tablet Take 5 mg by mouth at bedtime.     . rosuvastatin (CRESTOR) 20 MG tablet Take 10 mg by mouth daily.      No facility-administered medications prior to visit.     PAST MEDICAL HISTORY: Past Medical History:  Diagnosis Date  .  A-fib ([place])   . Abnormality of gait 01/25/2013  . Arthritis   . GERD (gastroesophageal reflux disease)   . Hypercholesteremia   . Hypertension   . Hypothyroidism   . Multiple sclerosis ([place])   . Neurogenic bladder 01/25/2013    PAST SURGICAL HISTORY: Past Surgical History:  Procedure Laterality Date  . ABDOMINAL HYSTERECTOMY      FAMILY HISTORY: Family History  Problem Relation Age of Onset  . Heart attack Mother   . Multiple sclerosis Sister   . Heart disease Sister   . Diabetes Other     SOCIAL HISTORY: Social History   Socioeconomic History  . Marital status: Married     Spouse name: [name]  . Number of children: 2  . Years of education: 8 th  . Highest education level: Not on file  Social Needs  . Financial resource strain: Not on file  . Food insecurity - worry: Not on file  . Food insecurity - inability: Not on file  . Transportation needs - medical: Not on file  . Transportation needs - non-medical: Not on file  Occupational History  . Occupation: retired  Tobacco Use  . Smoking status: Never Smoker  . Smokeless tobacco: Never Used  . Tobacco comment: quit 30-40 years ago (documented in 2013)  Substance and Sexual Activity  . Alcohol use: No    Alcohol/week: 0.0 oz  . Drug use: No  . Sexual activity: Not on file  Other Topics Concern  . Not on file  Social History Narrative   Patient lives at home with her husband.   Retired.   Education 8 th grade   Right handed.   Caffeine none.      PHYSICAL EXAM  Vitals:   04/21/17 0851  Height: _0  (1.626 m)   Body mass index is 32.61 kg/m.  Generalized: Well developed, in no acute distress   Neurological examination  Mentation: Alert oriented to time, place, history taking. Follows all commands speech and language fluent Cranial nerve II-XII: Pupils were equal round reactive to light. Extraocular movements were full, visual field were full on confrontational test. Facial sensation and strength were normal. Uvula tongue midline. Head turning and shoulder shrug  were normal and symmetric. Motor: The motor testing reveals 5 over 5 strength of all 4 extremities with the exception of slight weakness in the right lower extremity.  Good symmetric motor tone is noted throughout.  Sensory: Sensory testing is intact to soft touch on all 4 extremities. No evidence of extinction is noted.  Coordination: Cerebellar testing reveals good finger-nose-finger and heel-to-shin bilaterally.  Gait and station: Patient uses a walker when ambulating.  Patient is slightly drags the right foot when ambulating.   This has been ongoing since her diagnosis.  Tandem gait not attempted. Reflexes: Deep tendon reflexes are symmetric and normal bilaterally.   DIAGNOSTIC DATA (LABS, IMAGING, TESTING) - I reviewed patient records, labs, notes, testing and imaging myself where available.  Lab Results  Component Value Date   WBC 8.6 02/25/2016   HGB 13.4 02/25/2016   HCT 41.3 02/25/2016   MCV 89.4 02/25/2016   PLT 186 02/25/2016      Component Value Date/Time   NA 140 02/25/2016 0234   NA 139 01/09/2015 0915   K 4.6 02/25/2016 0234   CL 104 02/25/2016 0234   CO2 26 02/25/2016 0234   GLUCOSE 110 (H) 02/25/2016 0234   BUN 12 02/25/2016 0234   BUN 15 01/09/2015 0915   CREATININE  1.01 (H) 02/25/2016 0234   CALCIUM 9.3 02/25/2016 0234   PROT 7.9 01/09/2015 0915   ALBUMIN 4.0 01/09/2015 0915   AST 21 01/09/2015 0915   ALT 13 01/09/2015 0915   ALKPHOS 61 01/09/2015 0915   BILITOT 0.3 01/09/2015 0915   GFRNONAA 55 (L) 02/25/2016 0234   GFRAA >60 02/25/2016 0234    Lab Results  Component Value Date   TSH 1.006 02/25/2016      ASSESSMENT AND PLAN 72 y.o. year old female  has a past medical history of A-fib ([place]), Abnormality of gait (01/25/2013), Arthritis, GERD (gastroesophageal reflux disease), Hypercholesteremia, Hypertension, Hypothyroidism, Multiple sclerosis ([place]), and Neurogenic bladder (01/25/2013). here with:  1.  Multiple sclerosis  Overall the patient is doing well.  She will continue on Betaseron.  I will check blood work today.  We will recheck an MRI of the brain with and without contrast.  If her fatigue and feeling of weakness does not improve they will let us know.  If her symptoms worsen or she develops new symptoms she should let us know.  She will follow-up in 6 months or sooner if needed.  I spent 15 minutes with the patient. 50% of this time was spent discussing her medication and symptoms.     Ward Givens, MSN, NP-C 04/21/2017, 8:55 AM [facility] [address], [place], [place] [address] [phone]

## 2017-04-22 LAB — CBC WITH DIFFERENTIAL/PLATELET
BASOS ABS: 0 10*3/uL (ref 0.0–0.2)
Basos: 0 %
EOS (ABSOLUTE): 0.1 10*3/uL (ref 0.0–0.4)
Eos: 2 %
HEMOGLOBIN: 12 g/dL (ref 11.1–15.9)
Hematocrit: 37.9 % (ref 34.0–46.6)
Immature Grans (Abs): 0 10*3/uL (ref 0.0–0.1)
Immature Granulocytes: 0 %
LYMPHS ABS: 1.3 10*3/uL (ref 0.7–3.1)
LYMPHS: 29 %
MCH: 28.1 pg (ref 26.6–33.0)
MCHC: 31.7 g/dL (ref 31.5–35.7)
MCV: 89 fL (ref 79–97)
MONOCYTES: 13 %
Monocytes Absolute: 0.6 10*3/uL (ref 0.1–0.9)
NEUTROS PCT: 56 %
Neutrophils Absolute: 2.6 10*3/uL (ref 1.4–7.0)
Platelets: 251 10*3/uL (ref 150–379)
RBC: 4.27 x10E6/uL (ref 3.77–5.28)
RDW: 15.7 % — ABNORMAL HIGH (ref 12.3–15.4)
WBC: 4.5 10*3/uL (ref 3.4–10.8)

## 2017-04-22 LAB — COMPREHENSIVE METABOLIC PANEL
ALK PHOS: 51 IU/L (ref 39–117)
ALT: 14 IU/L (ref 0–32)
AST: 24 IU/L (ref 0–40)
Albumin/Globulin Ratio: 1.1 — ABNORMAL LOW (ref 1.2–2.2)
Albumin: 4.1 g/dL (ref 3.5–4.8)
BUN / CREAT RATIO: 9 — AB (ref 12–28)
BUN: 11 mg/dL (ref 8–27)
Bilirubin Total: 0.3 mg/dL (ref 0.0–1.2)
CO2: 25 mmol/L (ref 20–29)
Calcium: 10 mg/dL (ref 8.7–10.3)
Chloride: 100 mmol/L (ref 96–106)
Creatinine, Ser: 1.21 mg/dL — ABNORMAL HIGH (ref 0.57–1.00)
GFR calc Af Amer: 52 mL/min/{1.73_m2} — ABNORMAL LOW (ref >59)
GFR calc non Af Amer: 45 mL/min/{1.73_m2} — ABNORMAL LOW (ref >59)
GLUCOSE: 133 mg/dL — AB (ref 65–99)
Globulin, Total: 3.7 g/dL (ref 1.5–4.5)
Potassium: 4 mmol/L (ref 3.5–5.2)
Sodium: 141 mmol/L (ref 134–144)
Total Protein: 7.8 g/dL (ref 6.0–8.5)

## 2017-04-25 ENCOUNTER — Telehealth: Payer: Self-pay | Admitting: *Deleted

## 2017-04-25 NOTE — Telephone Encounter (Signed)
Spoke with patient and informed her that her labs showed abnormal kidney function. All other blood work is relatively unremarkable. Advised her a copy will be sent to her primary care provider, Dr Adelene Amas for his review. She verbalized understanding, appreciation. Labs successfully faxed to Dr Clovis Riley.

## 2017-05-04 NOTE — Telephone Encounter (Signed)
Pt's daughter called, she said her mother did not understand what was told to her about the labs. I relayed msg to her, she understood, did not have any questions and was appreciative.   FYI

## 2017-05-14 ENCOUNTER — Other Ambulatory Visit: Payer: Commercial Managed Care - HMO

## 2017-05-21 ENCOUNTER — Ambulatory Visit
Admission: RE | Admit: 2017-05-21 | Discharge: 2017-05-21 | Disposition: A | Discharge status: Home / Self Care | Payer: Commercial Managed Care - HMO | Source: Ambulatory Visit | Attending: Adult Health | Admitting: Adult Health

## 2017-05-21 DIAGNOSIS — G35 Multiple sclerosis: Secondary | ICD-10-CM | POA: Diagnosis not present

## 2017-05-21 MED ORDER — GADOBENATE DIMEGLUMINE 529 MG/ML IV SOLN
17.0000 mL | Freq: Once | INTRAVENOUS | Status: AC | PRN
  Administered 2017-05-21: 17 mL via INTRAVENOUS

## 2017-05-25 ENCOUNTER — Telehealth: Payer: Self-pay | Admitting: *Deleted

## 2017-05-25 NOTE — Telephone Encounter (Signed)
Spoke to pt and relayed that her MRI results were no change from previous MRI.  Incidental finding showed small spots that showed ? Unknown cause , benign finding as pt did not have sx of orthostatic headaches.  When in for f/u can ask more specifics.  She verbalized understanding.

## 2017-05-25 NOTE — Telephone Encounter (Signed)
-----   Message from Butch Penny, NP sent at 05/24/2017  2:29 PM EST ----- No change from previous MRI.  Incidentally there was mild pachymeningeal enhancement-this could be idiopathic or due to intracranial hypertension.  The patient was complaining of any symptoms related to intracranial hypotension or orthostatic headaches.

## 2017-06-20 DIAGNOSIS — M67432 Ganglion, left wrist: Secondary | ICD-10-CM | POA: Diagnosis not present

## 2017-06-20 DIAGNOSIS — G500 Trigeminal neuralgia: Secondary | ICD-10-CM | POA: Diagnosis not present

## 2017-07-05 ENCOUNTER — Telehealth: Payer: Self-pay | Admitting: Neurology

## 2017-07-05 ENCOUNTER — Emergency Department (HOSPITAL_COMMUNITY)
Admission: EM | Admit: 2017-07-05 | Discharge: 2017-07-05 | Disposition: A | Discharge status: Home / Self Care | Payer: Medicare HMO | Attending: Emergency Medicine | Admitting: Emergency Medicine

## 2017-07-05 ENCOUNTER — Encounter (HOSPITAL_COMMUNITY): Payer: Self-pay

## 2017-07-05 DIAGNOSIS — M26621 Arthralgia of right temporomandibular joint: Secondary | ICD-10-CM | POA: Diagnosis not present

## 2017-07-05 DIAGNOSIS — M26629 Arthralgia of temporomandibular joint, unspecified side: Secondary | ICD-10-CM

## 2017-07-05 DIAGNOSIS — M2669 Other specified disorders of temporomandibular joint: Secondary | ICD-10-CM | POA: Insufficient documentation

## 2017-07-05 DIAGNOSIS — R6884 Jaw pain: Secondary | ICD-10-CM | POA: Diagnosis present

## 2017-07-05 DIAGNOSIS — E039 Hypothyroidism, unspecified: Secondary | ICD-10-CM | POA: Insufficient documentation

## 2017-07-05 DIAGNOSIS — I10 Essential (primary) hypertension: Secondary | ICD-10-CM | POA: Insufficient documentation

## 2017-07-05 DIAGNOSIS — Z79899 Other long term (current) drug therapy: Secondary | ICD-10-CM | POA: Diagnosis not present

## 2017-07-05 MED ORDER — PREDNISONE 10 MG PO TABS: ORAL_TABLET | ORAL | 0 refills | Status: DC

## 2017-07-05 MED ORDER — HYDROCODONE-ACETAMINOPHEN 5-325 MG PO TABS: 1.0000 | ORAL_TABLET | Freq: Four times a day (QID) | ORAL | 0 refills | Status: DC | PRN

## 2017-07-05 MED ORDER — MORPHINE SULFATE (PF) 4 MG/ML IV SOLN
6.0000 mg | Freq: Once | INTRAVENOUS | Status: AC
  Administered 2017-07-05: 6 mg via INTRAMUSCULAR
  Filled 2017-07-05: qty 2

## 2017-07-05 NOTE — Telephone Encounter (Signed)
I called the patient and talk with the daughter.  The patient over the last 2 or 3 months has had what sounds like trigeminal neuralgia pain which sharp jabbing pain lasting up to 5 minutes.  She has not fully responded to gabapentin.  I will start prednisone Dosepak and get a revisit for her, we may need to start carbamazepine or Trileptal.

## 2017-07-05 NOTE — Telephone Encounter (Signed)
Dr Willis- ok to work pt in?  

## 2017-07-05 NOTE — Telephone Encounter (Signed)
Called daughter back. Scheduled work in visit for 07/07/17 at 730am. She verbalized understanding and appreciation for call.

## 2017-07-05 NOTE — ED Provider Notes (Signed)
[facility] EMERGENCY DEPARTMENT Provider Note   CSN: [id number] Arrival date & time: 07/05/17  4098     History   Chief Complaint Chief Complaint  Patient presents with  . Jaw Pain    HPI Jillian Fisher is a 72 y.o. female.  Patient is a 71-year-old female with a history of MS who presents with right jaw pain.  She states is been going on about 2-3 months but continuously worsening.  She has no associated swelling.  No numbness of her face.  She has pain on movement of her jaw.  No dental pain.  No ear pain.  No fevers.  No chest pain or shortness of breath.  She has been seen by her dentist and an oral surgeon who did not find any dental etiology for the pain.  She was started on gabapentin and has maxed out on her dose of gabapentin with no significant improvement in symptoms.  She tried tramadol last week with some improvement but the pain is continued to get worse.  She apparently is awaiting for referral by her PCP for specialist evaluation.     Past Medical History:  Diagnosis Date  . A-fib ([place])   . Abnormality of gait 01/25/2013  . Arthritis   . GERD (gastroesophageal reflux disease)   . Hypercholesteremia   . Hypertension   . Hypothyroidism   . Multiple sclerosis ([place])   . Neurogenic bladder 01/25/2013    Patient Active Problem List   Diagnosis Date Noted  . Right leg weakness 02/25/2016  . Lower urinary tract infectious disease 02/25/2016  . PAF (paroxysmal atrial fibrillation) ([place]) 02/25/2016  . Weakness of right lower extremity 02/25/2016  . Gait instability 02/24/2016  . Multiple sclerosis ([place]) 01/25/2013  . Abnormality of gait 01/25/2013  . Neurogenic bladder 01/25/2013    Past Surgical History:  Procedure Laterality Date  . ABDOMINAL HYSTERECTOMY       OB History   None      Home Medications    Prior to Admission medications   Medication Sig Start Date End Date Taking? Authorizing Provider  acetaminophen (TYLENOL)  500 MG tablet Take 1,000 mg by mouth every 6 (six) hours as needed. For pain    [provider]  ALPRAZolam (XANAX) 0.25 MG tablet Take 0.25 mg by mouth daily as needed for anxiety. 12/25/15   [provider]  apixaban (ELIQUIS) 5 MG TABS tablet Take 1 tablet (5 mg total) by mouth 2 (two) times daily. 02/02/16 04/21/17  Long, Wonda Olds, MD  BETASERON 0.3 MG KIT injection RECONSTITUTE AS DIRECTED PER LEAFLET AND INJECT 0.25MG (1 VIAL) SUBCUTANEOUSLY EVERY OTHER DAY. 04/12/17   Kathrynn Ducking, MD  diltiazem (CARDIZEM CD) 240 MG 24 hr capsule Take 1 capsule (240 mg total) by mouth daily. 02/02/16   Long, Wonda Olds, MD  HYDROcodone-acetaminophen (NORCO/VICODIN) 5-325 MG tablet Take 1-2 tablets by mouth every 6 (six) hours as needed. 07/05/17   Malvin Johns, MD  levothyroxine (SYNTHROID, LEVOTHROID) 125 MCG tablet Take 125 mcg by mouth daily. 12/11/14   [provider]  Multiple Vitamin (MULTIVITAMIN WITH MINERALS) TABS Take 1 tablet by mouth daily.    [provider]  Nutritional Supplements (ESTROVEN PO) Take 1 tablet by mouth daily.    [provider]  omeprazole (PRILOSEC) 20 MG capsule Take 20 mg by mouth daily.    [provider]  oxybutynin (DITROPAN) 5 MG tablet Take 5 mg by mouth at bedtime.  [provider]  rosuvastatin (CRESTOR) 20 MG tablet Take 10 mg by mouth daily.     [provider]    Family History Family History  Problem Relation Age of Onset  . Heart attack Mother   . Multiple sclerosis Sister   . Heart disease Sister   . Diabetes Other     Social History Social History   Tobacco Use  . Smoking status: Never Smoker  . Smokeless tobacco: Never Used  . Tobacco comment: quit 30-40 years ago (documented in 2013)  Substance Use Topics  . Alcohol use: No    Alcohol/week: 0.0 oz  . Drug use: No     Allergies   Sulfa antibiotics   Review of Systems Review of Systems  Constitutional: Negative for  chills, diaphoresis, fatigue and fever.  HENT: Negative for congestion, rhinorrhea and sneezing.        Jaw pain  Eyes: Negative.   Respiratory: Negative for cough, chest tightness and shortness of breath.   Cardiovascular: Negative for chest pain and leg swelling.  Gastrointestinal: Negative for abdominal pain, blood in stool, diarrhea, nausea and vomiting.  Genitourinary: Negative for difficulty urinating, flank pain, frequency and hematuria.  Musculoskeletal: Negative for arthralgias and back pain.  Skin: Negative for rash.  Neurological: Negative for dizziness, speech difficulty, weakness, numbness and headaches.     Physical Exam Updated Vital Signs BP (!) 173/68   Pulse 61   Temp 98 F (36.7 C) (Oral)   Resp 16   SpO2 100%   Physical Exam  Constitutional: She is oriented to person, place, and time. She appears well-developed and well-nourished.  HENT:  Head: Normocephalic and atraumatic.  Patient has pain in her right TMJ area with some clicking on movement of the TMJ.  There is pain on movement of the TMJ.  There is no pain along the teeth, or lack thereof in her right mandible.  No facial swelling.  No trismus.  Her right TM appears normal.  Normal sensation in all nerve distributions of the face.  Eyes: Pupils are equal, round, and reactive to light.  Neck: Normal range of motion. Neck supple.  Cardiovascular: Normal rate, regular rhythm and normal heart sounds.  Pulmonary/Chest: Effort normal and breath sounds normal. No respiratory distress. She has no wheezes. She has no rales. She exhibits no tenderness.  Abdominal: Soft. Bowel sounds are normal. There is no tenderness. There is no rebound and no guarding.  Musculoskeletal: Normal range of motion. She exhibits no edema.  Lymphadenopathy:    She has no cervical adenopathy.  Neurological: She is alert and oriented to person, place, and time.  Skin: Skin is warm and dry. No rash noted.  Psychiatric: She has a normal  mood and affect.     ED Treatments / Results  Labs (all labs ordered are listed, but only abnormal results are displayed) Labs Reviewed - No data to display  EKG None  Radiology No results found.  Procedures Procedures (including critical care time)  Medications Ordered in ED Medications  morphine 4 MG/ML injection 6 mg (has no administration in time range)     Initial Impression / Assessment and Plan / ED Course  I have reviewed the triage vital signs and the nursing notes.  Pertinent labs & imaging results that were available during my care of the patient were reviewed by me and considered in my medical decision making (see chart for details).     Patient has pain to her right jaw.  It seems consistent with TMJ.  There is no suggestions of infection or other etiologies for her pain.  No sensory deficits to her face.  She was discharged home in good condition.  I gave her a prescription for Vicodin.  She was encouraged to follow-up with her PCP.  Final Clinical Impressions(s) / ED Diagnoses   Final diagnoses:  TMJ syndrome    ED Discharge Orders        Ordered    HYDROcodone-acetaminophen (NORCO/VICODIN) 5-325 MG tablet  Every 6 hours PRN     07/05/17 1011       Malvin Johns, MD 07/05/17 1418

## 2017-07-05 NOTE — Telephone Encounter (Signed)
Pts daughter requesting a call back to discuss an appt, stating the pt has been having nerve pain for a little over 2 months. Stating she has been to the ED and PCP which both think it could be TMJ. Please call to advise

## 2017-07-05 NOTE — ED Triage Notes (Signed)
Pt presents for ongoing right sided jaw pain x 2-3 months. Pt has been seen by dentist, oral surgeon and PCP with minimal improvement. Has been told it is likely trigeminal neuralgia but is on maximal dosing of gabapentin with no improvement. Pt reports unable to tolerating chewing food. Denies CP, sob or radiation of pain. No fevers.

## 2017-07-07 ENCOUNTER — Ambulatory Visit (INDEPENDENT_AMBULATORY_CARE_PROVIDER_SITE_OTHER): Payer: Medicare HMO | Admitting: Neurology

## 2017-07-07 ENCOUNTER — Encounter: Payer: Self-pay | Admitting: Neurology

## 2017-07-07 ENCOUNTER — Other Ambulatory Visit: Payer: Self-pay

## 2017-07-07 VITALS — BP 129/65 | HR 67 | Ht 64.0 in | Wt 177.0 lb

## 2017-07-07 DIAGNOSIS — R269 Unspecified abnormalities of gait and mobility: Secondary | ICD-10-CM

## 2017-07-07 DIAGNOSIS — G35 Multiple sclerosis: Secondary | ICD-10-CM

## 2017-07-07 DIAGNOSIS — G500 Trigeminal neuralgia: Secondary | ICD-10-CM

## 2017-07-07 DIAGNOSIS — N319 Neuromuscular dysfunction of bladder, unspecified: Secondary | ICD-10-CM

## 2017-07-07 HISTORY — DX: Trigeminal neuralgia: G50.0

## 2017-07-07 MED ORDER — GABAPENTIN 300 MG PO CAPS: 300.0000 mg | ORAL_CAPSULE | Freq: Four times a day (QID) | ORAL | 11 refills | Status: DC

## 2017-07-07 MED ORDER — OXCARBAZEPINE 150 MG PO TABS: ORAL_TABLET | ORAL | 3 refills | Status: DC

## 2017-07-07 NOTE — Patient Instructions (Signed)
   We will start Trileptal for the pain, go on 150 mg twice a day for 1 week, then take 2 tablets twice a day.  Trileptal (oxcarbazepine) is a seizure medication that is sometimes also used for nerve related pain. As with any seizure medication, this drug may worsen depression. Occasionally, there may be other side effects that include low sodium levels, drowsiness, incoordination, dizziness, headache, nausea, double vision, or a potential allergy involving a skin rash. If you believe that you are having side effects on this medication, please contact our office.

## 2017-07-07 NOTE — Progress Notes (Addendum)
Reason for visit: Multiple sclerosis, trigeminal neuralgia  Jillian Fisher is an 72 y.o. female  History of present illness:  Jillian Fisher is a 72 year old right-handed black female with a history of multiple sclerosis associated with a neurogenic bladder and a gait disorder.  The patient has undergone a recent MRI of the brain that appears to be stable, the patient has been treated with Betaseron.  The patient comes to the office today for an urgent problem that began about 2 or 3 months ago.  The patient has developed sharp jabbing pains in the right V3 distribution that may last up to 5 minutes.  The patient has some dull achy pain underlying this.  She has undergone several dental procedures, and the source of the pain was not determined by the dentist or oral surgeon.  The patient has not been sleeping well, she has not been able to eat as this induces pain.  Talking also induced pain.  She has lost about 25 pounds in the last 3 months.  She has been placed on gabapentin, currently on 300 mg 4 times daily.  She comes to this office for further evaluation.  She was in the emergency room on 05 July 2017 with severe pain.  Past Medical History:  Diagnosis Date  . A-fib ([place])   . Abnormality of gait 01/25/2013  . Arthritis   . GERD (gastroesophageal reflux disease)   . Hypercholesteremia   . Hypertension   . Hypothyroidism   . Multiple sclerosis ([place])   . Neurogenic bladder 01/25/2013    Past Surgical History:  Procedure Laterality Date  . ABDOMINAL HYSTERECTOMY      Family History  Problem Relation Age of Onset  . Heart attack Mother   . Multiple sclerosis Sister   . Heart disease Sister   . Diabetes Other     Social history:  reports that she has never smoked. She has never used smokeless tobacco. She reports that she does not drink alcohol or use drugs.    Allergies  Allergen Reactions  . Sulfa Antibiotics Rash    Medications:  Prior to Admission medications     Medication Sig Start Date End Date Taking? Authorizing Provider  acetaminophen (TYLENOL) 500 MG tablet Take 1,000 mg by mouth every 6 (six) hours as needed. For pain   Yes [provider]  ALPRAZolam (XANAX) 0.25 MG tablet Take 0.25 mg by mouth daily as needed for anxiety. 12/25/15  Yes [provider]  BETASERON 0.3 MG KIT injection RECONSTITUTE AS DIRECTED PER LEAFLET AND INJECT 0.25MG (1 VIAL) SUBCUTANEOUSLY EVERY OTHER DAY. 04/12/17  Yes Kathrynn Ducking, MD  diltiazem (CARDIZEM CD) 240 MG 24 hr capsule Take 1 capsule (240 mg total) by mouth daily. 02/02/16  Yes Long, Wonda Olds, MD  levothyroxine (SYNTHROID, LEVOTHROID) 125 MCG tablet Take 125 mcg by mouth daily. 12/11/14  Yes [provider]  Multiple Vitamin (MULTIVITAMIN WITH MINERALS) TABS Take 1 tablet by mouth daily.   Yes [provider]  Nutritional Supplements (ESTROVEN PO) Take 1 tablet by mouth daily.   Yes [provider]  omeprazole (PRILOSEC) 20 MG capsule Take 20 mg by mouth daily.   Yes [provider]  oxybutynin (DITROPAN) 5 MG tablet Take 5 mg by mouth at bedtime.    Yes [provider]  predniSONE (DELTASONE) 10 MG tablet Begin taking 6 tablets daily, taper by one tablet every other day until off the medication. 07/05/17  Yes Kathrynn Ducking,  MD  rosuvastatin (CRESTOR) 20 MG tablet Take 10 mg by mouth daily.    Yes [provider]  apixaban (ELIQUIS) 5 MG TABS tablet Take 1 tablet (5 mg total) by mouth 2 (two) times daily. 02/02/16 04/21/17  Long, Wonda Olds, MD    ROS:  Out of a complete 14 system review of symptoms, the patient complains only of the following symptoms, and all other reviewed systems are negative.  Environmental allergies Facial pain  Blood pressure 129/65, pulse 67, height 5' 4" (1.626 m), weight 177 lb (80.3 kg).  Physical Exam  General: The patient is alert and cooperative at the time of the examination.  Skin: No significant  peripheral edema is noted.   Neurologic Exam  Mental status: The patient is alert and oriented x 3 at the time of the examination. The patient has apparent normal recent and remote memory, with an apparently normal attention span and concentration ability.   Cranial nerves: Facial symmetry is present. Speech is normal, no aphasia or dysarthria is noted. Extraocular movements are full. Visual fields are full.  Motor: The patient has good strength in all 4 extremities.  Sensory examination: Soft touch sensation is symmetric on the face, arms, and legs.  Coordination: The patient has good finger-nose-finger and heel-to-shin bilaterally.  Gait and station: The patient is able to stand with some assistance.  Once up, the patient has a wide-based gait, she can walk with minimal assistance.  Romberg is unsteady, the patient has a tendency to fall.  Tandem gait was not attempted.  Reflexes: Deep tendon reflexes are symmetric.   MRI brain 05/21/17:  IMPRESSION:  This MRI of the brain with and without contrast shows the following: 1.    There are multiple T2/FLAIR hyperintense foci in the right middle cerebellar peduncle, left thalamus and in the periventricular, juxtacortical and deep white matter of both hemispheres in a pattern and configuration consistent with chronic demyelinating plaque associated with multiple sclerosis. None of the foci appears to be acute. When compared to the previous MRI dated 02/24/2016,, there is no interim change. 2.    Two punctate chronic microhemorrhages in the right thalamus and left basal ganglia likely hypertensive in nature and unchanged when compared to the previous MRI. 3.    There is mild pachymeningeal enhancement not present on the previous MRI.  This could be idiopathic or due to intracranial hypotension.   If the patient is experiencing orthostatic headaches, consider further evaluation.  * MRI scan images were reviewed online. I agree with the written  report.    Assessment/Plan:  1.  Right V3 trigeminal neuralgia  2.  Multiple sclerosis  3.  Gait disorder  4.  Neurogenic bladder  The patient will remain on Betaseron.  She will start a prednisone Dosepak.  She will remain on the current dose of gabapentin, but this can be increased within the next several days if she tolerates the drug.  Trileptal will be added taking 150 mg twice daily for 1 week and then go to 300 mg twice daily.  The patient is on Eliquis and cannot take carbamazepine.  If she stays on Trileptal, blood work will need to be done in about 6 weeks to look for hyponatremia.  The patient may require gamma knife procedure if the pain cannot be fully controlled.  She will follow-up in 2 months.  The family will contact me for dose adjustments of the medications.  Jill Alexanders MD 07/07/2017 7:55 AM  [facility] [address] [place], [place] [address]  Phone [phone] Fax [phone]

## 2017-07-08 DIAGNOSIS — I10 Essential (primary) hypertension: Secondary | ICD-10-CM | POA: Diagnosis not present

## 2017-07-08 DIAGNOSIS — M8589 Other specified disorders of bone density and structure, multiple sites: Secondary | ICD-10-CM | POA: Diagnosis not present

## 2017-07-08 DIAGNOSIS — Z0001 Encounter for general adult medical examination with abnormal findings: Secondary | ICD-10-CM | POA: Diagnosis not present

## 2017-07-08 DIAGNOSIS — I4891 Unspecified atrial fibrillation: Secondary | ICD-10-CM | POA: Diagnosis not present

## 2017-07-08 DIAGNOSIS — E7800 Pure hypercholesterolemia, unspecified: Secondary | ICD-10-CM | POA: Diagnosis not present

## 2017-07-08 DIAGNOSIS — G500 Trigeminal neuralgia: Secondary | ICD-10-CM | POA: Diagnosis not present

## 2017-07-08 DIAGNOSIS — N3281 Overactive bladder: Secondary | ICD-10-CM | POA: Diagnosis not present

## 2017-07-08 DIAGNOSIS — Z1211 Encounter for screening for malignant neoplasm of colon: Secondary | ICD-10-CM | POA: Diagnosis not present

## 2017-07-08 DIAGNOSIS — K2970 Gastritis, unspecified, without bleeding: Secondary | ICD-10-CM | POA: Diagnosis not present

## 2017-07-08 DIAGNOSIS — E039 Hypothyroidism, unspecified: Secondary | ICD-10-CM | POA: Diagnosis not present

## 2017-07-21 DIAGNOSIS — R300 Dysuria: Secondary | ICD-10-CM | POA: Diagnosis not present

## 2017-07-27 ENCOUNTER — Telehealth: Payer: Self-pay | Admitting: Neurology

## 2017-07-27 DIAGNOSIS — G35 Multiple sclerosis: Secondary | ICD-10-CM

## 2017-07-27 MED ORDER — PREDNISONE 10 MG PO TABS: ORAL_TABLET | ORAL | 0 refills | Status: DC

## 2017-07-27 NOTE — Telephone Encounter (Signed)
I called the patient.  She has had a problem with right leg weakness and numbness since the weekend, 3-4 days ago.  She is no longer able to ambulate, they are trying to get a wheelchair for the house.  I will get her on a prednisone Dosepak 10 mg 12-day pack, in the meantime try to get home health therapy after the house for a 3-day course of Solu-Medrol, 500 mg daily.  She will stop the prednisone while on Solu-Medrol, restart prednisone when this is stopped.  I will try to get physical therapy to work with the patient as well, [facility].

## 2017-07-27 NOTE — Telephone Encounter (Signed)
Patient's daughter calling stating for the past few days patient's right leg is very weak, cannot stand or walk on it. Please call and discuss.

## 2017-07-27 NOTE — Addendum Note (Signed)
Addended by: York Spaniel on: 07/27/2017 03:19 PM   Modules accepted: Orders

## 2017-07-27 NOTE — Telephone Encounter (Addendum)
Called pt. Scheduled work in visit for 08/08/17 at 730am, check in 700am. She verbalized understanding and requested I also call daughter.  I tried calling daughter, unable to LVM. Can relay above information if she calls, thank you. I know this appt is early, but there are no other openings at this time. I will call if any later appt open up.

## 2017-07-28 NOTE — Telephone Encounter (Signed)
Called daughter [name], was able to LVM letting her know we scheduled work in visit for 08/08/17 at 730am, check in 0700am. Gave [facility] phone number if she has further questions/concerns.

## 2017-07-30 DIAGNOSIS — I10 Essential (primary) hypertension: Secondary | ICD-10-CM | POA: Diagnosis not present

## 2017-07-30 DIAGNOSIS — E039 Hypothyroidism, unspecified: Secondary | ICD-10-CM | POA: Diagnosis not present

## 2017-07-30 DIAGNOSIS — G35 Multiple sclerosis: Secondary | ICD-10-CM | POA: Diagnosis not present

## 2017-07-30 DIAGNOSIS — N319 Neuromuscular dysfunction of bladder, unspecified: Secondary | ICD-10-CM | POA: Diagnosis not present

## 2017-07-30 DIAGNOSIS — M1990 Unspecified osteoarthritis, unspecified site: Secondary | ICD-10-CM | POA: Diagnosis not present

## 2017-07-30 DIAGNOSIS — K219 Gastro-esophageal reflux disease without esophagitis: Secondary | ICD-10-CM | POA: Diagnosis not present

## 2017-07-30 DIAGNOSIS — G500 Trigeminal neuralgia: Secondary | ICD-10-CM | POA: Diagnosis not present

## 2017-07-30 DIAGNOSIS — E785 Hyperlipidemia, unspecified: Secondary | ICD-10-CM | POA: Diagnosis not present

## 2017-07-30 DIAGNOSIS — I4891 Unspecified atrial fibrillation: Secondary | ICD-10-CM | POA: Diagnosis not present

## 2017-08-01 DIAGNOSIS — K219 Gastro-esophageal reflux disease without esophagitis: Secondary | ICD-10-CM | POA: Diagnosis not present

## 2017-08-01 DIAGNOSIS — I10 Essential (primary) hypertension: Secondary | ICD-10-CM | POA: Diagnosis not present

## 2017-08-01 DIAGNOSIS — E039 Hypothyroidism, unspecified: Secondary | ICD-10-CM | POA: Diagnosis not present

## 2017-08-01 DIAGNOSIS — I4891 Unspecified atrial fibrillation: Secondary | ICD-10-CM | POA: Diagnosis not present

## 2017-08-01 DIAGNOSIS — G35 Multiple sclerosis: Secondary | ICD-10-CM | POA: Diagnosis not present

## 2017-08-01 DIAGNOSIS — E785 Hyperlipidemia, unspecified: Secondary | ICD-10-CM | POA: Diagnosis not present

## 2017-08-01 DIAGNOSIS — N319 Neuromuscular dysfunction of bladder, unspecified: Secondary | ICD-10-CM | POA: Diagnosis not present

## 2017-08-01 DIAGNOSIS — M1990 Unspecified osteoarthritis, unspecified site: Secondary | ICD-10-CM | POA: Diagnosis not present

## 2017-08-01 DIAGNOSIS — G500 Trigeminal neuralgia: Secondary | ICD-10-CM | POA: Diagnosis not present

## 2017-08-02 ENCOUNTER — Telehealth: Payer: Self-pay | Admitting: *Deleted

## 2017-08-02 DIAGNOSIS — I4891 Unspecified atrial fibrillation: Secondary | ICD-10-CM | POA: Diagnosis not present

## 2017-08-02 DIAGNOSIS — M1990 Unspecified osteoarthritis, unspecified site: Secondary | ICD-10-CM | POA: Diagnosis not present

## 2017-08-02 DIAGNOSIS — E785 Hyperlipidemia, unspecified: Secondary | ICD-10-CM | POA: Diagnosis not present

## 2017-08-02 DIAGNOSIS — I10 Essential (primary) hypertension: Secondary | ICD-10-CM | POA: Diagnosis not present

## 2017-08-02 DIAGNOSIS — K219 Gastro-esophageal reflux disease without esophagitis: Secondary | ICD-10-CM | POA: Diagnosis not present

## 2017-08-02 DIAGNOSIS — G500 Trigeminal neuralgia: Secondary | ICD-10-CM | POA: Diagnosis not present

## 2017-08-02 DIAGNOSIS — E039 Hypothyroidism, unspecified: Secondary | ICD-10-CM | POA: Diagnosis not present

## 2017-08-02 DIAGNOSIS — G35 Multiple sclerosis: Secondary | ICD-10-CM | POA: Diagnosis not present

## 2017-08-02 DIAGNOSIS — N319 Neuromuscular dysfunction of bladder, unspecified: Secondary | ICD-10-CM | POA: Diagnosis not present

## 2017-08-02 NOTE — Telephone Encounter (Signed)
Faxed signed order back to [facility] at [phone] re: Solumedrol 500mg  q24hrx3days. Received fax confirmation.

## 2017-08-03 ENCOUNTER — Telehealth: Payer: Self-pay | Admitting: Neurology

## 2017-08-03 NOTE — Telephone Encounter (Signed)
Also faxed signed order back to [facility] re: RN visits: [id number] as needed for IV catheter or infusion complications. To administer IV Solumedrol daily for 3 days". Fax: [phone]. Received fax confirmation. Date on order: 07/30/17.

## 2017-08-03 NOTE — Telephone Encounter (Signed)
I called the therapist, Amber.  Okay to perform physical therapy twice week for the next 3 weeks.

## 2017-08-03 NOTE — Telephone Encounter (Signed)
Jillian Fisher PT with [facility] requesting verbal orders for PT twice a week for 3 week to improve balance and strength. Jillian Fisher can be reached at [phone]

## 2017-08-04 DIAGNOSIS — I4891 Unspecified atrial fibrillation: Secondary | ICD-10-CM | POA: Diagnosis not present

## 2017-08-04 DIAGNOSIS — G35 Multiple sclerosis: Secondary | ICD-10-CM | POA: Diagnosis not present

## 2017-08-04 DIAGNOSIS — E039 Hypothyroidism, unspecified: Secondary | ICD-10-CM | POA: Diagnosis not present

## 2017-08-04 DIAGNOSIS — M8589 Other specified disorders of bone density and structure, multiple sites: Secondary | ICD-10-CM | POA: Diagnosis not present

## 2017-08-05 DIAGNOSIS — G500 Trigeminal neuralgia: Secondary | ICD-10-CM | POA: Diagnosis not present

## 2017-08-05 DIAGNOSIS — K219 Gastro-esophageal reflux disease without esophagitis: Secondary | ICD-10-CM | POA: Diagnosis not present

## 2017-08-05 DIAGNOSIS — E039 Hypothyroidism, unspecified: Secondary | ICD-10-CM | POA: Diagnosis not present

## 2017-08-05 DIAGNOSIS — N319 Neuromuscular dysfunction of bladder, unspecified: Secondary | ICD-10-CM | POA: Diagnosis not present

## 2017-08-05 DIAGNOSIS — M1990 Unspecified osteoarthritis, unspecified site: Secondary | ICD-10-CM | POA: Diagnosis not present

## 2017-08-05 DIAGNOSIS — I10 Essential (primary) hypertension: Secondary | ICD-10-CM | POA: Diagnosis not present

## 2017-08-05 DIAGNOSIS — G35 Multiple sclerosis: Secondary | ICD-10-CM | POA: Diagnosis not present

## 2017-08-05 DIAGNOSIS — I4891 Unspecified atrial fibrillation: Secondary | ICD-10-CM | POA: Diagnosis not present

## 2017-08-05 DIAGNOSIS — E785 Hyperlipidemia, unspecified: Secondary | ICD-10-CM | POA: Diagnosis not present

## 2017-08-08 ENCOUNTER — Ambulatory Visit (INDEPENDENT_AMBULATORY_CARE_PROVIDER_SITE_OTHER): Payer: Medicare HMO | Admitting: Neurology

## 2017-08-08 ENCOUNTER — Telehealth: Payer: Self-pay | Admitting: Neurology

## 2017-08-08 ENCOUNTER — Encounter: Payer: Self-pay | Admitting: Neurology

## 2017-08-08 VITALS — BP 145/78 | HR 75

## 2017-08-08 DIAGNOSIS — I4891 Unspecified atrial fibrillation: Secondary | ICD-10-CM | POA: Diagnosis not present

## 2017-08-08 DIAGNOSIS — K219 Gastro-esophageal reflux disease without esophagitis: Secondary | ICD-10-CM | POA: Diagnosis not present

## 2017-08-08 DIAGNOSIS — Z5181 Encounter for therapeutic drug level monitoring: Secondary | ICD-10-CM

## 2017-08-08 DIAGNOSIS — R29898 Other symptoms and signs involving the musculoskeletal system: Secondary | ICD-10-CM | POA: Diagnosis not present

## 2017-08-08 DIAGNOSIS — G35 Multiple sclerosis: Secondary | ICD-10-CM | POA: Diagnosis not present

## 2017-08-08 DIAGNOSIS — I10 Essential (primary) hypertension: Secondary | ICD-10-CM | POA: Diagnosis not present

## 2017-08-08 DIAGNOSIS — M1990 Unspecified osteoarthritis, unspecified site: Secondary | ICD-10-CM | POA: Diagnosis not present

## 2017-08-08 DIAGNOSIS — G500 Trigeminal neuralgia: Secondary | ICD-10-CM | POA: Diagnosis not present

## 2017-08-08 DIAGNOSIS — N319 Neuromuscular dysfunction of bladder, unspecified: Secondary | ICD-10-CM | POA: Diagnosis not present

## 2017-08-08 DIAGNOSIS — R269 Unspecified abnormalities of gait and mobility: Secondary | ICD-10-CM | POA: Diagnosis not present

## 2017-08-08 DIAGNOSIS — E785 Hyperlipidemia, unspecified: Secondary | ICD-10-CM | POA: Diagnosis not present

## 2017-08-08 DIAGNOSIS — E039 Hypothyroidism, unspecified: Secondary | ICD-10-CM | POA: Diagnosis not present

## 2017-08-08 NOTE — Patient Instructions (Signed)
   We will recheck MRI of the brain.

## 2017-08-08 NOTE — Telephone Encounter (Signed)
Jillian Fisher: [id number] (exp. 08/08/17 to 09/07/17) order sent to GI. They will reach out to the pt to schedule.

## 2017-08-08 NOTE — Progress Notes (Signed)
Placed [facility] lab in [facility] lock box for routine pick up. 

## 2017-08-08 NOTE — Progress Notes (Signed)
Reason for visit: Multiple sclerosis  Jillian Fisher is an 72 y.o. female  History of present illness:  Jillian Fisher is a 72 year old right-handed white female with a history of multiple sclerosis associated with a chronic gait disorder.  The patient was recently seen in April 2019 with onset of trigeminal neuralgia affecting the right V3 distribution.  The patient was placed on Trileptal, this has improved the pain significantly.  The patient however has had onset of right leg weakness that began around 23 July 2017.  The patient claims at the weakness came on over several days, not suddenly.  The patient denies any numbness associated with this.  The patient was placed on a prednisone Dosepak and has improved with her right leg strength at or near her baseline.  The patient is now back to walking with a walker, she was nonambulatory for some time after onset of the right leg weakness.  She denies any change in bowel bladder function, she has not had any weakness of the arms, she denies any vision changes or slurred speech, or difficulty swallowing.  She denies any headache or dizziness or any visual complaints.  The patient has not had any falls.  She denies any fevers or chills or any evidence of a bladder infection.  She does have a history of hypertension and atrial fibrillation on anticoagulant therapy.  She returns to this office on an urgent basis.   Past Medical History:  Diagnosis Date  . A-fib ([place])   . Abnormality of gait 01/25/2013  . Arthritis   . GERD (gastroesophageal reflux disease)   . Hypercholesteremia   . Hypertension   . Hypothyroidism   . Multiple sclerosis ([place])   . Neurogenic bladder 01/25/2013  . Trigeminal neuralgia 07/07/2017   Right V3    Past Surgical History:  Procedure Laterality Date  . ABDOMINAL HYSTERECTOMY      Family History  Problem Relation Age of Onset  . Heart attack Mother   . Multiple sclerosis Sister   . Heart disease Sister   .  Diabetes Other     Social history:  reports that she has never smoked. She has never used smokeless tobacco. She reports that she does not drink alcohol or use drugs.    Allergies  Allergen Reactions  . Sulfa Antibiotics Rash    Medications:  Prior to Admission medications   Medication Sig Start Date End Date Taking? Authorizing Provider  acetaminophen (TYLENOL) 500 MG tablet Take 1,000 mg by mouth every 6 (six) hours as needed. For pain   Yes [provider]  ALPRAZolam (XANAX) 0.25 MG tablet Take 0.25 mg by mouth daily as needed for anxiety. 12/25/15  Yes [provider]  BETASERON 0.3 MG KIT injection RECONSTITUTE AS DIRECTED PER LEAFLET AND INJECT 0.25MG (1 VIAL) SUBCUTANEOUSLY EVERY OTHER DAY. 04/12/17  Yes Kathrynn Ducking, MD  diltiazem (CARDIZEM CD) 240 MG 24 hr capsule Take 1 capsule (240 mg total) by mouth daily. 02/02/16  Yes Long, Wonda Olds, MD  gabapentin (NEURONTIN) 300 MG capsule Take 1 capsule (300 mg total) by mouth 4 (four) times daily. 07/07/17  Yes Kathrynn Ducking, MD  levothyroxine (SYNTHROID, LEVOTHROID) 125 MCG tablet Take 125 mcg by mouth daily. 12/11/14  Yes [provider]  Multiple Vitamin (MULTIVITAMIN WITH MINERALS) TABS Take 1 tablet by mouth daily.   Yes [provider]  Nutritional Supplements (ESTROVEN PO) Take 1 tablet by mouth daily.   Yes [provider]  omeprazole (PRILOSEC) 20 MG capsule Take 20 mg by mouth daily.   Yes [provider]  OXcarbazepine (TRILEPTAL) 150 MG tablet One tablet twice a day for 1 week, then take 2 tablets twice a day 07/07/17  Yes Kathrynn Ducking, MD  oxybutynin (DITROPAN) 5 MG tablet Take 5 mg by mouth at bedtime.    Yes [provider]  predniSONE (DELTASONE) 10 MG tablet Begin taking 6 tablets daily, taper by one tablet every other day until off the medication. 07/27/17  Yes Kathrynn Ducking, MD  rosuvastatin (CRESTOR) 20 MG tablet Take 10 mg by mouth daily.    Yes  [provider]  apixaban (ELIQUIS) 5 MG TABS tablet Take 1 tablet (5 mg total) by mouth 2 (two) times daily. 02/02/16 04/21/17  Long, Wonda Olds, MD    ROS:  Out of a complete 14 system review of symptoms, the patient complains only of the following symptoms, and all other reviewed systems are negative.  Leg weakness Difficulty walking  Blood pressure (!) 145/78, pulse 75.  Physical Exam  General: The patient is alert and cooperative at the time of the examination.   Respiratory: Lung fields are clear.  Cardiovascular: Regular rate and rhythm, no murmurs or rubs are noted.  Neck: Neck is supple, no carotid bruits are noted.  Skin: No significant peripheral edema is noted.   Neurologic Exam  Mental status: The patient is alert and oriented x 3 at the time of the examination. The patient has apparent normal recent and remote memory, with an apparently normal attention span and concentration ability.   Cranial nerves: Facial symmetry is present. Speech is normal, no aphasia or dysarthria is noted. Extraocular movements are full. Visual fields are full.  Pupils are equal, round, and reactive to light.  Discs are flat bilaterally.  Motor: The patient has good strength in the upper extremities.  With the lower extremities, there is 4/5 strength proximally with the right leg, slight right foot drop.  4+/5 strength is noted on the left lower extremity throughout.  Sensory examination: Soft touch sensation is symmetric on the face, arms, and legs.  Coordination: The patient has good finger-nose-finger and heel-to-shin bilaterally.  Gait and station: The patient is able to stand, she can walk short distance with assistance, gait is wide-based.  Tandem gait was not attempted.  Romberg is negative.  Reflexes: Deep tendon reflexes are symmetric.   Assessment/Plan:  1.  Multiple sclerosis, recent exacerbation  2.  Gait disorder  3.  Right trigeminal neuralgia, V3  distribution  The patient is doing much better with her trigeminal neuralgia.  The patient has had 2 recent events that may be related to her multiple sclerosis with onset of her trigeminal neuralgia and now with right sided lower extremity weakness.  The patient is on Betaseron, she may require a more aggressive therapy at this point.  The patient does have risk factor for stroke with hypertension and atrial fibrillation.  The patient will have a repeat MRI of the brain to exclude stroke.  Blood work will be done today including a urinalysis.  We will consider a switch to Tecfidera.  The patient will follow-up for her next scheduled visit in July 2019.  Jill Alexanders MD 08/08/2017 8:06 AM  [facility] [address] [place], [place] [address]  Phone [phone] Fax [phone]

## 2017-08-09 DIAGNOSIS — G500 Trigeminal neuralgia: Secondary | ICD-10-CM | POA: Diagnosis not present

## 2017-08-09 DIAGNOSIS — M1990 Unspecified osteoarthritis, unspecified site: Secondary | ICD-10-CM | POA: Diagnosis not present

## 2017-08-09 DIAGNOSIS — G35 Multiple sclerosis: Secondary | ICD-10-CM | POA: Diagnosis not present

## 2017-08-09 DIAGNOSIS — I4891 Unspecified atrial fibrillation: Secondary | ICD-10-CM | POA: Diagnosis not present

## 2017-08-09 DIAGNOSIS — E785 Hyperlipidemia, unspecified: Secondary | ICD-10-CM | POA: Diagnosis not present

## 2017-08-09 DIAGNOSIS — N319 Neuromuscular dysfunction of bladder, unspecified: Secondary | ICD-10-CM | POA: Diagnosis not present

## 2017-08-09 DIAGNOSIS — I10 Essential (primary) hypertension: Secondary | ICD-10-CM | POA: Diagnosis not present

## 2017-08-09 DIAGNOSIS — E039 Hypothyroidism, unspecified: Secondary | ICD-10-CM | POA: Diagnosis not present

## 2017-08-09 DIAGNOSIS — K219 Gastro-esophageal reflux disease without esophagitis: Secondary | ICD-10-CM | POA: Diagnosis not present

## 2017-08-09 LAB — URINALYSIS, ROUTINE W REFLEX MICROSCOPIC
Bilirubin, UA: NEGATIVE
Glucose, UA: NEGATIVE
KETONES UA: NEGATIVE
Leukocytes, UA: NEGATIVE
NITRITE UA: NEGATIVE
RBC, UA: NEGATIVE
SPEC GRAV UA: 1.026 (ref 1.005–1.030)
UUROB: 1 mg/dL (ref 0.2–1.0)
pH, UA: 6 (ref 5.0–7.5)

## 2017-08-11 DIAGNOSIS — R739 Hyperglycemia, unspecified: Secondary | ICD-10-CM | POA: Diagnosis not present

## 2017-08-12 ENCOUNTER — Telehealth: Payer: Self-pay | Admitting: Neurology

## 2017-08-12 ENCOUNTER — Telehealth: Payer: Self-pay | Admitting: *Deleted

## 2017-08-12 LAB — COMPREHENSIVE METABOLIC PANEL
ALK PHOS: 49 IU/L (ref 39–117)
ALT: 18 IU/L (ref 0–32)
AST: 11 IU/L (ref 0–40)
Albumin/Globulin Ratio: 1.2 (ref 1.2–2.2)
Albumin: 3.7 g/dL (ref 3.5–4.8)
BUN/Creatinine Ratio: 14 (ref 12–28)
BUN: 15 mg/dL (ref 8–27)
Bilirubin Total: 0.3 mg/dL (ref 0.0–1.2)
CALCIUM: 9.3 mg/dL (ref 8.7–10.3)
CO2: 22 mmol/L (ref 20–29)
CREATININE: 1.1 mg/dL — AB (ref 0.57–1.00)
Chloride: 105 mmol/L (ref 96–106)
GFR, EST AFRICAN AMERICAN: 59 mL/min/{1.73_m2} — AB (ref >59)
GFR, EST NON AFRICAN AMERICAN: 51 mL/min/{1.73_m2} — AB (ref >59)
GLOBULIN, TOTAL: 3 g/dL (ref 1.5–4.5)
Glucose: 126 mg/dL — ABNORMAL HIGH (ref 65–99)
Potassium: 3.9 mmol/L (ref 3.5–5.2)
SODIUM: 148 mmol/L — AB (ref 134–144)
Total Protein: 6.7 g/dL (ref 6.0–8.5)

## 2017-08-12 LAB — HEPATITIS C ANTIBODY

## 2017-08-12 LAB — QUANTIFERON-TB GOLD PLUS
QUANTIFERON MITOGEN VALUE: 7.51 [IU]/mL
QUANTIFERON TB1 AG VALUE: 0.02 [IU]/mL
QUANTIFERON TB2 AG VALUE: 0.02 [IU]/mL
QUANTIFERON-TB GOLD PLUS: NEGATIVE
QuantiFERON Nil Value: 0.02 IU/mL

## 2017-08-12 LAB — CBC WITH DIFFERENTIAL/PLATELET
BASOS: 0 %
Basophils Absolute: 0 10*3/uL (ref 0.0–0.2)
EOS (ABSOLUTE): 0 10*3/uL (ref 0.0–0.4)
EOS: 0 %
HEMATOCRIT: 41.2 % (ref 34.0–46.6)
HEMOGLOBIN: 12.9 g/dL (ref 11.1–15.9)
Immature Grans (Abs): 0 10*3/uL (ref 0.0–0.1)
Immature Granulocytes: 1 %
LYMPHS ABS: 2.2 10*3/uL (ref 0.7–3.1)
Lymphs: 28 %
MCH: 29.1 pg (ref 26.6–33.0)
MCHC: 31.3 g/dL — AB (ref 31.5–35.7)
MCV: 93 fL (ref 79–97)
MONOCYTES: 7 %
MONOS ABS: 0.5 10*3/uL (ref 0.1–0.9)
NEUTROS ABS: 4.9 10*3/uL (ref 1.4–7.0)
Neutrophils: 64 %
Platelets: 298 10*3/uL (ref 150–379)
RBC: 4.43 x10E6/uL (ref 3.77–5.28)
RDW: 14.5 % (ref 12.3–15.4)
WBC: 7.7 10*3/uL (ref 3.4–10.8)

## 2017-08-12 LAB — VARICELLA ZOSTER ANTIBODY, IGG: Varicella zoster IgG: 300 index (ref >165)

## 2017-08-12 LAB — HEPATITIS B SURFACE ANTIBODY,QUALITATIVE: Hep B Surface Ab, Qual: NONREACTIVE

## 2017-08-12 LAB — 10-HYDROXYCARBAZEPINE: Oxcarbazepine SerPl-Mcnc: 18 ug/mL (ref 10–35)

## 2017-08-12 LAB — HEPATITIS B CORE ANTIBODY, TOTAL: HEP B C TOTAL AB: NEGATIVE

## 2017-08-12 NOTE — Telephone Encounter (Signed)
Faxed completed/signed Tecfidera start form to [employer] at [phone]. Received fax confirmation.  

## 2017-08-12 NOTE — Telephone Encounter (Signed)
I called the patient.  The blood work is unremarkable, the patient has a good Trileptal level, she has mild chronic renal insufficiency that is stable.  Sodium level slightly high, even though the patient is on Trileptal.  We will initiate a switch over to Tecfidera.  The patient is amenable to this.

## 2017-08-13 ENCOUNTER — Ambulatory Visit
Admission: RE | Admit: 2017-08-13 | Discharge: 2017-08-13 | Disposition: A | Discharge status: Home / Self Care | Payer: Medicare HMO | Source: Ambulatory Visit | Attending: Neurology | Admitting: Neurology

## 2017-08-13 DIAGNOSIS — R29898 Other symptoms and signs involving the musculoskeletal system: Secondary | ICD-10-CM

## 2017-08-13 DIAGNOSIS — G35 Multiple sclerosis: Secondary | ICD-10-CM | POA: Diagnosis not present

## 2017-08-13 DIAGNOSIS — R269 Unspecified abnormalities of gait and mobility: Secondary | ICD-10-CM

## 2017-08-13 DIAGNOSIS — G500 Trigeminal neuralgia: Secondary | ICD-10-CM

## 2017-08-13 MED ORDER — GADOBENATE DIMEGLUMINE 529 MG/ML IV SOLN
15.0000 mL | Freq: Once | INTRAVENOUS | Status: AC | PRN
  Administered 2017-08-13: 15 mL via INTRAVENOUS

## 2017-08-15 ENCOUNTER — Telehealth: Payer: Self-pay | Admitting: Neurology

## 2017-08-15 NOTE — Telephone Encounter (Signed)
Received fax notification from [employer] that they received start form and processing for pt. 

## 2017-08-15 NOTE — Telephone Encounter (Signed)
I called the patient.  MRI of the brain is unchanged from the one done in February 2019.  The patient claims that her ability to walk is getting back to normal, she will call me if any new issues arise.   MRI brain 08/15/17:  IMPRESSION: This MRI of the brain with and without contrast shows the following: 1.   Multiple T2/FLAIR hyperintense foci in the hemispheres, middle cerebellar peduncle and spinal cord in a pattern and configuration consistent with chronic demyelinating plaque associated with multiple sclerosis.  Compared to the MRI dated 05/21/2017, there has been no new lesions. 2.   The pachymeningeal enhancement noted on the 05/21/2017 MRI has resolved. 3.   There are 2 small microhemorrhages in the left basal ganglia and right thalamus that are likely due to hypertension and are unchanged when compared to the previous MRI.

## 2017-08-15 NOTE — Telephone Encounter (Signed)
[facility] viral antibody panel is positive, index value is 2.41.

## 2017-08-17 ENCOUNTER — Telehealth: Payer: Self-pay | Admitting: Neurology

## 2017-08-17 DIAGNOSIS — G35 Multiple sclerosis: Secondary | ICD-10-CM

## 2017-08-17 MED ORDER — PREDNISONE 10 MG PO TABS: ORAL_TABLET | ORAL | 0 refills | Status: DC

## 2017-08-17 NOTE — Addendum Note (Signed)
Addended by: York Spaniel on: 08/17/2017 04:00 PM   Modules accepted: Orders

## 2017-08-17 NOTE — Telephone Encounter (Signed)
Pt daughter called stating that Monday 5/13 the weakness in the pts leg starting coming back. Pt spent most of the day yesterday in bed but the feeling in her leg is slowly returning this morning. [name] requesting a call back to discuss.

## 2017-08-17 NOTE — Telephone Encounter (Signed)
I called in another prescription for prednisone as the patient has had a relapse with her right leg weakness, she began getting weak over the last 2 or 3 days, no longer able to walk.  The patient will be starting Tecfidera in the near future.

## 2017-08-17 NOTE — Telephone Encounter (Signed)
Jillian Fisher with [facility] [phone] to advise PT appointment was missed today because of weakness in patient's leg as stated in previous message which was left by patient's daughter. PT will resume when patient is feeling better.

## 2017-08-23 ENCOUNTER — Telehealth: Payer: Self-pay | Admitting: *Deleted

## 2017-08-23 NOTE — Telephone Encounter (Signed)
Received fax notification from [employer] that rx Tecfidera shipped by [facility] to pt. Any questions? Phone: [phone], Fax: [phone].

## 2017-08-25 DIAGNOSIS — K219 Gastro-esophageal reflux disease without esophagitis: Secondary | ICD-10-CM | POA: Diagnosis not present

## 2017-08-25 DIAGNOSIS — G35 Multiple sclerosis: Secondary | ICD-10-CM | POA: Diagnosis not present

## 2017-08-25 DIAGNOSIS — I10 Essential (primary) hypertension: Secondary | ICD-10-CM | POA: Diagnosis not present

## 2017-08-25 DIAGNOSIS — N319 Neuromuscular dysfunction of bladder, unspecified: Secondary | ICD-10-CM | POA: Diagnosis not present

## 2017-08-25 DIAGNOSIS — G500 Trigeminal neuralgia: Secondary | ICD-10-CM | POA: Diagnosis not present

## 2017-08-25 DIAGNOSIS — M1990 Unspecified osteoarthritis, unspecified site: Secondary | ICD-10-CM | POA: Diagnosis not present

## 2017-08-25 DIAGNOSIS — E785 Hyperlipidemia, unspecified: Secondary | ICD-10-CM | POA: Diagnosis not present

## 2017-08-25 DIAGNOSIS — I4891 Unspecified atrial fibrillation: Secondary | ICD-10-CM | POA: Diagnosis not present

## 2017-08-25 DIAGNOSIS — E039 Hypothyroidism, unspecified: Secondary | ICD-10-CM | POA: Diagnosis not present

## 2017-08-30 NOTE — Telephone Encounter (Addendum)
Patient's daughter calling stating patient finished  Prednisone this past Sunday which helped but she is having weakness in her right leg and cannot stand up. Her daughter also states patient started Tecfidera last Wednesday. I advised Dr. Anne Hahn is out of the office today and will send message to his nurse.

## 2017-08-31 ENCOUNTER — Telehealth: Payer: Self-pay | Admitting: Neurology

## 2017-08-31 DIAGNOSIS — I351 Nonrheumatic aortic (valve) insufficiency: Secondary | ICD-10-CM | POA: Diagnosis not present

## 2017-08-31 DIAGNOSIS — I10 Essential (primary) hypertension: Secondary | ICD-10-CM | POA: Diagnosis not present

## 2017-08-31 DIAGNOSIS — E039 Hypothyroidism, unspecified: Secondary | ICD-10-CM | POA: Diagnosis not present

## 2017-08-31 DIAGNOSIS — I480 Paroxysmal atrial fibrillation: Secondary | ICD-10-CM | POA: Diagnosis not present

## 2017-08-31 DIAGNOSIS — E785 Hyperlipidemia, unspecified: Secondary | ICD-10-CM | POA: Diagnosis not present

## 2017-08-31 DIAGNOSIS — E7800 Pure hypercholesterolemia, unspecified: Secondary | ICD-10-CM | POA: Diagnosis not present

## 2017-08-31 DIAGNOSIS — G35 Multiple sclerosis: Secondary | ICD-10-CM | POA: Diagnosis not present

## 2017-08-31 DIAGNOSIS — K219 Gastro-esophageal reflux disease without esophagitis: Secondary | ICD-10-CM | POA: Diagnosis not present

## 2017-08-31 DIAGNOSIS — M1990 Unspecified osteoarthritis, unspecified site: Secondary | ICD-10-CM | POA: Diagnosis not present

## 2017-08-31 DIAGNOSIS — I4891 Unspecified atrial fibrillation: Secondary | ICD-10-CM | POA: Diagnosis not present

## 2017-08-31 DIAGNOSIS — G500 Trigeminal neuralgia: Secondary | ICD-10-CM | POA: Diagnosis not present

## 2017-08-31 DIAGNOSIS — N319 Neuromuscular dysfunction of bladder, unspecified: Secondary | ICD-10-CM | POA: Diagnosis not present

## 2017-08-31 NOTE — Telephone Encounter (Signed)
I called Amber, a verbal order was given for physical therapy.

## 2017-08-31 NOTE — Telephone Encounter (Signed)
Amber/[facility] [phone] called request PT for 1 x 4 for strengthing. Please call to advise

## 2017-09-01 DIAGNOSIS — R0602 Shortness of breath: Secondary | ICD-10-CM | POA: Diagnosis not present

## 2017-09-01 NOTE — Telephone Encounter (Signed)
I called the patient, talk with the daughter.  The patient was given a course of prednisone but only had a few days of benefit while on the medication.  The patient has [facility] to [facility] house.  I will have the nurse through home health care give the patient a 5-day course of Solu-Medrol taking 500 mg daily.

## 2017-09-01 NOTE — Telephone Encounter (Signed)
Called [facility] and spoke with Onalee Hua. He transferred me to triage nurse, Herbert Seta. She transferred me to field nurse to take VO. I spoke with Jon Gills. I gave VO for IV solumedrol 500mg  dailyx5days. She will try and have pt start tomorrow.  I advised I will not be in the office tomorrow but if they have any questions. Baxter Hire, RN will be here tomorrow. She verbalized understanding.

## 2017-09-01 NOTE — Telephone Encounter (Signed)
Pts daughter called wanting to inform Dr. Anne Hahn that as of right now the pts leg weakness hasn't gotten better. Requesting a call back to discuss further options.

## 2017-09-02 ENCOUNTER — Emergency Department (HOSPITAL_COMMUNITY): Payer: Medicare HMO

## 2017-09-02 ENCOUNTER — Telehealth: Payer: Self-pay | Admitting: Neurology

## 2017-09-02 ENCOUNTER — Inpatient Hospital Stay (HOSPITAL_COMMUNITY)
Admission: EM | Admit: 2017-09-02 | Discharge: 2017-09-08 | DRG: 682 | Disposition: A | Discharge status: Skilled Nursing Facility | Payer: Medicare HMO | Attending: Internal Medicine | Admitting: Internal Medicine

## 2017-09-02 ENCOUNTER — Encounter (HOSPITAL_COMMUNITY): Payer: Self-pay

## 2017-09-02 ENCOUNTER — Other Ambulatory Visit: Payer: Self-pay

## 2017-09-02 DIAGNOSIS — N319 Neuromuscular dysfunction of bladder, unspecified: Secondary | ICD-10-CM | POA: Diagnosis present

## 2017-09-02 DIAGNOSIS — R7989 Other specified abnormal findings of blood chemistry: Secondary | ICD-10-CM | POA: Diagnosis not present

## 2017-09-02 DIAGNOSIS — R41841 Cognitive communication deficit: Secondary | ICD-10-CM | POA: Diagnosis not present

## 2017-09-02 DIAGNOSIS — R5381 Other malaise: Secondary | ICD-10-CM | POA: Diagnosis not present

## 2017-09-02 DIAGNOSIS — E039 Hypothyroidism, unspecified: Secondary | ICD-10-CM | POA: Diagnosis not present

## 2017-09-02 DIAGNOSIS — G35 Multiple sclerosis: Secondary | ICD-10-CM | POA: Diagnosis not present

## 2017-09-02 DIAGNOSIS — R410 Disorientation, unspecified: Secondary | ICD-10-CM | POA: Diagnosis not present

## 2017-09-02 DIAGNOSIS — I480 Paroxysmal atrial fibrillation: Secondary | ICD-10-CM | POA: Diagnosis present

## 2017-09-02 DIAGNOSIS — N1330 Unspecified hydronephrosis: Secondary | ICD-10-CM | POA: Diagnosis present

## 2017-09-02 DIAGNOSIS — G500 Trigeminal neuralgia: Secondary | ICD-10-CM | POA: Diagnosis present

## 2017-09-02 DIAGNOSIS — M6281 Muscle weakness (generalized): Secondary | ICD-10-CM | POA: Diagnosis not present

## 2017-09-02 DIAGNOSIS — R402413 Glasgow coma scale score 13-15, at hospital admission: Secondary | ICD-10-CM | POA: Diagnosis present

## 2017-09-02 DIAGNOSIS — E860 Dehydration: Secondary | ICD-10-CM | POA: Diagnosis present

## 2017-09-02 DIAGNOSIS — G9341 Metabolic encephalopathy: Secondary | ICD-10-CM | POA: Diagnosis present

## 2017-09-02 DIAGNOSIS — E876 Hypokalemia: Secondary | ICD-10-CM | POA: Diagnosis present

## 2017-09-02 DIAGNOSIS — Z79899 Other long term (current) drug therapy: Secondary | ICD-10-CM

## 2017-09-02 DIAGNOSIS — R0902 Hypoxemia: Secondary | ICD-10-CM | POA: Diagnosis not present

## 2017-09-02 DIAGNOSIS — I160 Hypertensive urgency: Secondary | ICD-10-CM | POA: Diagnosis not present

## 2017-09-02 DIAGNOSIS — K219 Gastro-esophageal reflux disease without esophagitis: Secondary | ICD-10-CM | POA: Diagnosis present

## 2017-09-02 DIAGNOSIS — B349 Viral infection, unspecified: Secondary | ICD-10-CM | POA: Diagnosis present

## 2017-09-02 DIAGNOSIS — A419 Sepsis, unspecified organism: Secondary | ICD-10-CM | POA: Diagnosis not present

## 2017-09-02 DIAGNOSIS — M1990 Unspecified osteoarthritis, unspecified site: Secondary | ICD-10-CM | POA: Diagnosis present

## 2017-09-02 DIAGNOSIS — I4891 Unspecified atrial fibrillation: Secondary | ICD-10-CM | POA: Diagnosis not present

## 2017-09-02 DIAGNOSIS — N179 Acute kidney failure, unspecified: Principal | ICD-10-CM | POA: Diagnosis present

## 2017-09-02 DIAGNOSIS — M8589 Other specified disorders of bone density and structure, multiple sites: Secondary | ICD-10-CM | POA: Diagnosis not present

## 2017-09-02 DIAGNOSIS — G9340 Encephalopathy, unspecified: Secondary | ICD-10-CM | POA: Diagnosis not present

## 2017-09-02 DIAGNOSIS — I10 Essential (primary) hypertension: Secondary | ICD-10-CM | POA: Diagnosis present

## 2017-09-02 DIAGNOSIS — D72829 Elevated white blood cell count, unspecified: Secondary | ICD-10-CM | POA: Diagnosis present

## 2017-09-02 DIAGNOSIS — G92 Toxic encephalopathy: Secondary | ICD-10-CM | POA: Diagnosis present

## 2017-09-02 DIAGNOSIS — M4802 Spinal stenosis, cervical region: Secondary | ICD-10-CM | POA: Diagnosis not present

## 2017-09-02 DIAGNOSIS — E785 Hyperlipidemia, unspecified: Secondary | ICD-10-CM | POA: Diagnosis present

## 2017-09-02 DIAGNOSIS — Z79890 Hormone replacement therapy (postmenopausal): Secondary | ICD-10-CM

## 2017-09-02 DIAGNOSIS — R0602 Shortness of breath: Secondary | ICD-10-CM | POA: Diagnosis not present

## 2017-09-02 DIAGNOSIS — R6511 Systemic inflammatory response syndrome (SIRS) of non-infectious origin with acute organ dysfunction: Secondary | ICD-10-CM | POA: Diagnosis not present

## 2017-09-02 DIAGNOSIS — R1312 Dysphagia, oropharyngeal phase: Secondary | ICD-10-CM | POA: Diagnosis not present

## 2017-09-02 DIAGNOSIS — R509 Fever, unspecified: Secondary | ICD-10-CM | POA: Diagnosis not present

## 2017-09-02 DIAGNOSIS — R001 Bradycardia, unspecified: Secondary | ICD-10-CM | POA: Diagnosis not present

## 2017-09-02 DIAGNOSIS — R6510 Systemic inflammatory response syndrome (SIRS) of non-infectious origin without acute organ dysfunction: Secondary | ICD-10-CM | POA: Diagnosis present

## 2017-09-02 DIAGNOSIS — Z882 Allergy status to sulfonamides status: Secondary | ICD-10-CM

## 2017-09-02 DIAGNOSIS — Z7901 Long term (current) use of anticoagulants: Secondary | ICD-10-CM

## 2017-09-02 DIAGNOSIS — R531 Weakness: Secondary | ICD-10-CM | POA: Diagnosis not present

## 2017-09-02 DIAGNOSIS — R4020 Unspecified coma: Secondary | ICD-10-CM | POA: Diagnosis not present

## 2017-09-02 DIAGNOSIS — N132 Hydronephrosis with renal and ureteral calculous obstruction: Secondary | ICD-10-CM | POA: Diagnosis not present

## 2017-09-02 DIAGNOSIS — J9811 Atelectasis: Secondary | ICD-10-CM | POA: Diagnosis present

## 2017-09-02 DIAGNOSIS — Z87440 Personal history of urinary (tract) infections: Secondary | ICD-10-CM

## 2017-09-02 DIAGNOSIS — M5124 Other intervertebral disc displacement, thoracic region: Secondary | ICD-10-CM | POA: Diagnosis not present

## 2017-09-02 DIAGNOSIS — J90 Pleural effusion, not elsewhere classified: Secondary | ICD-10-CM | POA: Diagnosis not present

## 2017-09-02 DIAGNOSIS — E872 Acidosis: Secondary | ICD-10-CM | POA: Diagnosis not present

## 2017-09-02 LAB — CBC WITH DIFFERENTIAL/PLATELET
BASOS ABS: 0 10*3/uL (ref 0.0–0.1)
BASOS PCT: 0 %
Eosinophils Absolute: 0.1 10*3/uL (ref 0.0–0.7)
Eosinophils Relative: 0 %
HEMATOCRIT: 35.6 % — AB (ref 36.0–46.0)
HEMOGLOBIN: 11.4 g/dL — AB (ref 12.0–15.0)
LYMPHS PCT: 9 %
Lymphs Abs: 1.9 10*3/uL (ref 0.7–4.0)
MCH: 29.7 pg (ref 26.0–34.0)
MCHC: 32 g/dL (ref 30.0–36.0)
MCV: 92.7 fL (ref 78.0–100.0)
MONOS PCT: 8 %
Monocytes Absolute: 1.5 10*3/uL — ABNORMAL HIGH (ref 0.1–1.0)
NEUTROS PCT: 83 %
Neutro Abs: 16.3 10*3/uL — ABNORMAL HIGH (ref 1.7–7.7)
Platelets: 248 10*3/uL (ref 150–400)
RBC: 3.84 MIL/uL — ABNORMAL LOW (ref 3.87–5.11)
RDW: 14.5 % (ref 11.5–15.5)
WBC: 19.7 10*3/uL — ABNORMAL HIGH (ref 4.0–10.5)

## 2017-09-02 LAB — COMPREHENSIVE METABOLIC PANEL
ALBUMIN: 2.8 g/dL — AB (ref 3.5–5.0)
ALK PHOS: 52 U/L (ref 38–126)
ALT: 37 U/L (ref 14–54)
ANION GAP: 11 (ref 5–15)
AST: 57 U/L — ABNORMAL HIGH (ref 15–41)
BUN: 80 mg/dL — ABNORMAL HIGH (ref 6–20)
CALCIUM: 8.6 mg/dL — AB (ref 8.9–10.3)
CO2: 26 mmol/L (ref 22–32)
Chloride: 105 mmol/L (ref 101–111)
Creatinine, Ser: 3.22 mg/dL — ABNORMAL HIGH (ref 0.44–1.00)
GFR calc non Af Amer: 13 mL/min — ABNORMAL LOW (ref >60)
GFR, EST AFRICAN AMERICAN: 16 mL/min — AB (ref >60)
GLUCOSE: 161 mg/dL — AB (ref 65–99)
POTASSIUM: 4.2 mmol/L (ref 3.5–5.1)
SODIUM: 142 mmol/L (ref 135–145)
Total Bilirubin: 0.5 mg/dL (ref 0.3–1.2)
Total Protein: 6.4 g/dL — ABNORMAL LOW (ref 6.5–8.1)

## 2017-09-02 LAB — URINALYSIS, ROUTINE W REFLEX MICROSCOPIC
Bilirubin Urine: NEGATIVE
GLUCOSE, UA: NEGATIVE mg/dL
Hgb urine dipstick: NEGATIVE
Ketones, ur: NEGATIVE mg/dL
LEUKOCYTES UA: NEGATIVE
NITRITE: NEGATIVE
PH: 6 (ref 5.0–8.0)
Protein, ur: NEGATIVE mg/dL
SPECIFIC GRAVITY, URINE: 1.01 (ref 1.005–1.030)

## 2017-09-02 LAB — BLOOD GAS, VENOUS
ACID-BASE DEFICIT: 0.3 mmol/L (ref 0.0–2.0)
Bicarbonate: 24.2 mmol/L (ref 20.0–28.0)
FIO2: 21
O2 Saturation: 50.2 %
PCO2 VEN: 41.7 mmHg — AB (ref 44.0–60.0)
PH VEN: 7.382 (ref 7.250–7.430)
Patient temperature: 98.6
pO2, Ven: 31.1 mmHg — CL (ref 32.0–45.0)

## 2017-09-02 LAB — I-STAT CG4 LACTIC ACID, ED
Lactic Acid, Venous: 2.02 mmol/L (ref 0.5–1.9)
Lactic Acid, Venous: 3.26 mmol/L (ref 0.5–1.9)

## 2017-09-02 MED ORDER — VANCOMYCIN HCL IN DEXTROSE 1-5 GM/200ML-% IV SOLN
1000.0000 mg | Freq: Once | INTRAVENOUS | Status: AC
  Administered 2017-09-02: 1000 mg via INTRAVENOUS
  Filled 2017-09-02: qty 200

## 2017-09-02 MED ORDER — SODIUM CHLORIDE 0.9 % IV BOLUS (SEPSIS)
1000.0000 mL | Freq: Once | INTRAVENOUS | Status: AC
  Administered 2017-09-02: 1000 mL via INTRAVENOUS

## 2017-09-02 MED ORDER — PIPERACILLIN-TAZOBACTAM IN DEX 2-0.25 GM/50ML IV SOLN
2.2500 g | Freq: Three times a day (TID) | INTRAVENOUS | Status: DC
Start: 2017-09-03 — End: 2017-09-03
  Administered 2017-09-03: 2.25 g via INTRAVENOUS
  Filled 2017-09-02 (×2): qty 50

## 2017-09-02 MED ORDER — SODIUM CHLORIDE 0.9 % IV BOLUS (SEPSIS)
1000.0000 mL | Freq: Once | INTRAVENOUS | Status: AC
Start: 2017-09-02 — End: 2017-09-02
  Administered 2017-09-02: 1000 mL via INTRAVENOUS

## 2017-09-02 MED ORDER — VANCOMYCIN HCL 500 MG IV SOLR
500.0000 mg | INTRAVENOUS | Status: DC
  Filled 2017-09-02: qty 500

## 2017-09-02 MED ORDER — PIPERACILLIN-TAZOBACTAM IN DEX 2-0.25 GM/50ML IV SOLN
2.2500 g | INTRAVENOUS | Status: DC
  Filled 2017-09-02: qty 50

## 2017-09-02 MED ORDER — PIPERACILLIN-TAZOBACTAM 3.375 G IVPB 30 MIN
3.3750 g | Freq: Once | INTRAVENOUS | Status: DC
  Filled 2017-09-02: qty 50

## 2017-09-02 MED ORDER — SODIUM CHLORIDE 0.9 % IV SOLN
1000.0000 mL | INTRAVENOUS | Status: DC
  Administered 2017-09-03: 1000 mL via INTRAVENOUS

## 2017-09-02 MED ORDER — PIPERACILLIN-TAZOBACTAM IN DEX 2-0.25 GM/50ML IV SOLN
2.2500 g | INTRAVENOUS | Status: AC
  Administered 2017-09-02: 2.25 g via INTRAVENOUS
  Filled 2017-09-02: qty 50

## 2017-09-02 MED ORDER — SODIUM CHLORIDE 0.9 % IV SOLN
1000.0000 mL | INTRAVENOUS | Status: DC
  Administered 2017-09-02: 1000 mL via INTRAVENOUS

## 2017-09-02 MED ORDER — SODIUM CHLORIDE 0.9 % IV BOLUS (SEPSIS)
500.0000 mL | Freq: Once | INTRAVENOUS | Status: AC
Start: 2017-09-02 — End: 2017-09-02
  Administered 2017-09-02: 500 mL via INTRAVENOUS

## 2017-09-02 NOTE — H&P (Signed)
Jillian Fisher:[id number] DOB: 1945-10-25 DOA: 09/02/2017     PCP: Clovis Riley, L.August Saucer, MD   Outpatient Specialists:   CARDS:  Dr. Jacinto Halim [facility]    Dr. Frances Furbish, and Dr.  Anne Hahn   Patient arrived to ER on 09/02/17 at 1625  Patient coming from:  home Lives With family   Chief Complaint:  Chief Complaint  Patient presents with  . Fever    HPI: Jillian Fisher is a 72 y.o. female with medical history significant of Multiple sclerosis, Neurogenic bladder, a.fib, reccurent UTI, HLD,HTN, Hypothyroidism    Presented with  Lethargy for the past 4-5 days with low grade fevers. She was scheduled to get IV solumedrol at neurology office and instead was sent to ER. EMS was called they gave pt NS.  Family denies any cough, no complaints, were taken to cardiology office this week noted to have tachycardia and was started on metoprolol.  Recently started on Tecfidera by neurology but she was already a bit drowsy before than.      Regarding pertinent Chronic problems: hx of A.fib on Eliquis and Cardizem,  While in ER: Meeting SIRS criteria but no source. Started empiric antibiotics. Vanc and zosyn  Following Medications were ordered in ER: Medications  0.9 %  sodium chloride infusion (1,000 mLs Intravenous New Bag/Given 09/02/17 1807)  vancomycin (VANCOCIN) IVPB 1000 mg/200 mL premix (1,000 mg Intravenous New Bag/Given 09/02/17 1740)  piperacillin-tazobactam (ZOSYN) IVPB 2.25 g (0 g Intravenous Stopped 09/02/17 1808)  sodium chloride 0.9 % bolus 1,000 mL (1,000 mLs Intravenous New Bag/Given 09/02/17 1810)    And  sodium chloride 0.9 % bolus 1,000 mL (1,000 mLs Intravenous New Bag/Given 09/02/17 1808)    And  sodium chloride 0.9 % bolus 500 mL (500 mLs Intravenous New Bag/Given 09/02/17 1815)    Significant initial  Findings: Abnormal Labs Reviewed  COMPREHENSIVE METABOLIC PANEL - Abnormal; Notable for the following components:      Result Value   Glucose, Bld 161 (*)    BUN  80 (*)    Creatinine, Ser 3.22 (*)    Calcium 8.6 (*)    Total Protein 6.4 (*)    Albumin 2.8 (*)    AST 57 (*)    GFR calc non Af Amer 13 (*)    GFR calc Af Amer 16 (*)    All other components within normal limits  CBC WITH DIFFERENTIAL/PLATELET - Abnormal; Notable for the following components:   WBC 19.7 (*)    RBC 3.84 (*)    Hemoglobin 11.4 (*)    HCT 35.6 (*)    Neutro Abs 16.3 (*)    Monocytes Absolute 1.5 (*)    All other components within normal limits  I-STAT CG4 LACTIC ACID, ED - Abnormal; Notable for the following components:   Lactic Acid, Venous 2.02 (*)    All other components within normal limits     Na 142 K 4.2  Cr   Up from baseline see below Lab Results  Component Value Date   CREATININE 3.22 (H) 09/02/2017   CREATININE 1.10 (H) 08/08/2017   CREATININE 1.21 (H) 04/21/2017      WBC 19.7  HG/HCT   stable,      Component Value Date/Time   HGB 11.4 (L) 09/02/2017 1644   HGB 12.9 08/08/2017 0808   HCT 35.6 (L) 09/02/2017 1644   HCT 41.2 08/08/2017 0808   Lactic Acid, Venous    Component Value Date/Time   LATICACIDVEN 2.02 (HH)  09/02/2017 1656      UA  no evidence of UTI   CT HEAD  NON acute  CXR - NON acute   ECG:  Personally reviewed by me showing: HR : 75 Rhythm:  NSR   nonspecific changes,  QTC 421      ED Triage Vitals  Enc Vitals Group     BP 09/02/17 1637 120/76     Pulse Rate 09/02/17 1637 76     Resp 09/02/17 1637 20     Temp 09/02/17 1637 98.4 F (36.9 C)     Temp Source 09/02/17 1637 Oral     SpO2 09/02/17 1631 95 %     Weight 09/02/17 1635 175 lb (79.4 kg)     Height 09/02/17 1635 5\' 4"  (1.626 m)     Head Circumference --      Peak Flow --      Pain Score 09/02/17 1635 10     Pain Loc --      Pain Edu? --      Excl. in GC? --   TMAX(24)@       Latest  Blood pressure (!) 126/106, pulse 83, temperature (!) 101.1 F (38.4 C), temperature source Rectal, resp. rate (!) 29, height 5\' 4"  (1.626 m), weight 79.4 kg  (175 lb), SpO2 99 %.   Hospitalist was called for admission for AKI secondary to dehydration and sepsis   Review of Systems:    Pertinent positives include:  Fevers, chills, fatigue, lethargy  Constitutional:  No weight loss, night sweats, weight loss  HEENT:  No headaches, Difficulty swallowing,Tooth/dental problems,Sore throat,  No sneezing, itching, ear ache, nasal congestion, post nasal drip,  Cardio-vascular:  No chest pain, Orthopnea, PND, anasarca, dizziness, palpitations.no Bilateral lower extremity swelling  GI:  No heartburn, indigestion, abdominal pain, nausea, vomiting, diarrhea, change in bowel habits, loss of appetite, melena, blood in stool, hematemesis Resp:  no shortness of breath at rest. No dyspnea on exertion, No excess mucus, no productive cough, No non-productive cough, No coughing up of blood.No change in color of mucus.No wheezing. Skin:  no rash or lesions. No jaundice GU:  no dysuria, change in color of urine, no urgency or frequency. No straining to urinate.  No flank pain.  Musculoskeletal:  No joint pain or no joint swelling. No decreased range of motion. No back pain.  Psych:  No change in mood or affect. No depression or anxiety. No memory loss.  Neuro: no localizing neurological complaints, no tingling, no weakness, no double vision, no gait abnormality, no slurred speech   As per HPI otherwise 10 point review of systems negative.   Past Medical History:   Past Medical History:  Diagnosis Date  . A-fib (HCC)   . Abnormality of gait 01/25/2013  . Arthritis   . GERD (gastroesophageal reflux disease)   . Hypercholesteremia   . Hypertension   . Hypothyroidism   . Multiple sclerosis (HCC)   . Neurogenic bladder 01/25/2013  . Trigeminal neuralgia 07/07/2017   Right V3      Past Surgical History:  Procedure Laterality Date  . ABDOMINAL HYSTERECTOMY      Social History:  Ambulatory  Independently     reports that she has never smoked.  She has never used smokeless tobacco. She reports that she does not drink alcohol or use drugs.     Family History:   Family History  Problem Relation Age of Onset  . Heart attack Mother   . Multiple sclerosis  Sister   . Heart disease Sister   . Diabetes Other     Allergies: Allergies  Allergen Reactions  . Sulfa Antibiotics Rash     Prior to Admission medications   Medication Sig Start Date End Date Taking? Authorizing Provider  acetaminophen (TYLENOL) 500 MG tablet Take 1,000 mg by mouth every 6 (six) hours as needed. For pain    [provider]  ALPRAZolam (XANAX) 0.25 MG tablet Take 0.25 mg by mouth daily as needed for anxiety. 12/25/15   [provider]  apixaban (ELIQUIS) 5 MG TABS tablet Take 1 tablet (5 mg total) by mouth 2 (two) times daily. 02/02/16 04/21/17  Long, Arlyss Repress, MD  diltiazem (CARDIZEM CD) 240 MG 24 hr capsule Take 1 capsule (240 mg total) by mouth daily. 02/02/16   Long, Arlyss Repress, MD  Dimethyl Fumarate (TECFIDERA) 120 & 240 MG MISC Take 1 Dose by mouth 2 (two) times daily.    [provider]  gabapentin (NEURONTIN) 300 MG capsule Take 1 capsule (300 mg total) by mouth 4 (four) times daily. 07/07/17   York Spaniel, MD  levothyroxine (SYNTHROID, LEVOTHROID) 125 MCG tablet Take 125 mcg by mouth daily. 12/11/14   [provider]  Multiple Vitamin (MULTIVITAMIN WITH MINERALS) TABS Take 1 tablet by mouth daily.    [provider]  Nutritional Supplements (ESTROVEN PO) Take 1 tablet by mouth daily.    [provider]  omeprazole (PRILOSEC) 20 MG capsule Take 20 mg by mouth daily.    [provider]  OXcarbazepine (TRILEPTAL) 150 MG tablet One tablet twice a day for 1 week, then take 2 tablets twice a day 07/07/17   York Spaniel, MD  oxybutynin (DITROPAN) 5 MG tablet Take 5 mg by mouth at bedtime.     [provider]  predniSONE (DELTASONE) 10 MG tablet Begin taking 6 tablets daily, taper by  one tablet every other day until off the medication. 08/17/17   York Spaniel, MD  rosuvastatin (CRESTOR) 20 MG tablet Take 10 mg by mouth daily.     [provider]   Physical Exam: Blood pressure (!) 126/106, pulse 83, temperature (!) 101.1 F (38.4 C), temperature source Rectal, resp. rate (!) 29, height 5\' 4"  (1.626 m), weight 79.4 kg (175 lb), SpO2 99 %. 1. General:  in No Acute distress   Chronically ill   -appearing 2. Psychological: lethargic but following comands and   Oriented to self 3. Head/ENT:     Dry Mucous Membranes                          Head Non traumatic, neck supple                            Poor Dentition 4. SKIN: decreased Skin turgor,  Skin clean Dry and intact no rash 5. Heart: Regular rate and rhythm no  Murmur, no Rub or gallop 6. Lungs:  no wheezes or crackles   7. Abdomen: Soft,  non-tender, Non distended   Obese bowel sounds present 8. Lower extremities: no clubbing, cyanosis, or edema 9. Neurologically Grossly intact, moving all 4 extremities equally   10. MSK: Normal range of motion   LABS:     Recent Labs  Lab 09/02/17 1644  WBC 19.7*  NEUTROABS 16.3*  HGB 11.4*  HCT 35.6*  MCV 92.7  PLT 248   Basic Metabolic  Panel: Recent Labs  Lab 09/02/17 1644  NA 142  K 4.2  CL 105  CO2 26  GLUCOSE 161*  BUN 80*  CREATININE 3.22*  CALCIUM 8.6*      Recent Labs  Lab 09/02/17 1644  AST 57*  ALT 37  ALKPHOS 52  BILITOT 0.5  PROT 6.4*  ALBUMIN 2.8*   No results for input(s): LIPASE, AMYLASE in the last 168 hours. No results for input(s): AMMONIA in the last 168 hours.    HbA1C: No results for input(s): HGBA1C in the last 72 hours. CBG: No results for input(s): GLUCAP in the last 168 hours.    Urine analysis:    Component Value Date/Time   COLORURINE YELLOW 09/02/2017 1650   APPEARANCEUR CLEAR 09/02/2017 1650   APPEARANCEUR Clear 08/08/2017 0820   LABSPEC 1.010 09/02/2017 1650   PHURINE 6.0 09/02/2017 1650    GLUCOSEU NEGATIVE 09/02/2017 1650   HGBUR NEGATIVE 09/02/2017 1650   BILIRUBINUR NEGATIVE 09/02/2017 1650   BILIRUBINUR Negative 08/08/2017 0820   KETONESUR NEGATIVE 09/02/2017 1650   PROTEINUR NEGATIVE 09/02/2017 1650   NITRITE NEGATIVE 09/02/2017 1650   LEUKOCYTESUR NEGATIVE 09/02/2017 1650   LEUKOCYTESUR Negative 08/08/2017 0820       Cultures:    Component Value Date/Time   SDES URINE, RANDOM 02/24/2016 1529   SPECREQUEST NONE 02/24/2016 1529   CULT MULTIPLE SPECIES PRESENT, SUGGEST RECOLLECTION (A) 02/24/2016 1529   REPTSTATUS 02/26/2016 FINAL 02/24/2016 1529     Radiological Exams on Admission: Dg Chest 2 View  Result Date: 09/02/2017 CLINICAL DATA:  Shortness of breath EXAM: CHEST - 2 VIEW COMPARISON:  02/02/2016 FINDINGS: Cardiac shadow is mildly enlarged. The lungs are well aerated bilaterally. No focal infiltrate or sizable effusion is seen. IMPRESSION: No acute abnormality noted. Electronically Signed   By: Alcide Clever M.D.   On: 09/02/2017 17:34   Ct Head Wo Contrast  Result Date: 09/02/2017 CLINICAL DATA:  Altered level of consciousness. Fever. Lethargy. History of multiple sclerosis. EXAM: CT HEAD WITHOUT CONTRAST TECHNIQUE: Contiguous axial images were obtained from the base of the skull through the vertex without intravenous contrast. COMPARISON:  Brain MRI 08/13/2017 FINDINGS: Brain: There is no evidence of acute infarct, intracranial hemorrhage, mass, midline shift, or extra-axial fluid collection. The ventricles and sulci are within normal limits for age. Extensive patchy to confluent hypodensity is present throughout the cerebral white matter. Vascular: No hyperdense vessel. Skull: No fracture or focal osseous lesion. Sinuses/Orbits: Paranasal sinuses and mastoid air cells are clear. Unremarkable orbits. Other: None. IMPRESSION: 1. No evidence of acute intracranial abnormality. 2. Extensive white matter disease consistent with known multiple sclerosis. Electronically  Signed   By: Sebastian Ache M.D.   On: 09/02/2017 17:37    Chart has been reviewed    Assessment/Plan   72 y.o. female with medical history significant of Multiple sclerosis, Neurogenic bladder, a.fib, reccurent UTI, HLD,HTN, Hypothyroidism Admitted for  AKI secondary to dehydration and associated sepsis altered mental status of lethargy   Present on Admission: Acute encephalopathy -   - most likely multifactorial secondary to combination of infection mild dehydration secondary to decreased by mouth intake, polypharmacy   - Will rehydrate   - treat underlining infection with an unknown source for now we will order blood cultures and treat with broad-spectrum antibiotics  - Hold contributing medications including Trileptal and Neurontin  - if no improvement may need further imaging to evaluate for CNS pathology-put in order for MRI in a.m. appreciate neurology consult Will order VBG .  Sepsis (HCC) Londell Moh -await results of blood cultures continue broad-spectrum antibiotics serial lactic acid admit to stepdown given altered mental status and worsening lactic acid. UA unremarkable,    urine culture and blood cultures ordered chest x-ray unremarkable.  No nausea vomiting diarrhea no abdominal complaints abdomen soft nontender but if persistent febrile illness can consider further imaging. Neck is supple patient has been progressively lethargic for past week or so not consistent with bacterial meningitis. . Elevated lactic acid level will rehydrate and follow . AKI (acute kidney injury) -  (HCC) we will rehydrate follow kidney function . Dehydration rehydrate with IV fluids . Multiple sclerosis (HCC) chronic hold steroids and TECFIDERA . PAF (paroxysmal atrial fibrillation) (HCC) -         - CHA2DS2 vas score 2 : continue current anticoagulation with  Eliquis,         -  Rate control:  Currently controlled with  Diltiazem will continue  Other plan as per orders.  DVT prophylaxis: Eliquis     Code Status:  FULL CODE   as per family  I had personally discussed CODE STATUS with patient with family    Family Communication:   Family  at  Bedside  plan of care was discussed with Son, Daughter  Disposition Plan:     To home once workup is complete and patient is stable                         Would benefit from PT/OT eval prior to DC ordered                                              Consults called: Neurology aware5  Admission status:    inpatient      Level of care      SDU        Kadarius Cuffe 09/02/2017, 10:50 PM    [facility]  Pager [phone]   after 2 AM please page floor coverage PA If 7AM-7PM, please contact the day team taking care of the patient  [email or web address]  Password [email or web address]

## 2017-09-02 NOTE — ED Provider Notes (Signed)
[facility]-EMERGENCY DEPT Provider Note   CSN: [id number] Arrival date & time: 09/02/17  1625     History   Chief Complaint Chief Complaint  Patient presents with  . Fever    HPI Jillian Fisher is a 72 y.o. female.  72 year old female presents with altered mental status x1 day.  Family has reportedly noted that she has been more weak today.  Does have a history of UTI and they are concerned that this may have recurred.  Patient herself denies any somatic pain.  No headache or neck stiffness.  No abdominal or chest discomfort.  No recent cough or congestion.  Denies any dysuria or hematuria.  Temperature at home was reported to be 100 degrees and EMS was called and patient given 500 cc of fluid and transported here.     Past Medical History:  Diagnosis Date  . A-fib (HCC)   . Abnormality of gait 01/25/2013  . Arthritis   . GERD (gastroesophageal reflux disease)   . Hypercholesteremia   . Hypertension   . Hypothyroidism   . Multiple sclerosis (HCC)   . Neurogenic bladder 01/25/2013  . Trigeminal neuralgia 07/07/2017   Right V3    Patient Active Problem List   Diagnosis Date Noted  . Trigeminal neuralgia 07/07/2017  . Right leg weakness 02/25/2016  . Lower urinary tract infectious disease 02/25/2016  . PAF (paroxysmal atrial fibrillation) (HCC) 02/25/2016  . Weakness of right lower extremity 02/25/2016  . Gait instability 02/24/2016  . Multiple sclerosis (HCC) 01/25/2013  . Abnormality of gait 01/25/2013  . Neurogenic bladder 01/25/2013    Past Surgical History:  Procedure Laterality Date  . ABDOMINAL HYSTERECTOMY       OB History   None      Home Medications    Prior to Admission medications   Medication Sig Start Date End Date Taking? Authorizing Provider  acetaminophen (TYLENOL) 500 MG tablet Take 1,000 mg by mouth every 6 (six) hours as needed. For pain    [provider]  ALPRAZolam (XANAX) 0.25 MG tablet Take 0.25  mg by mouth daily as needed for anxiety. 12/25/15   [provider]  apixaban (ELIQUIS) 5 MG TABS tablet Take 1 tablet (5 mg total) by mouth 2 (two) times daily. 02/02/16 04/21/17  Long, Arlyss Repress, MD  diltiazem (CARDIZEM CD) 240 MG 24 hr capsule Take 1 capsule (240 mg total) by mouth daily. 02/02/16   Long, Arlyss Repress, MD  Dimethyl Fumarate (TECFIDERA) 120 & 240 MG MISC Take 1 Dose by mouth 2 (two) times daily.    [provider]  gabapentin (NEURONTIN) 300 MG capsule Take 1 capsule (300 mg total) by mouth 4 (four) times daily. 07/07/17   York Spaniel, MD  levothyroxine (SYNTHROID, LEVOTHROID) 125 MCG tablet Take 125 mcg by mouth daily. 12/11/14   [provider]  Multiple Vitamin (MULTIVITAMIN WITH MINERALS) TABS Take 1 tablet by mouth daily.    [provider]  Nutritional Supplements (ESTROVEN PO) Take 1 tablet by mouth daily.    [provider]  omeprazole (PRILOSEC) 20 MG capsule Take 20 mg by mouth daily.    [provider]  OXcarbazepine (TRILEPTAL) 150 MG tablet One tablet twice a day for 1 week, then take 2 tablets twice a day 07/07/17   York Spaniel, MD  oxybutynin (DITROPAN) 5 MG tablet Take 5 mg by mouth at bedtime.     [provider]  predniSONE (DELTASONE) 10 MG tablet Begin  taking 6 tablets daily, taper by one tablet every other day until off the medication. 08/17/17   York Spaniel, MD  rosuvastatin (CRESTOR) 20 MG tablet Take 10 mg by mouth daily.     [provider]    Family History Family History  Problem Relation Age of Onset  . Heart attack Mother   . Multiple sclerosis Sister   . Heart disease Sister   . Diabetes Other     Social History Social History   Tobacco Use  . Smoking status: Never Smoker  . Smokeless tobacco: Never Used  . Tobacco comment: quit 30-40 years ago (documented in 2013)  Substance Use Topics  . Alcohol use: No    Alcohol/week: 0.0 oz  . Drug use: No      Allergies   Sulfa antibiotics   Review of Systems Review of Systems  Unable to perform ROS: Mental status change     Physical Exam Updated Vital Signs BP 120/76 (BP Location: Right Arm)   Pulse 76   Temp (!) 101.1 F (38.4 C) (Rectal)   Resp 20   Ht 1.626 m (5\' 4" )   Wt 79.4 kg (175 lb)   SpO2 97%   BMI 30.04 kg/m   Physical Exam  Constitutional: She is oriented to person, place, and time. She appears well-developed and well-nourished.  Non-toxic appearance. No distress.  HENT:  Head: Normocephalic and atraumatic.  Eyes: Pupils are equal, round, and reactive to light. Conjunctivae, EOM and lids are normal.  Neck: Normal range of motion. Neck supple. No tracheal deviation present. No thyroid mass present.  Cardiovascular: Normal rate, regular rhythm and normal heart sounds. Exam reveals no gallop.  No murmur heard. Pulmonary/Chest: Effort normal and breath sounds normal. No stridor. No respiratory distress. She has no decreased breath sounds. She has no wheezes. She has no rhonchi. She has no rales.  Abdominal: Soft. Normal appearance and bowel sounds are normal. She exhibits no distension. There is no tenderness. There is no rebound and no CVA tenderness.  Musculoskeletal: Normal range of motion. She exhibits no edema or tenderness.  Neurological: She is alert and oriented to person, place, and time. She has normal strength. No cranial nerve deficit or sensory deficit. GCS eye subscore is 4. GCS verbal subscore is 5. GCS motor subscore is 6.  Skin: Skin is warm and dry. No abrasion and no rash noted.  Psychiatric: Her affect is blunt. Her speech is delayed. She is slowed.  Nursing note and vitals reviewed.    ED Treatments / Results  Labs (all labs ordered are listed, but only abnormal results are displayed) Labs Reviewed  CULTURE, BLOOD (ROUTINE X 2)  CULTURE, BLOOD (ROUTINE X 2)  COMPREHENSIVE METABOLIC PANEL  CBC WITH DIFFERENTIAL/PLATELET  URINALYSIS,  ROUTINE W REFLEX MICROSCOPIC  I-STAT CG4 LACTIC ACID, ED  I-STAT CG4 LACTIC ACID, ED    EKG EKG Interpretation  Date/Time:  Friday Sep 02 2017 16:40:59 EDT Ventricular Rate:  75 PR Interval:    QRS Duration: 80 QT Interval:  377 QTC Calculation: 421 R Axis:   12 Text Interpretation:  Sinus rhythm Abnormal R-wave progression, early transition Minimal ST elevation, anterior leads Confirmed by Lorre Nick ([id number]) on 09/02/2017 4:45:33 PM   Radiology No results found.  Procedures Procedures (including critical care time)  Medications Ordered in ED Medications - No data to display   Initial Impression / Assessment and Plan / ED Course  I have reviewed the triage vital signs and the  nursing notes.  Pertinent labs & imaging results that were available during my care of the patient were reviewed by me and considered in my medical decision making (see chart for details).    Patient here with altered mental status as well as takes Eliquis.  Head CT negative for intracranial hemorrhage.  Chest x-ray negative as well 2. Patient has signs of acute kidney injury.  Also has a temperature here.  Started on empiric antibiotics as well as given IV fluids for likely severe dehydration.  Will be admitted to the hospitalist service  CRITICAL CARE Performed by: Toy Baker Total critical care time: 50 minutes Critical care time was exclusive of separately billable procedures and treating other patients. Critical care was necessary to treat or prevent imminent or life-threatening deterioration. Critical care was time spent personally by me on the following activities: development of treatment plan with patient and/or surrogate as well as nursing, discussions with consultants, evaluation of patient's response to treatment, examination of patient, obtaining history from patient or surrogate, ordering and performing treatments and interventions, ordering and review of laboratory studies,  ordering and review of radiographic studies, pulse oximetry and re-evaluation of patient's condition.   Final Clinical Impressions(s) / ED Diagnoses   Final diagnoses:  None    ED Discharge Orders    None       Lorre Nick, MD 09/02/17 1844

## 2017-09-02 NOTE — Addendum Note (Signed)
Addended by: Geronimo Running A on: 09/02/2017 10:05 AM   Modules accepted: Orders

## 2017-09-02 NOTE — ED Triage Notes (Addendum)
Pt states that she "doesn't feel well". Fever of 100* . Recevied 500 cc of fluid. Concerned for UTI.  Increased lethargy

## 2017-09-02 NOTE — Progress Notes (Addendum)
A consult was received from an ED physician for Vancomycin and Zosyn per pharmacy dosing.  The patient's profile has been reviewed for ht/wt/allergies/indication/available labs.   A one time order has already been placed by provider for Vancomycin 1g IV and Zosyn 3.375g IV (30 minute infusion).  Further antibiotics/pharmacy consults should be ordered by admitting physician if indicated.                       Thank you, Jamse Mead 09/02/2017  5:22 PM  Addendum:  SCr just resulted: SCr 3.22 with CrCl ~ 16 ml/min.  Change Zosyn dose to 2.25g IV x 1 STAT. Continue same Vancomycin dose as above.   Greer Pickerel, PharmD, BCPS Pager: [phone] 09/02/2017 5:37 PM

## 2017-09-02 NOTE — ED Notes (Signed)
Bed: WA18 Expected date:  Expected time:  Means of arrival:  Comments: Hold for RES 

## 2017-09-02 NOTE — ED Notes (Signed)
ED TO INPATIENT HANDOFF REPORT  Name/Age/Gender Jillian Fisher 72 y.o. female  Code Status Code Status History    Date Active Date Inactive Code Status Order ID Comments User Context   02/25/2016 0140 02/27/2016 1531 Full Code [id number]  Rise Patience, MD Inpatient      Home/SNF/Other Home  Chief Complaint fever / possible uti  Level of Care/Admitting Diagnosis ED Disposition    ED Disposition Condition [facility] Area: [facility] [[id number]]  Level of Care: Stepdown [14]  Admit to SDU based on following criteria: Other see comments  Comments: severe lethargy  Diagnosis: SIRS (systemic inflammatory response syndrome) ([place]) [720947]  Admitting Physician: Toy Baker [[id number]]  Attending Physician: Toy Baker [[id number]]  Estimated length of stay: 3 - 4 days  Certification:: I certify this patient will need inpatient services for at least 2 midnights  PT Class (Do Not Modify): Inpatient [101]  PT Acc Code (Do Not Modify): Private [1]       Medical History Past Medical History:  Diagnosis Date  . A-fib ([place])   . Abnormality of gait 01/25/2013  . Arthritis   . GERD (gastroesophageal reflux disease)   . Hypercholesteremia   . Hypertension   . Hypothyroidism   . Multiple sclerosis ([place])   . Neurogenic bladder 01/25/2013  . Trigeminal neuralgia 07/07/2017   Right V3    Allergies Allergies  Allergen Reactions  . Sulfa Antibiotics Rash    IV Location/Drains/Wounds Patient Lines/Drains/Airways Status   Active Line/Drains/Airways    Name:   Placement date:   Placement time:   Site:   Days:   Peripheral IV 09/02/17 Left Antecubital   09/02/17    -    Antecubital   less than 1   Peripheral IV 09/02/17 Left Forearm   09/02/17    1638    Forearm   less than 1          Labs/Imaging Results for orders placed or performed during the hospital encounter of 09/02/17 (from the past 48 hour(s))  Comprehensive metabolic  panel     Status: Abnormal   Collection Time: 09/02/17  4:44 PM  Result Value Ref Range   Sodium 142 135 - 145 mmol/L   Potassium 4.2 3.5 - 5.1 mmol/L   Chloride 105 101 - 111 mmol/L   CO2 26 22 - 32 mmol/L   Glucose, Bld 161 (H) 65 - 99 mg/dL   BUN 80 (H) 6 - 20 mg/dL   Creatinine, Ser 3.22 (H) 0.44 - 1.00 mg/dL   Calcium 8.6 (L) 8.9 - 10.3 mg/dL   Total Protein 6.4 (L) 6.5 - 8.1 g/dL   Albumin 2.8 (L) 3.5 - 5.0 g/dL   AST 57 (H) 15 - 41 U/L   ALT 37 14 - 54 U/L   Alkaline Phosphatase 52 38 - 126 U/L   Total Bilirubin 0.5 0.3 - 1.2 mg/dL   GFR calc non Af Amer 13 (L) >60 mL/min   GFR calc Af Amer 16 (L) >60 mL/min    Comment: (NOTE) The eGFR has been calculated using the CKD EPI equation. This calculation has not been validated in all clinical situations. eGFR's persistently <60 mL/min signify possible Chronic Kidney Disease.    Anion gap 11 5 - 15    Comment: Performed at [facility], [place] [address], [place], [place] [address]  CBC WITH DIFFERENTIAL     Status: Abnormal   Collection Time: 09/02/17  4:44  PM  Result Value Ref Range   WBC 19.7 (H) 4.0 - 10.5 K/uL   RBC 3.84 (L) 3.87 - 5.11 MIL/uL   Hemoglobin 11.4 (L) 12.0 - 15.0 g/dL   HCT 35.6 (L) 36.0 - 46.0 %   MCV 92.7 78.0 - 100.0 fL   MCH 29.7 26.0 - 34.0 pg   MCHC 32.0 30.0 - 36.0 g/dL   RDW 14.5 11.5 - 15.5 %   Platelets 248 150 - 400 K/uL   Neutrophils Relative % 83 %   Neutro Abs 16.3 (H) 1.7 - 7.7 K/uL   Lymphocytes Relative 9 %   Lymphs Abs 1.9 0.7 - 4.0 K/uL   Monocytes Relative 8 %   Monocytes Absolute 1.5 (H) 0.1 - 1.0 K/uL   Eosinophils Relative 0 %   Eosinophils Absolute 0.1 0.0 - 0.7 K/uL   Basophils Relative 0 %   Basophils Absolute 0.0 0.0 - 0.1 K/uL    Comment: Performed at [facility], [place] [address], [place], [place] [address]  Urinalysis, Routine w reflex microscopic     Status: None   Collection Time: 09/02/17  4:50 PM  Result Value Ref Range    Color, Urine YELLOW YELLOW   APPearance CLEAR CLEAR   Specific Gravity, Urine 1.010 1.005 - 1.030   pH 6.0 5.0 - 8.0   Glucose, UA NEGATIVE NEGATIVE mg/dL   Hgb urine dipstick NEGATIVE NEGATIVE   Bilirubin Urine NEGATIVE NEGATIVE   Ketones, ur NEGATIVE NEGATIVE mg/dL   Protein, ur NEGATIVE NEGATIVE mg/dL   Nitrite NEGATIVE NEGATIVE   Leukocytes, UA NEGATIVE NEGATIVE    Comment: Performed at [place] [address], [place], [place] [address]  I-Stat CG4 Lactic Acid, ED  (not at  [facility])     Status: Abnormal   Collection Time: 09/02/17  4:56 PM  Result Value Ref Range   Lactic Acid, Venous 2.02 (HH) 0.5 - 1.9 mmol/L   Comment NOTIFIED PHYSICIAN   I-Stat CG4 Lactic Acid, ED  (not at  [facility])     Status: Abnormal   Collection Time: 09/02/17  7:50 PM  Result Value Ref Range   Lactic Acid, Venous 3.26 (HH) 0.5 - 1.9 mmol/L   Comment NOTIFIED PHYSICIAN    Dg Chest 2 View  Result Date: 09/02/2017 CLINICAL DATA:  Shortness of breath EXAM: CHEST - 2 VIEW COMPARISON:  02/02/2016 FINDINGS: Cardiac shadow is mildly enlarged. The lungs are well aerated bilaterally. No focal infiltrate or sizable effusion is seen. IMPRESSION: No acute abnormality noted. Electronically Signed   By: Inez Catalina M.D.   On: 09/02/2017 17:34   Ct Head Wo Contrast  Result Date: 09/02/2017 CLINICAL DATA:  Altered level of consciousness. Fever. Lethargy. History of multiple sclerosis. EXAM: CT HEAD WITHOUT CONTRAST TECHNIQUE: Contiguous axial images were obtained from the base of the skull through the vertex without intravenous contrast. COMPARISON:  Brain MRI 08/13/2017 FINDINGS: Brain: There is no evidence of acute infarct, intracranial hemorrhage, mass, midline shift, or extra-axial fluid collection. The ventricles and sulci are within normal limits for age. Extensive patchy to confluent hypodensity is present throughout the cerebral white matter. Vascular: No hyperdense vessel. Skull: No fracture or  focal osseous lesion. Sinuses/Orbits: Paranasal sinuses and mastoid air cells are clear. Unremarkable orbits. Other: None. IMPRESSION: 1. No evidence of acute intracranial abnormality. 2. Extensive white matter disease consistent with known multiple sclerosis. Electronically Signed   By: Logan Bores M.D.   On: 09/02/2017 17:37  Pending Labs Unresulted Labs (From admission, onward)   Start     Ordered   09/04/17 0500  Creatinine, serum  Daily,   R     09/02/17 2204   09/02/17 2307  CK  Add-on,   R     09/02/17 2306   09/02/17 2249  Blood gas, venous  STAT,   R     09/02/17 2248   09/02/17 1643  Blood Culture (routine x 2)  BLOOD CULTURE X 2,   STAT     09/02/17 1642   Signed and Held  Sodium, urine, random  Once,   R     Signed and Held   Signed and Held  Creatinine, urine, random  Once,   R     Signed and Held   Signed and Held  Lactic acid, plasma  STAT Now then every 3 hours,   STAT     Signed and Held   Signed and Held  Procalcitonin  STAT,   R     Signed and Held   Signed and Held  Magnesium  Tomorrow morning,   R    Comments:  Call MD if <1.5    Signed and Held   Signed and Held  Phosphorus  Tomorrow morning,   R     Signed and Held   Signed and Held  TSH  Once,   R    Comments:  Cancel if already done within 1 month and notify MD    Signed and Held   Signed and Held  Comprehensive metabolic panel  Once,   R    Comments:  Cal MD for K<3.5 or >5.0    Signed and Held   Signed and Held  CBC  Once,   R    Comments:  Call for hg <8.0    Signed and Held   Signed and Held  Ammonia  Once,   STAT     Signed and Held   Signed and Held  Respiratory Panel by PCR  (Respiratory virus panel)  Once,   R     Signed and Held      Vitals/Pain Today's Vitals   09/02/17 1846 09/02/17 1900 09/02/17 2000 09/02/17 2200  BP: (!) 126/106 (!) 148/120 (!) 142/63 (!) 128/50  Pulse: 83 83  73  Resp: (!) 29 20 (!) 21 18  Temp:      TempSrc:      SpO2: 99% 97% 97% 95%  Weight:       Height:      PainSc:        Isolation Precautions No active isolations  Medications Medications  vancomycin (VANCOCIN) 500 mg in sodium chloride 0.9 % 100 mL IVPB (has no administration in time range)  piperacillin-tazobactam (ZOSYN) IVPB 2.25 g (has no administration in time range)  0.9 %  sodium chloride infusion (has no administration in time range)  vancomycin (VANCOCIN) IVPB 1000 mg/200 mL premix (0 mg Intravenous Stopped 09/02/17 1840)  piperacillin-tazobactam (ZOSYN) IVPB 2.25 g (0 g Intravenous Stopped 09/02/17 1808)  sodium chloride 0.9 % bolus 1,000 mL (0 mLs Intravenous Stopped 09/02/17 2002)    And  sodium chloride 0.9 % bolus 1,000 mL (0 mLs Intravenous Stopped 09/02/17 2001)    And  sodium chloride 0.9 % bolus 500 mL (0 mLs Intravenous Stopped 09/02/17 1830)    Mobility non-ambulatory currently due to lethargy

## 2017-09-02 NOTE — Telephone Encounter (Signed)
Received ph call from [facility] for concern about starting IV solumedrol x 5 d.  Pt is lethargic, and has currently an oral temp per [facility] Nurse Ms. Odom of 100.7 and BP of 170/46. Per husband and daughter, who was contacted by ph by [facility], pt has been lethargic for the past 4-5 days.  I do not feel it's safe for patient to proceed with IV steroids in this situation. I advised HH nurse to not start IV solumedrol, which would be the first dose of 5 today and explain to Husband that pt needs to be checked out in ER. The nurse did not think the husband could take pt to ER himself, therefore I advised her to call 911 and have her taken to ER. Could be UTI, URI, difficult to tell over phone, but in the context of AMS and fever, she needs to seek immediate medical attention.  Pt was seen earlier this mo for urgent FU and had blood work and UA, no sign of infection at the time.

## 2017-09-02 NOTE — Telephone Encounter (Signed)
Received a call from [facility], RN in the [facility] pharmacy. They need an order for the solumedrol, OV notes, and recent labs on the pt before they can provide care. They will try to have the pt infused at 4pm today, if not, they will start her infusion tomorrow.   Will send referral to [facility] with the requested information.

## 2017-09-02 NOTE — Progress Notes (Signed)
Pharmacy Antibiotic Note  Jillian Fisher is a 72 y.o. female admitted on 09/02/2017 with sepsis.  Pharmacy has been consulted for Vancomycin and Zosyn dosing.  Plan: Vancomycin 1g IV x 1 given in the ED. Continue with Vancomycin 500mg  IV q36h-next dose tomorrow afternoon.  Plan for Vancomycin peak/trough levels at steady state, as indicated.  Zosyn 2.25g q8h Monitor renal function (daily SCr), cultures, clinical course.   Height: 5\' 4"  (162.6 cm) Weight: 175 lb (79.4 kg) IBW/kg (Calculated) : 54.7  Temp (24hrs), Avg:99.8 F (37.7 C), Min:98.4 F (36.9 C), Max:101.1 F (38.4 C)  Recent Labs  Lab 09/02/17 1644 09/02/17 1656 09/02/17 1950  WBC 19.7*  --   --   CREATININE 3.22*  --   --   LATICACIDVEN  --  2.02* 3.26*    Estimated Creatinine Clearance: 16.3 mL/min (A) (by C-G formula based on SCr of 3.22 mg/dL (H)).    Allergies  Allergen Reactions  . Sulfa Antibiotics Rash    Antimicrobials this admission: 5/31 Vancomycin >> 5/31 Zosyn >>  Dose adjustments this admission: --  Microbiology results: 5/31 BCx: sent  Thank you for allowing pharmacy to be a part of this patient's care.   Greer Pickerel, PharmD, BCPS Pager: [phone] 09/02/2017 9:42 PM

## 2017-09-02 NOTE — ED Notes (Signed)
Writer notified EDP Allen of abnormal I stat lactic result 3.26

## 2017-09-03 ENCOUNTER — Other Ambulatory Visit: Payer: Self-pay

## 2017-09-03 ENCOUNTER — Inpatient Hospital Stay (HOSPITAL_COMMUNITY): Payer: Medicare HMO

## 2017-09-03 DIAGNOSIS — G9340 Encephalopathy, unspecified: Secondary | ICD-10-CM

## 2017-09-03 DIAGNOSIS — G35 Multiple sclerosis: Secondary | ICD-10-CM

## 2017-09-03 DIAGNOSIS — N319 Neuromuscular dysfunction of bladder, unspecified: Secondary | ICD-10-CM

## 2017-09-03 LAB — CK: CK TOTAL: 454 U/L — AB (ref 38–234)

## 2017-09-03 LAB — RAPID URINE DRUG SCREEN, HOSP PERFORMED
Amphetamines: NOT DETECTED
Barbiturates: NOT DETECTED
Benzodiazepines: NOT DETECTED
Cocaine: NOT DETECTED
OPIATES: NOT DETECTED
TETRAHYDROCANNABINOL: NOT DETECTED

## 2017-09-03 LAB — CBC
HCT: 35 % — ABNORMAL LOW (ref 36.0–46.0)
Hemoglobin: 11.2 g/dL — ABNORMAL LOW (ref 12.0–15.0)
MCH: 29.4 pg (ref 26.0–34.0)
MCHC: 32 g/dL (ref 30.0–36.0)
MCV: 91.9 fL (ref 78.0–100.0)
PLATELETS: 266 10*3/uL (ref 150–400)
RBC: 3.81 MIL/uL — ABNORMAL LOW (ref 3.87–5.11)
RDW: 14.6 % (ref 11.5–15.5)
WBC: 18.8 10*3/uL — ABNORMAL HIGH (ref 4.0–10.5)

## 2017-09-03 LAB — PROCALCITONIN: Procalcitonin: 1.07 ng/mL

## 2017-09-03 LAB — MAGNESIUM: Magnesium: 2.1 mg/dL (ref 1.7–2.4)

## 2017-09-03 LAB — RESPIRATORY PANEL BY PCR
Adenovirus: NOT DETECTED
BORDETELLA PERTUSSIS-RVPCR: NOT DETECTED
CHLAMYDOPHILA PNEUMONIAE-RVPPCR: NOT DETECTED
CORONAVIRUS 229E-RVPPCR: NOT DETECTED
Coronavirus HKU1: NOT DETECTED
Coronavirus NL63: NOT DETECTED
Coronavirus OC43: NOT DETECTED
INFLUENZA B-RVPPCR: NOT DETECTED
Influenza A: NOT DETECTED
METAPNEUMOVIRUS-RVPPCR: NOT DETECTED
Mycoplasma pneumoniae: NOT DETECTED
PARAINFLUENZA VIRUS 2-RVPPCR: NOT DETECTED
PARAINFLUENZA VIRUS 3-RVPPCR: NOT DETECTED
Parainfluenza Virus 1: NOT DETECTED
Parainfluenza Virus 4: NOT DETECTED
RESPIRATORY SYNCYTIAL VIRUS-RVPPCR: NOT DETECTED
RHINOVIRUS / ENTEROVIRUS - RVPPCR: NOT DETECTED

## 2017-09-03 LAB — MRSA PCR SCREENING: MRSA BY PCR: NEGATIVE

## 2017-09-03 LAB — AMMONIA: AMMONIA: 14 umol/L (ref 9–35)

## 2017-09-03 LAB — COMPREHENSIVE METABOLIC PANEL
ALBUMIN: 2.9 g/dL — AB (ref 3.5–5.0)
ALT: 62 U/L — AB (ref 14–54)
AST: 87 U/L — ABNORMAL HIGH (ref 15–41)
Alkaline Phosphatase: 62 U/L (ref 38–126)
Anion gap: 15 (ref 5–15)
BUN: 63 mg/dL — AB (ref 6–20)
CHLORIDE: 111 mmol/L (ref 101–111)
CO2: 19 mmol/L — AB (ref 22–32)
CREATININE: 1.96 mg/dL — AB (ref 0.44–1.00)
Calcium: 8.4 mg/dL — ABNORMAL LOW (ref 8.9–10.3)
GFR calc Af Amer: 28 mL/min — ABNORMAL LOW (ref >60)
GFR calc non Af Amer: 25 mL/min — ABNORMAL LOW (ref >60)
GLUCOSE: 147 mg/dL — AB (ref 65–99)
Potassium: 3.6 mmol/L (ref 3.5–5.1)
SODIUM: 145 mmol/L (ref 135–145)
Total Bilirubin: 0.7 mg/dL (ref 0.3–1.2)
Total Protein: 6.7 g/dL (ref 6.5–8.1)

## 2017-09-03 LAB — CREATININE, URINE, RANDOM: CREATININE, URINE: 67.73 mg/dL

## 2017-09-03 LAB — SODIUM, URINE, RANDOM: Sodium, Ur: 71 mmol/L

## 2017-09-03 LAB — PHOSPHORUS: Phosphorus: 3.7 mg/dL (ref 2.5–4.6)

## 2017-09-03 LAB — TSH: TSH: 3.017 u[IU]/mL (ref 0.350–4.500)

## 2017-09-03 LAB — LACTIC ACID, PLASMA
LACTIC ACID, VENOUS: 1.1 mmol/L (ref 0.5–1.9)
Lactic Acid, Venous: 1.1 mmol/L (ref 0.5–1.9)

## 2017-09-03 MED ORDER — OXCARBAZEPINE 150 MG PO TABS
150.0000 mg | ORAL_TABLET | Freq: Two times a day (BID) | ORAL | Status: DC
  Administered 2017-09-03 – 2017-09-08 (×11): 150 mg via ORAL
  Filled 2017-09-03 (×13): qty 1

## 2017-09-03 MED ORDER — SODIUM CHLORIDE 0.45 % IV SOLN
INTRAVENOUS | Status: DC
  Administered 2017-09-03: 19:00:00 via INTRAVENOUS

## 2017-09-03 MED ORDER — VANCOMYCIN HCL IN DEXTROSE 1-5 GM/200ML-% IV SOLN: 1000.0000 mg | INTRAVENOUS | Status: DC

## 2017-09-03 MED ORDER — PANTOPRAZOLE SODIUM 40 MG PO TBEC
40.0000 mg | DELAYED_RELEASE_TABLET | Freq: Every day | ORAL | Status: DC
  Administered 2017-09-03 – 2017-09-08 (×6): 40 mg via ORAL
  Filled 2017-09-03 (×6): qty 1

## 2017-09-03 MED ORDER — ONDANSETRON HCL 4 MG PO TABS: 4.0000 mg | ORAL_TABLET | Freq: Four times a day (QID) | ORAL | Status: DC | PRN

## 2017-09-03 MED ORDER — SODIUM CHLORIDE 0.9 % IV SOLN
INTRAVENOUS | Status: AC
  Administered 2017-09-03: 01:00:00 via INTRAVENOUS

## 2017-09-03 MED ORDER — LEVOTHYROXINE SODIUM 25 MCG PO TABS
125.0000 ug | ORAL_TABLET | Freq: Every day | ORAL | Status: DC
  Administered 2017-09-03 – 2017-09-08 (×6): 125 ug via ORAL
  Filled 2017-09-03 (×6): qty 1

## 2017-09-03 MED ORDER — GABAPENTIN 300 MG PO CAPS
300.0000 mg | ORAL_CAPSULE | Freq: Four times a day (QID) | ORAL | Status: DC
  Administered 2017-09-03 – 2017-09-08 (×22): 300 mg via ORAL
  Filled 2017-09-03 (×22): qty 1

## 2017-09-03 MED ORDER — DILTIAZEM HCL ER COATED BEADS 240 MG PO CP24
240.0000 mg | ORAL_CAPSULE | Freq: Every day | ORAL | Status: DC
  Administered 2017-09-03 – 2017-09-08 (×6): 240 mg via ORAL
  Filled 2017-09-03 (×2): qty 1
  Filled 2017-09-03: qty 2
  Filled 2017-09-03 (×3): qty 1

## 2017-09-03 MED ORDER — METOPROLOL TARTRATE 25 MG PO TABS
25.0000 mg | ORAL_TABLET | Freq: Two times a day (BID) | ORAL | Status: DC
  Administered 2017-09-03 – 2017-09-06 (×7): 25 mg via ORAL
  Filled 2017-09-03 (×8): qty 1

## 2017-09-03 MED ORDER — LABETALOL HCL 5 MG/ML IV SOLN
10.0000 mg | INTRAVENOUS | Status: DC | PRN
  Administered 2017-09-03: 10 mg via INTRAVENOUS
  Filled 2017-09-03 (×2): qty 4

## 2017-09-03 MED ORDER — ACETAMINOPHEN 325 MG PO TABS
650.0000 mg | ORAL_TABLET | Freq: Four times a day (QID) | ORAL | Status: DC | PRN
  Administered 2017-09-05: 650 mg via ORAL
  Filled 2017-09-03: qty 2

## 2017-09-03 MED ORDER — ROSUVASTATIN CALCIUM 10 MG PO TABS
10.0000 mg | ORAL_TABLET | Freq: Every day | ORAL | Status: DC
  Administered 2017-09-03 – 2017-09-07 (×5): 10 mg via ORAL
  Filled 2017-09-03 (×5): qty 1

## 2017-09-03 MED ORDER — HYDRALAZINE HCL 20 MG/ML IJ SOLN
5.0000 mg | INTRAMUSCULAR | Status: DC | PRN
  Administered 2017-09-03 – 2017-09-04 (×4): 5 mg via INTRAVENOUS
  Filled 2017-09-03 (×4): qty 1

## 2017-09-03 MED ORDER — PIPERACILLIN-TAZOBACTAM 3.375 G IVPB
3.3750 g | Freq: Three times a day (TID) | INTRAVENOUS | Status: DC
  Filled 2017-09-03: qty 50

## 2017-09-03 MED ORDER — LIP MEDEX EX OINT
TOPICAL_OINTMENT | CUTANEOUS | Status: AC
  Filled 2017-09-03: qty 7

## 2017-09-03 MED ORDER — OXYBUTYNIN CHLORIDE 5 MG PO TABS
5.0000 mg | ORAL_TABLET | Freq: Every day | ORAL | Status: DC
  Administered 2017-09-03: 5 mg via ORAL
  Filled 2017-09-03: qty 1

## 2017-09-03 MED ORDER — ACETAMINOPHEN 650 MG RE SUPP
650.0000 mg | Freq: Four times a day (QID) | RECTAL | Status: DC | PRN
  Filled 2017-09-03: qty 1

## 2017-09-03 MED ORDER — APIXABAN 5 MG PO TABS
5.0000 mg | ORAL_TABLET | Freq: Two times a day (BID) | ORAL | Status: DC
  Administered 2017-09-03 – 2017-09-08 (×12): 5 mg via ORAL
  Filled 2017-09-03 (×12): qty 1

## 2017-09-03 MED ORDER — ONDANSETRON HCL 4 MG/2ML IJ SOLN: 4.0000 mg | Freq: Four times a day (QID) | INTRAMUSCULAR | Status: DC | PRN

## 2017-09-03 NOTE — Progress Notes (Signed)
PROGRESS NOTE    Jillian Fisher   ZOX:[id number]  DOB: 04/30/1945  DOA: 09/02/2017 PCP: Clovis Riley, L.August Saucer, MD   Brief Narrative:  Jillian Fisher  is a 72 y.o. female with advanced MS, neurogenic bladder, recurrent UTIs, atrial fibrillation on anticoagulation,hypertension, hyperlipidemia and hypothyroidism, presented to [facility] emergency room for evaluation of lethargy that had been ongoing for past for 5 days along with low-grade fevers.    She was scheduled to get 5 days of IV Solu-Medrol (Dr Anne Hahn) for presumed MS exacerbation, when the home health nurse found her to be altered and with a fever of 100.7.  EMS gave her 500 cc of IVF.  On-call neurologist was called by [facility] & recommended evaluation in the emergency room.  Given Vanc and Zosyn in ED. UA neg for infection. BUN/ Cr 80 and 3.22, LA 2.02> 3.26 > 1.1. Procalcitonin 1.07. WBC 19.7   Subjective: Appears slightly confused and history may not be accurate. No cold/ flu like symptoms. No diarrhea, abd pain or vomiting at home.  History obtained from husband [name]. She was not eating or drinking well for 2-3 days, has been very sleepy & has not been walking. He asked me to speak with their daughter for further info.   I then spoke with her daughter who states she would drink water from her in the AM and she could tell she was thirsty but she cannot spend the day with her and she has not been over drinking well. She did have diarrhea Wed morning and non since. Her urine ouput has decreased and her depends have not been wet as before.     Assessment & Plan:   Principal Problem:   AKI (acute kidney injury)   Elevated lactic acid level   Dehydration - appears prerenal- cont IVF as Cr has improved considerably with this - good urine output > 1 L overnight   Active Problems: Fever ~ 100 / leukocytosis/  SIRS (systemic inflammatory response syndrome)  - no source found- UA neg, no resp symptoms- CXR neg- no GI symptoms- no  rash - she does have mild diffuse abdominal tenderness - have asked RN to notify me if she has diarrhea- will obtain a CT of the abdomen - stop antibiotics for now    Multiple sclerosis /   Acute encephalopathy - more alert than what was described in ED (was quite lethargic) - at baseline she is able to perform ADLs per her daughter [name] - MRI per Neurology - resume Neurontin    Neurogenic bladder? - apparently voids normally at home- no intermittent caths or chronic foley - cont Ditropan    PAF (paroxysmal atrial fibrillation)  Hypertensive urgency- SBP 190-200s now - currently sinus tach- resume Metoprolol and Cardizem  - cont Eliquis   Not sure why she is on Trileptal    DVT prophylaxis: Eliquis Code Status: Full code Family Communication:  Disposition Plan: cont to follow in SDU Consultants:   neurology Procedures:    Antimicrobials:  Anti-infectives (From admission, onward)   Start     Dose/Rate Route Frequency Ordered Stop   09/04/17 1800  vancomycin (VANCOCIN) IVPB 1000 mg/200 mL premix     1,000 mg 200 mL/hr over 60 Minutes Intravenous Every 48 hours 09/03/17 0821     09/03/17 1400  vancomycin (VANCOCIN) 500 mg in sodium chloride 0.9 % 100 mL IVPB  Status:  Discontinued     500 mg 100 mL/hr over 60 Minutes Intravenous Every 36 hours 09/02/17  2139 09/03/17 0821   09/03/17 0900  piperacillin-tazobactam (ZOSYN) IVPB 3.375 g     3.375 g 12.5 mL/hr over 240 Minutes Intravenous Every 8 hours 09/03/17 0821     09/03/17 0200  piperacillin-tazobactam (ZOSYN) IVPB 2.25 g  Status:  Discontinued     2.25 g 100 mL/hr over 30 Minutes Intravenous STAT 09/02/17 2202 09/02/17 2203   09/03/17 0200  piperacillin-tazobactam (ZOSYN) IVPB 2.25 g  Status:  Discontinued     2.25 g 100 mL/hr over 30 Minutes Intravenous Every 8 hours 09/02/17 2201 09/03/17 0821   09/02/17 1745  piperacillin-tazobactam (ZOSYN) IVPB 2.25 g     2.25 g 100 mL/hr over 30 Minutes Intravenous STAT  09/02/17 1736 09/02/17 1808   09/02/17 1730  piperacillin-tazobactam (ZOSYN) IVPB 3.375 g  Status:  Discontinued     3.375 g 100 mL/hr over 30 Minutes Intravenous  Once 09/02/17 1716 09/02/17 1736   09/02/17 1730  vancomycin (VANCOCIN) IVPB 1000 mg/200 mL premix     1,000 mg 200 mL/hr over 60 Minutes Intravenous  Once 09/02/17 1716 09/02/17 1840       Objective: Vitals:   09/03/17 0335 09/03/17 0400 09/03/17 0700 09/03/17 0800  BP:  (!) 206/64 (!) 178/52 (!) 193/54  Pulse:      Resp:  19 (!) 22 (!) 25  Temp: 98.9 F (37.2 C)     TempSrc: Oral     SpO2:  97% 98% 99%  Weight:      Height:        Intake/Output Summary (Last 24 hours) at 09/03/2017 0847 Last data filed at 09/03/2017 0300 Gross per 24 hour  Intake 4290.42 ml  Output 1175 ml  Net 3115.42 ml   Filed Weights   09/02/17 1635 09/03/17 0100  Weight: 79.4 kg (175 lb) 82.2 kg (181 lb 3.5 oz)    Examination: General exam: Appears comfortable  HEENT: PERRLA, oral mucosa moist, no sclera icterus or thrush Respiratory system: Clear to auscultation. Respiratory effort normal. Cardiovascular system: S1 & S2 heard, RRR.   Gastrointestinal system: Abdomen soft, mild diffuse tenderness, nondistended. Normal bowel sound. No organomegaly Extremities: No cyanosis, clubbing or edema Skin: No rashes or ulcers Psychiatry:  Flat affect- confused about history    Data Reviewed: I have personally reviewed following labs and imaging studies  CBC: Recent Labs  Lab 09/02/17 1644 09/03/17 0329  WBC 19.7* 18.8*  NEUTROABS 16.3*  --   HGB 11.4* 11.2*  HCT 35.6* 35.0*  MCV 92.7 91.9  PLT 248 266   Basic Metabolic Panel: Recent Labs  Lab 09/02/17 1644 09/03/17 0329  NA 142 145  K 4.2 3.6  CL 105 111  CO2 26 19*  GLUCOSE 161* 147*  BUN 80* 63*  CREATININE 3.22* 1.96*  CALCIUM 8.6* 8.4*  MG  --  2.1  PHOS  --  3.7   GFR: Estimated Creatinine Clearance: 27.3 mL/min (A) (by C-G formula based on SCr of 1.96 mg/dL  (H)). Liver Function Tests: Recent Labs  Lab 09/02/17 1644 09/03/17 0329  AST 57* 87*  ALT 37 62*  ALKPHOS 52 62  BILITOT 0.5 0.7  PROT 6.4* 6.7  ALBUMIN 2.8* 2.9*   No results for input(s): LIPASE, AMYLASE in the last 168 hours. Recent Labs  Lab 09/03/17 0037  AMMONIA 14   Coagulation Profile: No results for input(s): INR, PROTIME in the last 168 hours. Cardiac Enzymes: Recent Labs  Lab 09/03/17 0037  CKTOTAL 454*   BNP (last 3 results) No results  for input(s): PROBNP in the last 8760 hours. HbA1C: No results for input(s): HGBA1C in the last 72 hours. CBG: No results for input(s): GLUCAP in the last 168 hours. Lipid Profile: No results for input(s): CHOL, HDL, LDLCALC, TRIG, CHOLHDL, LDLDIRECT in the last 72 hours. Thyroid Function Tests: Recent Labs    09/03/17 0329  TSH 3.017   Anemia Panel: No results for input(s): VITAMINB12, FOLATE, FERRITIN, TIBC, IRON, RETICCTPCT in the last 72 hours. Urine analysis:    Component Value Date/Time   COLORURINE YELLOW 09/02/2017 1650   APPEARANCEUR CLEAR 09/02/2017 1650   APPEARANCEUR Clear 08/08/2017 0820   LABSPEC 1.010 09/02/2017 1650   PHURINE 6.0 09/02/2017 1650   GLUCOSEU NEGATIVE 09/02/2017 1650   HGBUR NEGATIVE 09/02/2017 1650   BILIRUBINUR NEGATIVE 09/02/2017 1650   BILIRUBINUR Negative 08/08/2017 0820   KETONESUR NEGATIVE 09/02/2017 1650   PROTEINUR NEGATIVE 09/02/2017 1650   NITRITE NEGATIVE 09/02/2017 1650   LEUKOCYTESUR NEGATIVE 09/02/2017 1650   LEUKOCYTESUR Negative 08/08/2017 0820   Sepsis Labs: @LABRCNTIP (procalcitonin:4,lacticidven:4) ) Recent Results (from the past 240 hour(s))  MRSA PCR Screening     Status: None   Collection Time: 09/03/17  1:13 AM  Result Value Ref Range Status   MRSA by PCR NEGATIVE NEGATIVE Final    Comment:        The GeneXpert MRSA Assay (FDA approved for NASAL specimens only), is one component of a comprehensive MRSA colonization surveillance program. It is  not intended to diagnose MRSA infection nor to guide or monitor treatment for MRSA infections. Performed at [facility], [address], [place], [place] [address]          Radiology Studies: Dg Chest 2 View  Result Date: 09/02/2017 CLINICAL DATA:  Shortness of breath EXAM: CHEST - 2 VIEW COMPARISON:  02/02/2016 FINDINGS: Cardiac shadow is mildly enlarged. The lungs are well aerated bilaterally. No focal infiltrate or sizable effusion is seen. IMPRESSION: No acute abnormality noted. Electronically Signed   By: Alcide Clever M.D.   On: 09/02/2017 17:34   Ct Head Wo Contrast  Result Date: 09/02/2017 CLINICAL DATA:  Altered level of consciousness. Fever. Lethargy. History of multiple sclerosis. EXAM: CT HEAD WITHOUT CONTRAST TECHNIQUE: Contiguous axial images were obtained from the base of the skull through the vertex without intravenous contrast. COMPARISON:  Brain MRI 08/13/2017 FINDINGS: Brain: There is no evidence of acute infarct, intracranial hemorrhage, mass, midline shift, or extra-axial fluid collection. The ventricles and sulci are within normal limits for age. Extensive patchy to confluent hypodensity is present throughout the cerebral white matter. Vascular: No hyperdense vessel. Skull: No fracture or focal osseous lesion. Sinuses/Orbits: Paranasal sinuses and mastoid air cells are clear. Unremarkable orbits. Other: None. IMPRESSION: 1. No evidence of acute intracranial abnormality. 2. Extensive white matter disease consistent with known multiple sclerosis. Electronically Signed   By: Sebastian Ache M.D.   On: 09/02/2017 17:37      Scheduled Meds: . apixaban  5 mg Oral BID  . diltiazem  240 mg Oral Daily  . rosuvastatin  10 mg Oral q1800   Continuous Infusions: . sodium chloride 100 mL/hr at 09/03/17 0300  . piperacillin-tazobactam (ZOSYN)  IV    . [START ON 09/04/2017] vancomycin       LOS: 1 day    Time spent in minutes: 40     Calvert Cantor,  MD [facility] Pager: [email or web address] Password [facility] 09/03/2017, 8:47 AM

## 2017-09-03 NOTE — Progress Notes (Addendum)
Subjective: Feels much improved.  Denies headache.  Exam: Vitals:   09/03/17 1200 09/03/17 1300  BP: (!) 144/96 (!) 169/62  Pulse:    Resp: 17 (!) 22  Temp:    SpO2: 98% 100%   Gen: In bed, NAD Resp: non-labored breathing, no acute distress Abd: soft, nt  Neuro: MS: Awake, alert, does not know month CN: Pupils reactive bilaterally, question mild left facial weakness but this is not definite Motor: She has at least 4+/5 strength in both arms, and her legs she has 3/5 strength bilaterally DTR: Clonus present bilaterally  Pertinent Labs: Leukocytosis  Impression: 72 year old female with multiple sclerosis on Tecfidera.  I suspect that her worsening is due to recrudescence in the setting of infection.  No signs of CNS infection and I do not feel that she needs an LP at this time, especially with improvement.  Unfortunately due to her AKI she cannot receive contrast but her stable brain MRI is suggestive that she does not have any active demyelination in the brain.  She has not had spinal imaging in quite some time, unfortunately, but I do think would be prudent to go ahead and repeat this.  With her active infection I think that aggressive immunosuppression would not be appropriate at this time.  Recommendations: 1) MRI cervical and thoracic spine 2) hold Tecfidera or steroids given infection. 3) antibiotics per internal medicine. 4) continue trileptal for trigeminal neuralgia.   Ritta Slot, MD [facility] [phone]  If 7pm- 7am, please page neurology on call as listed in AMION.

## 2017-09-03 NOTE — Consult Note (Addendum)
Neurology Consultation  Reason for Consult: AMS Referring Physician: Dr. Adela Glimpse  CC: Fever, altered mental status  History is obtained from: Admitting hospitalist, chart  HPI: Jillian Fisher is a 72 y.o. female past medical history of fairly advanced multiple sclerosis, neurogenic bladder, atrial fibrillation on anticoagulation, recurrent UTIs, hypertension, hyperlipidemia and hypothyroidism, presented to [facility] emergency room for evaluation of lethargy that had been ongoing for past for 5 days along with low-grade fevers.  According to chart review, she was scheduled to get 5 days of IV Solu-Medrol for presumed MS exacerbation, when the home health nurse found her to be altered and with low-grade fever.  On-call outpatient neurologist was called, who recommended evaluation in the emergency room.  Patient continued to be fairly lethargic in the emergency room at [facility].  She was recently started on Tecfidera for her MS.  Prior to that, she was on Betaseron for many years. Chart review of neurology notes also shows that her JC virus index is 2.41 on 08/15/2017 per Dr. Clarisa Kindred notes. Patient had been experiencing lower extremity weakness, more so on the right and had request for wheelchairs in the system. Patient was unable to provide any history.  No family member was present at the bedside. Neurological consultation was placed because of her history of MS, recent possible flare and this new progressive altered mental status and fevers.  ROS: Unable to obtain due to altered mental status.   Past Medical History:  Diagnosis Date  . A-fib (HCC)   . Abnormality of gait 01/25/2013  . Arthritis   . GERD (gastroesophageal reflux disease)   . Hypercholesteremia   . Hypertension   . Hypothyroidism   . Multiple sclerosis (HCC)   . Neurogenic bladder 01/25/2013  . Trigeminal neuralgia 07/07/2017   Right V3    Family History  Problem Relation Age of Onset  . Heart attack  Mother   . Multiple sclerosis Sister   . Heart disease Sister   . Diabetes Other    Social History:   reports that she has never smoked. She has never used smokeless tobacco. She reports that she does not drink alcohol or use drugs.  Medications  Current Facility-Administered Medications:  .  0.9 %  sodium chloride infusion, , Intravenous, Continuous, Doutova, Anastassia, MD, Last Rate: 100 mL/hr at 09/03/17 0053 .  0.9 %  sodium chloride infusion, 1,000 mL, Intravenous, Continuous, Doutova, Anastassia, MD, Last Rate: 100 mL/hr at 09/03/17 0044, 1,000 mL at 09/03/17 0044 .  acetaminophen (TYLENOL) tablet 650 mg, 650 mg, Oral, Q6H PRN **OR** acetaminophen (TYLENOL) suppository 650 mg, 650 mg, Rectal, Q6H PRN, Doutova, Anastassia, MD .  apixaban (ELIQUIS) tablet 5 mg, 5 mg, Oral, BID, Doutova, Anastassia, MD, 5 mg at 09/03/17 0045 .  diltiazem (CARDIZEM CD) 24 hr capsule 240 mg, 240 mg, Oral, Daily, Doutova, Anastassia, MD .  ondansetron (ZOFRAN) tablet 4 mg, 4 mg, Oral, Q6H PRN **OR** ondansetron (ZOFRAN) injection 4 mg, 4 mg, Intravenous, Q6H PRN, Doutova, Anastassia, MD .  piperacillin-tazobactam (ZOSYN) IVPB 2.25 g, 2.25 g, Intravenous, Q8H, Gadhia, Jigna M, RPH .  rosuvastatin (CRESTOR) tablet 10 mg, 10 mg, Oral, q1800, Doutova, Anastassia, MD .  vancomycin (VANCOCIN) 500 mg in sodium chloride 0.9 % 100 mL IVPB, 500 mg, Intravenous, Q36H, Jamse Mead, RPH  Exam: Current vital signs: BP (!) 128/50   Pulse 73   Temp 99.9 F (37.7 C) (Rectal)   Resp 18   Ht 5\' 4"  (1.626 m)  Wt 79.4 kg (175 lb)   SpO2 95%   BMI 30.04 kg/m  Vital signs in last 24 hours: Temp:  [98.4 F (36.9 C)-101.1 F (38.4 C)] 99.9 F (37.7 C) (05/31 2322) Pulse Rate:  [65-83] 73 (05/31 2200) Resp:  [18-29] 18 (05/31 2200) BP: (109-148)/(47-120) 128/50 (05/31 2200) SpO2:  [94 %-99 %] 95 % (05/31 2200) Weight:  [79.4 kg (175 lb)] 79.4 kg (175 lb) (05/31 1635) General: Well-developed well-nourished  patient in no acute distress HEENT: Normocephalic, atraumatic, dry oral mucous membranes Lungs: Clear to auscultation bilaterally with no wheezing Cardiovascular: S1-S2 heard, regular rate rhythm Abdomen: Soft nondistended nontender Extremities: Warm well perfused with intact peripheral pulses Neurological exam Mental status: Patient is very drowsy, difficult to arouse, barely opens her eyes to loud voice and noxious stimulus.  Does not consistently follow commands.  Poor attention concentration. Language: Cannot assess language due to cooperation but there is no gross dysarthria.  She seemed to follow some simple commands. Cranial nerves: Pupils equal round reactive to light, extraocular movements are intact, blinks to threat from both sides, face is grossly symmetric. Motor exam: Able to lift both right and left upper extremity against gravity.  Unable to lift bilateral lower extremities against gravity.  Is able to wiggle toes to command on both sides. Sensory exam: Intact light touch all over as evidenced by nodding and also intact to noxious them as evidenced by grimacing on all 4 extremities equally. Coordination: Did not perform finger-nose-finger or heel-knee-shin test. Could not check gait because of her mental status.  Labs I have reviewed labs in epic and the results pertinent to this consultation are: CBC    Component Value Date/Time   WBC 19.7 (H) 09/02/2017 1644   RBC 3.84 (L) 09/02/2017 1644   HGB 11.4 (L) 09/02/2017 1644   HGB 12.9 08/08/2017 0808   HCT 35.6 (L) 09/02/2017 1644   HCT 41.2 08/08/2017 0808   PLT 248 09/02/2017 1644   PLT 298 08/08/2017 0808   MCV 92.7 09/02/2017 1644   MCV 93 08/08/2017 0808   MCH 29.7 09/02/2017 1644   MCHC 32.0 09/02/2017 1644   RDW 14.5 09/02/2017 1644   RDW 14.5 08/08/2017 0808   LYMPHSABS 1.9 09/02/2017 1644   LYMPHSABS 2.2 08/08/2017 0808   MONOABS 1.5 (H) 09/02/2017 1644   EOSABS 0.1 09/02/2017 1644   EOSABS 0.0 08/08/2017  0808   BASOSABS 0.0 09/02/2017 1644   BASOSABS 0.0 08/08/2017 0808  CMP     Component Value Date/Time   NA 142 09/02/2017 1644   NA 148 (H) 08/08/2017 0808   K 4.2 09/02/2017 1644   CL 105 09/02/2017 1644   CO2 26 09/02/2017 1644   GLUCOSE 161 (H) 09/02/2017 1644   BUN 80 (H) 09/02/2017 1644   BUN 15 08/08/2017 0808   CREATININE 3.22 (H) 09/02/2017 1644   CALCIUM 8.6 (L) 09/02/2017 1644   PROT 6.4 (L) 09/02/2017 1644   PROT 6.7 08/08/2017 0808   ALBUMIN 2.8 (L) 09/02/2017 1644   ALBUMIN 3.7 08/08/2017 0808   AST 57 (H) 09/02/2017 1644   ALT 37 09/02/2017 1644   ALKPHOS 52 09/02/2017 1644   BILITOT 0.5 09/02/2017 1644   BILITOT 0.3 08/08/2017 0808   GFRNONAA 13 (L) 09/02/2017 1644   GFRAA 16 (L) 09/02/2017 1644  Imaging I have reviewed the images obtained: CT-scan of the brain-no acute changes, extensive white matter disease, unchanged from prior MRIs.  MRI examination of the brain done on 08/13/2017 shows extensive  T2 lesion volume consistent with her history of MS, in both cerebral hemispheres, middle cerebral peduncle and spinal cord with  resolution of patchy meningeal enhancement that was seen in February 2019.    Assessment:  72 year old woman past history of fairly advanced multiple sclerosis, neurogenic bladder, atrial fibrillation on anticoagulation, recurrent UTIs, hypertension hyperlipidemia hypothyroidism presented for evaluation of altered mental status and low-grade fever. Labs are revealing for leukocytosis.  Urinalysis is unremarkable.  Chest x-ray is unremarkable. Currently on Tecfidera, which is recently started for her for MS.  JC virus index is high. Most likely toxic metabolic encephalopathy in the setting of an underlying infection-source still under investigation. Other possibilities to consider PML given that she has been on immune suppression with interferons and has a high JC virus index and recent Tecfidera use. I doubt this is an acute exacerbation of  her MS, given her advanced age as well as the presentation.  Recommendations: MRI brain with and without contrast Evaluate for source of infection.  Agree with empiric antibiotic treatment until results become available. Further recs after results available.  -- Milon Dikes, MD [facility] Pager: [phone] If 7pm to 7am, please call on call as listed on AMION.

## 2017-09-03 NOTE — Progress Notes (Signed)
Pharmacy Antibiotic Note  Jillian Fisher is a 72 y.o. female with hx MS and neurogenic bladder, presented to the ED on 09/02/2017 with fever and AMS.  Vancomycin and zosyn started on admission for sepsis.  Today, 09/03/2017: - day 31 abx - Tmax 101.1, wbc elevated - scr down 1.96 (crcl~27) -- on NS at 100 ml/hr   Plan: - adjust zosyn to 3.375 gm IV q8h (infuse over 4 hrs) - change vancomycin dose to 1000 mg IV q48h - monitor renal function closely  __________________________________  Height: 5\' 4"  (162.6 cm) Weight: 181 lb 3.5 oz (82.2 kg) IBW/kg (Calculated) : 54.7  Temp (24hrs), Avg:99.5 F (37.5 C), Min:98.4 F (36.9 C), Max:101.1 F (38.4 C)  Recent Labs  Lab 09/02/17 1644 09/02/17 1656 09/02/17 1950 09/03/17 0037 09/03/17 0329  WBC 19.7*  --   --   --  18.8*  CREATININE 3.22*  --   --   --  1.96*  LATICACIDVEN  --  2.02* 3.26* 1.1 1.1    Estimated Creatinine Clearance: 27.3 mL/min (A) (by C-G formula based on SCr of 1.96 mg/dL (H)).    Allergies  Allergen Reactions  . Sulfa Antibiotics Rash    Antimicrobials this admission: 5/31 Vancomycin >> 5/31 Zosyn >>   Dose adjustments this admission: --   Microbiology results: 5/31 BCx x2:  6/1 MRSA PCR: neg 6/1 resp panel pcr:  Thank you for allowing pharmacy to be a part of this patient's care.  Lucia Gaskins 09/03/2017 8:12 AM

## 2017-09-03 NOTE — Evaluation (Signed)
Clinical/Bedside Swallow Evaluation Patient Details  Name: Jillian Fisher MRN: [id number] Date of Birth: January 02, 1946  Today's Date: 09/03/2017 Time: SLP Start Time (ACUTE ONLY): 1500 SLP Stop Time (ACUTE ONLY): 1520 SLP Time Calculation (min) (ACUTE ONLY): 20 min  Past Medical History:  Past Medical History:  Diagnosis Date  . A-fib (HCC)   . Abnormality of gait 01/25/2013  . Arthritis   . GERD (gastroesophageal reflux disease)   . Hypercholesteremia   . Hypertension   . Hypothyroidism   . Multiple sclerosis (HCC)   . Neurogenic bladder 01/25/2013  . Trigeminal neuralgia 07/07/2017   Right V3   Past Surgical History:  Past Surgical History:  Procedure Laterality Date  . ABDOMINAL HYSTERECTOMY     HPI:  Patient is a 72 y.o. female with advanced MS, neurogenic bladder, recurrent UTI's, atrial fibrillation, HTN, hyperlipidemia and hypothyroidism, who presented to [facility] ER for evaluation of lethargy that had been ongoing for past 5 days, with low grade fevers.    Assessment / Plan / Recommendation Clinical Impression  Patient presents with a mild-moderate oropharyngeal dysphagia characterized by decreased mastication and prolonged oral transit of regular solids, swallow initiation delay with thin liquids, but with no overt s/s of aspiration or penetration. Patient did not have top dentures in place and she did state that she typically does when eating, however SLP feels that even with top dentures, patient would likely still have difficulty with mastication and prolonged oral transit of solids. Patient presented with confusion, as when clinician asked her if it was difficulty to chew up a small piece of chicken, she said, "It went right down", and denied any difficulty with feeding herself, despite having significant difficulties managing utensils. At time of evaluation, clinician agreed to allow for regular solids, however after further consideration and review of patient's chart,  decided to change to mechanical soft solids.  SLP Visit Diagnosis: Dysphagia, oropharyngeal phase (R13.12)    Aspiration Risk  Mild aspiration risk    Diet Recommendation Thin liquid;Dysphagia 3 (Mech soft)   Liquid Administration via: Cup;Straw Medication Administration: Whole meds with liquid Supervision: Staff to assist with self feeding;Full supervision/cueing for compensatory strategies Compensations: Minimize environmental distractions;Small sips/bites;Slow rate Postural Changes: Seated upright at 90 degrees    Other  Recommendations Oral Care Recommendations: Oral care BID   Follow up Recommendations Home health SLP;Skilled Nursing facility      Frequency and Duration min 2x/week  2 weeks       Prognosis Prognosis for Safe Diet Advancement: Good      Swallow Study   General Date of Onset: 09/02/17 HPI: Patient is a 72 y.o. female with advanced MS, neurogenic bladder, recurrent UTI's, atrial fibrillation, HTN, hyperlipidemia and hypothyroidism, who presented to [facility] ER for evaluation of lethargy that had been ongoing for past 5 days, with low grade fevers.  Type of Study: Bedside Swallow Evaluation Previous Swallow Assessment: N/A Diet Prior to this Study: Regular;Thin liquids Respiratory Status: Room air History of Recent Intubation: No Behavior/Cognition: Alert;Cooperative;Confused Oral Cavity Assessment: Within Functional Limits Oral Care Completed by SLP: No Oral Cavity - Dentition: Edentulous(edentulous top, dentures  not present) Self-Feeding Abilities: Needs assist;Needs set up Patient Positioning: Upright in bed Baseline Vocal Quality: Low vocal intensity Volitional Cough: Cognitively unable to elicit Volitional Swallow: Unable to elicit    Oral/Motor/Sensory Function Overall Oral Motor/Sensory Function: Mild impairment Facial ROM: Within Functional Limits Facial Symmetry: Within Functional Limits Lingual ROM: Within Functional Limits Lingual  Symmetry: Within Functional Limits  Lingual Strength: Reduced   Ice Chips Ice chips: Not tested   Thin Liquid Thin Liquid: Impaired Presentation: Cup;Straw Pharyngeal  Phase Impairments: Suspected delayed Swallow;Decreased hyoid-laryngeal movement    Nectar Thick Nectar Thick Liquid: Not tested   Honey Thick Honey Thick Liquid: Not tested   Puree Puree: Not tested   Solid   GO   Solid: Impaired Oral Phase Impairments: Impaired mastication Oral Phase Functional Implications: Prolonged oral transit Pharyngeal Phase Impairments: Suspected delayed Swallow       Angela Nevin, MA, CCC-SLP 09/03/17 4:34 PM

## 2017-09-04 ENCOUNTER — Other Ambulatory Visit: Payer: Self-pay | Admitting: Neurology

## 2017-09-04 LAB — CREATININE, SERUM
CREATININE: 1.42 mg/dL — AB (ref 0.44–1.00)
GFR calc Af Amer: 42 mL/min — ABNORMAL LOW (ref >60)
GFR, EST NON AFRICAN AMERICAN: 36 mL/min — AB (ref >60)

## 2017-09-04 LAB — CBC
HCT: 36.9 % (ref 36.0–46.0)
Hemoglobin: 11.7 g/dL — ABNORMAL LOW (ref 12.0–15.0)
MCH: 29.2 pg (ref 26.0–34.0)
MCHC: 31.7 g/dL (ref 30.0–36.0)
MCV: 92 fL (ref 78.0–100.0)
PLATELETS: 253 10*3/uL (ref 150–400)
RBC: 4.01 MIL/uL (ref 3.87–5.11)
RDW: 14.5 % (ref 11.5–15.5)
WBC: 12.8 10*3/uL — ABNORMAL HIGH (ref 4.0–10.5)

## 2017-09-04 LAB — BASIC METABOLIC PANEL
Anion gap: 13 (ref 5–15)
BUN: 47 mg/dL — AB (ref 6–20)
CALCIUM: 8.9 mg/dL (ref 8.9–10.3)
CO2: 19 mmol/L — ABNORMAL LOW (ref 22–32)
CREATININE: 1.46 mg/dL — AB (ref 0.44–1.00)
Chloride: 108 mmol/L (ref 101–111)
GFR calc Af Amer: 41 mL/min — ABNORMAL LOW (ref >60)
GFR, EST NON AFRICAN AMERICAN: 35 mL/min — AB (ref >60)
Glucose, Bld: 136 mg/dL — ABNORMAL HIGH (ref 65–99)
Potassium: 3.8 mmol/L (ref 3.5–5.1)
SODIUM: 140 mmol/L (ref 135–145)

## 2017-09-04 MED ORDER — ENSURE ENLIVE PO LIQD
237.0000 mL | Freq: Two times a day (BID) | ORAL | Status: DC
  Administered 2017-09-04 – 2017-09-08 (×5): 237 mL via ORAL

## 2017-09-04 MED ORDER — HYDRALAZINE HCL 25 MG PO TABS
25.0000 mg | ORAL_TABLET | Freq: Three times a day (TID) | ORAL | Status: DC
  Administered 2017-09-04 – 2017-09-06 (×5): 25 mg via ORAL
  Filled 2017-09-04 (×5): qty 1

## 2017-09-04 MED ORDER — SODIUM BICARBONATE 8.4 % IV SOLN
INTRAVENOUS | Status: DC
  Administered 2017-09-04 – 2017-09-07 (×5): via INTRAVENOUS
  Filled 2017-09-04 (×7): qty 100

## 2017-09-04 NOTE — Progress Notes (Signed)
Initial Nutrition Assessment  DOCUMENTATION CODES:   Obesity unspecified  INTERVENTION:   Provide Ensure Enlive po BID, each supplement provides 350 kcal and 20 grams of protein  NUTRITION DIAGNOSIS:   Inadequate oral intake related to poor appetite as evidenced by per patient/family report.  GOAL:   Patient will meet greater than or equal to 90% of their needs  MONITOR:   PO intake, Supplement acceptance, Labs, Weight trends, I & O's  REASON FOR ASSESSMENT:   Malnutrition Screening Tool    ASSESSMENT:   72 y.o. female with advanced MS, neurogenic bladder, recurrent UTIs, atrial fibrillation on anticoagulation,hypertension, hyperlipidemia and hypothyroidism, presented to [facility] emergency room for evaluation of lethargy that had been ongoing for past for 5 days along with low-grade fevers.    Patient currently consuming 20-75% of meals. Per family, pt was not eating well for 2-3 days PTA. SLP evaluated 6/1, pt is at mild risk of aspiration. Recommended dysphagia 3 diet. Will order Ensure supplements to supplement diet. Weight is stable per chart review.   Medications reviewed. Labs reviewed: GFR: 41  NUTRITION - FOCUSED PHYSICAL EXAM:  Nutrition focused physical exam shows no sign of depletion of muscle mass or body fat.  Diet Order:   Diet Order           DIET DYS 3 Room service appropriate? Yes; Fluid consistency: Thin  Diet effective now          EDUCATION NEEDS:   No education needs have been identified at this time  Skin:  Skin Assessment: Reviewed RN Assessment  Last BM:  6/2  Height:   Ht Readings from Last 1 Encounters:  09/03/17 5\' 4"  (1.626 m)    Weight:   Wt Readings from Last 1 Encounters:  09/03/17 181 lb 3.5 oz (82.2 kg)    Ideal Body Weight:  54.5 kg  BMI:  Body mass index is 31.11 kg/m.  Estimated Nutritional Needs:   Kcal:  1550-1750  Protein:  60-70g  Fluid:  1.7L/day  Tilda Franco, MS, RD, LDN [facility]  Inpatient Clinical Dietitian Pager: [phone] After Hours Pager: [phone]

## 2017-09-04 NOTE — Progress Notes (Signed)
PROGRESS NOTE    Jillian Fisher   WUJ:[id number]  DOB: Jan 07, 1946  DOA: 09/02/2017 PCP: Clovis Riley, L.August Saucer, MD   Brief Narrative:  Jillian Fisher  is a 72 y.o. female with advanced MS, neurogenic bladder, recurrent UTIs, atrial fibrillation on anticoagulation,hypertension, hyperlipidemia and hypothyroidism, presented to [facility] emergency room for evaluation of lethargy that had been ongoing for past for 5 days along with low-grade fevers.   History obtained from husband [name]. She was not eating or drinking well for 2-3 days, has been very sleepy & has not been walking. He asked me to speak with their daughter for further info.   I then spoke with her daughter who states she would drink water from her in the AM and she could tell she was thirsty but she cannot spend the day with her and she has not been over drinking well. She did have diarrhea Wed morning and non since. Her urine ouput has decreased and her depends have not been wet as before.     She was scheduled to get 5 days of IV Solu-Medrol (Dr Anne Hahn) for presumed MS exacerbation, when the home health nurse found her to be altered and with a fever of 100.7.  EMS gave her 500 cc of IVF.  On-call neurologist was called by [facility] & recommended evaluation in the emergency room.  Given Vanc and Zosyn in ED. UA neg for infection. BUN/ Cr 80 and 3.22, LA 2.02> 3.26 > 1.1. Procalcitonin 1.07. WBC 19.7   Subjective: Very alert today. No complaints.   Assessment & Plan:   Principal Problem:   AKI (acute kidney injury)   Elevated lactic acid level   Dehydration Metabolic acidosis - appears prerenal-  Cr has improved considerably with IVF - Cr continues to improve- cut back on fluids- change to Bicarb infusion due to acidosis today - good urine output but scan showing enlarged bladder and chronic hydronephrosis  Active Problems: Fever ~ 100 / leukocytosis/  SIRS (systemic inflammatory response syndrome)  - no source found - UA  neg, no resp symptoms- CXR neg- no GI symptoms- no rash - she does have mild diffuse abdominal tenderness - -  CT of the abdomen reviewed- nothing significant other than enlarged bladder- report below- place foley - stopped antibiotics 6/1- WBC continues to improve- ? Viral infection    Multiple sclerosis /   Acute encephalopathy - more alert than what was described in ED (was quite lethargic) - at baseline she is able to perform ADLs per her daughter [name] - MRI per Neurology see report below which I have reviewed - 6/2 mental status significantly improved-- I have spoken with Dr Amada Jupiter today who does not feel she needs any IV steroids and suspects recrudence of symptoms due to possibly infection - resume Neurontin - neuro would like imaging of the spine as she has not had it in many years    Neurogenic bladder? - apparently voids normally at home- no intermittent caths or chronic foley - d/c Ditropan as bladder is distended and she has hydronephrosis- place foley    PAF (paroxysmal atrial fibrillation)  Hypertensive urgency- SBP 190-200s now -   Metoprolol and Cardizem - needing IV Hydralazine and Labetalol PRN - add 25 mg oral Hydralazine TID - cont Eliquis   Trigeminal neuralgia - cont Trileptal    DVT prophylaxis: Eliquis Code Status: Full code Family Communication:  Disposition Plan: cont to follow in SDU Consultants:   neurology Procedures:    Antimicrobials:  Anti-infectives (From admission, onward)   Start     Dose/Rate Route Frequency Ordered Stop   09/04/17 1800  vancomycin (VANCOCIN) IVPB 1000 mg/200 mL premix  Status:  Discontinued     1,000 mg 200 mL/hr over 60 Minutes Intravenous Every 48 hours 09/03/17 0821 09/03/17 0854   09/03/17 1400  vancomycin (VANCOCIN) 500 mg in sodium chloride 0.9 % 100 mL IVPB  Status:  Discontinued     500 mg 100 mL/hr over 60 Minutes Intravenous Every 36 hours 09/02/17 2139 09/03/17 0821   09/03/17 0900   piperacillin-tazobactam (ZOSYN) IVPB 3.375 g  Status:  Discontinued     3.375 g 12.5 mL/hr over 240 Minutes Intravenous Every 8 hours 09/03/17 0821 09/03/17 0854   09/03/17 0200  piperacillin-tazobactam (ZOSYN) IVPB 2.25 g  Status:  Discontinued     2.25 g 100 mL/hr over 30 Minutes Intravenous STAT 09/02/17 2202 09/02/17 2203   09/03/17 0200  piperacillin-tazobactam (ZOSYN) IVPB 2.25 g  Status:  Discontinued     2.25 g 100 mL/hr over 30 Minutes Intravenous Every 8 hours 09/02/17 2201 09/03/17 0821   09/02/17 1745  piperacillin-tazobactam (ZOSYN) IVPB 2.25 g     2.25 g 100 mL/hr over 30 Minutes Intravenous STAT 09/02/17 1736 09/02/17 1808   09/02/17 1730  piperacillin-tazobactam (ZOSYN) IVPB 3.375 g  Status:  Discontinued     3.375 g 100 mL/hr over 30 Minutes Intravenous  Once 09/02/17 1716 09/02/17 1736   09/02/17 1730  vancomycin (VANCOCIN) IVPB 1000 mg/200 mL premix     1,000 mg 200 mL/hr over 60 Minutes Intravenous  Once 09/02/17 1716 09/02/17 1840       Objective: Vitals:   09/04/17 0517 09/04/17 0800 09/04/17 0802 09/04/17 0932  BP: (!) 175/43 (!) 151/36  (!) 155/53  Pulse:    83  Resp:  (!) 26  16  Temp:   98.4 F (36.9 C) 98.2 F (36.8 C)  TempSrc:   Oral Oral  SpO2:  99%  100%  Weight:      Height:        Intake/Output Summary (Last 24 hours) at 09/04/2017 1232 Last data filed at 09/04/2017 1030 Gross per 24 hour  Intake 1583.33 ml  Output 1060 ml  Net 523.33 ml   Filed Weights   09/02/17 1635 09/03/17 0100  Weight: 79.4 kg (175 lb) 82.2 kg (181 lb 3.5 oz)    Examination: General exam: Appears comfortable  HEENT: PERRLA, oral mucosa moist, no sclera icterus or thrush Respiratory system: Clear to auscultation. Respiratory effort normal. Cardiovascular system: S1 & S2 heard, RRR.   Gastrointestinal system: Abdomen soft, mild diffuse tenderness, nondistended. Normal bowel sound. No organomegaly Neuro: can lift legs off the bed today which is new- very  alert Extremities: No cyanosis, clubbing or edema Skin: No rashes or ulcers Psychiatry:  Normal mood and affect    Data Reviewed: I have personally reviewed following labs and imaging studies  CBC: Recent Labs  Lab 09/02/17 1644 09/03/17 0329 09/04/17 0722  WBC 19.7* 18.8* 12.8*  NEUTROABS 16.3*  --   --   HGB 11.4* 11.2* 11.7*  HCT 35.6* 35.0* 36.9  MCV 92.7 91.9 92.0  PLT 248 266 253   Basic Metabolic Panel: Recent Labs  Lab 09/02/17 1644 09/03/17 0329 09/04/17 0338 09/04/17 0722  NA 142 145  --  140  K 4.2 3.6  --  3.8  CL 105 111  --  108  CO2 26 19*  --  19*  GLUCOSE  161* 147*  --  136*  BUN 80* 63*  --  47*  CREATININE 3.22* 1.96* 1.42* 1.46*  CALCIUM 8.6* 8.4*  --  8.9  MG  --  2.1  --   --   PHOS  --  3.7  --   --    GFR: Estimated Creatinine Clearance: 36.7 mL/min (A) (by C-G formula based on SCr of 1.46 mg/dL (H)). Liver Function Tests: Recent Labs  Lab 09/02/17 1644 09/03/17 0329  AST 57* 87*  ALT 37 62*  ALKPHOS 52 62  BILITOT 0.5 0.7  PROT 6.4* 6.7  ALBUMIN 2.8* 2.9*   No results for input(s): LIPASE, AMYLASE in the last 168 hours. Recent Labs  Lab 09/03/17 0037  AMMONIA 14   Coagulation Profile: No results for input(s): INR, PROTIME in the last 168 hours. Cardiac Enzymes: Recent Labs  Lab 09/03/17 0037  CKTOTAL 454*   BNP (last 3 results) No results for input(s): PROBNP in the last 8760 hours. HbA1C: No results for input(s): HGBA1C in the last 72 hours. CBG: No results for input(s): GLUCAP in the last 168 hours. Lipid Profile: No results for input(s): CHOL, HDL, LDLCALC, TRIG, CHOLHDL, LDLDIRECT in the last 72 hours. Thyroid Function Tests: Recent Labs    09/03/17 0329  TSH 3.017   Anemia Panel: No results for input(s): VITAMINB12, FOLATE, FERRITIN, TIBC, IRON, RETICCTPCT in the last 72 hours. Urine analysis:    Component Value Date/Time   COLORURINE YELLOW 09/02/2017 1650   APPEARANCEUR CLEAR 09/02/2017 1650    APPEARANCEUR Clear 08/08/2017 0820   LABSPEC 1.010 09/02/2017 1650   PHURINE 6.0 09/02/2017 1650   GLUCOSEU NEGATIVE 09/02/2017 1650   HGBUR NEGATIVE 09/02/2017 1650   BILIRUBINUR NEGATIVE 09/02/2017 1650   BILIRUBINUR Negative 08/08/2017 0820   KETONESUR NEGATIVE 09/02/2017 1650   PROTEINUR NEGATIVE 09/02/2017 1650   NITRITE NEGATIVE 09/02/2017 1650   LEUKOCYTESUR NEGATIVE 09/02/2017 1650   LEUKOCYTESUR Negative 08/08/2017 0820   Sepsis Labs: @LABRCNTIP (procalcitonin:4,lacticidven:4) ) Recent Results (from the past 240 hour(s))  Blood Culture (routine x 2)     Status: None (Preliminary result)   Collection Time: 09/02/17  4:44 PM  Result Value Ref Range Status   Specimen Description   Final    BLOOD LEFT FOREARM Performed at [facility], [address], [place], [place] [address]    Special Requests   Final    BOTTLES DRAWN AEROBIC AND ANAEROBIC Blood Culture results may not be optimal due to an excessive volume of blood received in culture bottles Performed at [facility], [address], [place], [place] [address]    Culture   Final    NO GROWTH 2 DAYS Performed at [facility], [address] [place], [place] [address]    Report Status PENDING  Incomplete  Blood Culture (routine x 2)     Status: None (Preliminary result)   Collection Time: 09/02/17  4:51 PM  Result Value Ref Range Status   Specimen Description   Final    BLOOD LEFT ANTECUBITAL Performed at [facility], [address], [place], [place] [address]    Special Requests   Final    BOTTLES DRAWN AEROBIC AND ANAEROBIC Blood Culture adequate volume Performed at [facility], [address] [place], [place] [address]    Culture   Final    NO GROWTH 2 DAYS Performed at [facility], [address], [place], [place] [address]  Report Status PENDING  Incomplete  Respiratory Panel by PCR     Status: None   Collection  Time: 09/03/17 12:50 AM  Result Value Ref Range Status   Adenovirus NOT DETECTED NOT DETECTED Final   Coronavirus 229E NOT DETECTED NOT DETECTED Final   Coronavirus HKU1 NOT DETECTED NOT DETECTED Final   Coronavirus NL63 NOT DETECTED NOT DETECTED Final   Coronavirus OC43 NOT DETECTED NOT DETECTED Final   Metapneumovirus NOT DETECTED NOT DETECTED Final   Rhinovirus / Enterovirus NOT DETECTED NOT DETECTED Final   Influenza A NOT DETECTED NOT DETECTED Final   Influenza B NOT DETECTED NOT DETECTED Final   Parainfluenza Virus 1 NOT DETECTED NOT DETECTED Final   Parainfluenza Virus 2 NOT DETECTED NOT DETECTED Final   Parainfluenza Virus 3 NOT DETECTED NOT DETECTED Final   Parainfluenza Virus 4 NOT DETECTED NOT DETECTED Final   Respiratory Syncytial Virus NOT DETECTED NOT DETECTED Final   Bordetella pertussis NOT DETECTED NOT DETECTED Final   Chlamydophila pneumoniae NOT DETECTED NOT DETECTED Final   Mycoplasma pneumoniae NOT DETECTED NOT DETECTED Final    Comment: Performed at [facility], [address] [place], [place] [address]  MRSA PCR Screening     Status: None   Collection Time: 09/03/17  1:13 AM  Result Value Ref Range Status   MRSA by PCR NEGATIVE NEGATIVE Final    Comment:        The GeneXpert MRSA Assay (FDA approved for NASAL specimens only), is one component of a comprehensive MRSA colonization surveillance program. It is not intended to diagnose MRSA infection nor to guide or monitor treatment for MRSA infections. Performed at [facility], [address], [place], [place] [address]          Radiology Studies: Ct Abdomen Pelvis Wo Contrast  Result Date: 09/03/2017 CLINICAL DATA:  "72 y.o. female with advanced MS, neurogenic bladder, recurrent UTIs, atrial fibrillation on anticoagulation,hypertension, hyperlipidemia and hypothyroidism, presented to [facility] emergency room for evaluation of lethargy that had been ongoing for past for 5  days along with low-grade fevers. " EXAM: CT ABDOMEN AND PELVIS WITHOUT CONTRAST TECHNIQUE: Multidetector CT imaging of the abdomen and pelvis was performed following the standard protocol without IV contrast. COMPARISON:  CT, 08/19/2008. FINDINGS: Lower chest: Minor dependent lung base subsegmental atelectasis. Lung bases otherwise clear. Heart normal size. Small sliding hiatal hernia. Hepatobiliary: No focal liver abnormality is seen. No gallstones, gallbladder wall thickening, or biliary dilatation. Pancreas: Unremarkable. No pancreatic ductal dilatation or surrounding inflammatory changes. Spleen: Normal in size without focal abnormality. Adrenals/Urinary Tract: No adrenal masses. Moderate right and mild left hydronephrosis. No renal masses or stones. Moderate dilation of the right ureter with mild dilation of the left ureter. No ureteral stones. Bladder is distended. No bladder wall thickening or masses. No bladder stones. Stomach/Bowel: Small sliding hiatal hernia. Stomach otherwise unremarkable. Bowel is normal in caliber. No bowel wall thickening or evidence of inflammation. Normal appendix visualized. Vascular/Lymphatic: Mild aortic atherosclerotic calcifications. No aneurysm. No pathologically enlarged lymph nodes. Reproductive: Status post hysterectomy. No adnexal masses. Other: Trace amount fluid attenuation noted in the left lower quadrant and left anterior pelvis anterior to the psoas muscle. No abdominal wall hernia. Inflammatory haziness noted in the subcutaneous fat the left inguinal region. Etiology is unclear. Musculoskeletal: No acute or significant osseous findings. IMPRESSION: 1. Moderate right and mild left hydroureteronephrosis, with no renal or ureteral stones. Bladder is distended. The hydronephrosis/hydroureter is likely due to chronic bladder  distention given the patient's history of neurogenic bladder. 2. Nonspecific infiltration of the fat in the left inguinal region, which appears  inflammatory. 3. Small sliding hiatal hernia.  Mild aortic atherosclerosis. 4. No convincing acute abnormality within the abdomen or pelvis. Electronically Signed   By: Amie Portland M.D.   On: 09/03/2017 10:39   Dg Chest 2 View  Result Date: 09/02/2017 CLINICAL DATA:  Shortness of breath EXAM: CHEST - 2 VIEW COMPARISON:  02/02/2016 FINDINGS: Cardiac shadow is mildly enlarged. The lungs are well aerated bilaterally. No focal infiltrate or sizable effusion is seen. IMPRESSION: No acute abnormality noted. Electronically Signed   By: Alcide Clever M.D.   On: 09/02/2017 17:34   Ct Head Wo Contrast  Result Date: 09/02/2017 CLINICAL DATA:  Altered level of consciousness. Fever. Lethargy. History of multiple sclerosis. EXAM: CT HEAD WITHOUT CONTRAST TECHNIQUE: Contiguous axial images were obtained from the base of the skull through the vertex without intravenous contrast. COMPARISON:  Brain MRI 08/13/2017 FINDINGS: Brain: There is no evidence of acute infarct, intracranial hemorrhage, mass, midline shift, or extra-axial fluid collection. The ventricles and sulci are within normal limits for age. Extensive patchy to confluent hypodensity is present throughout the cerebral white matter. Vascular: No hyperdense vessel. Skull: No fracture or focal osseous lesion. Sinuses/Orbits: Paranasal sinuses and mastoid air cells are clear. Unremarkable orbits. Other: None. IMPRESSION: 1. No evidence of acute intracranial abnormality. 2. Extensive white matter disease consistent with known multiple sclerosis. Electronically Signed   By: Sebastian Ache M.D.   On: 09/02/2017 17:37   Mr Brain Wo Contrast  Result Date: 09/03/2017 CLINICAL DATA:  Worsening of confusion.  Multiple sclerosis. EXAM: MRI HEAD WITHOUT CONTRAST TECHNIQUE: Multiplanar, multiecho pulse sequences of the brain and surrounding structures were obtained without intravenous contrast. COMPARISON:  CT 09/02/2017.  MRI 08/13/2017 and 05/21/2017. FINDINGS: Brain:  Diffusion imaging does not show any acute or subacute infarction. No abnormality is seen affecting the brainstem or cerebellum. Widespread changes of demyelinating disease affecting the periventricular and subcortical white matter are unchanged. Some scattered areas of cortical involvement in the frontal and parietal regions. No evidence of mass lesion, hemorrhage, hydrocephalus or extra-axial collection. Vascular: Major vessels at the base of the brain show flow. Skull and upper cervical spine: Negative Sinuses/Orbits: Clear/normal Other: None IMPRESSION: Advanced chronic changes of demyelinating disease affecting the cerebral hemispheric white matter as outlined above. No appreciable change since the previous studies of this year. No acute or reversible process identified. Electronically Signed   By: Paulina Fusi M.D.   On: 09/03/2017 10:41      Scheduled Meds: . apixaban  5 mg Oral BID  . diltiazem  240 mg Oral Daily  . feeding supplement (ENSURE ENLIVE)  237 mL Oral BID BM  . gabapentin  300 mg Oral QID  . levothyroxine  125 mcg Oral QAC breakfast  . metoprolol tartrate  25 mg Oral BID  . OXcarbazepine  150 mg Oral BID  . pantoprazole  40 mg Oral Daily  . rosuvastatin  10 mg Oral q1800   Continuous Infusions: .  sodium bicarbonate  infusion 1000 mL 75 mL/hr at 09/04/17 0907     LOS: 2 days    Time spent in minutes: 40     Calvert Cantor, MD [facility] Pager: [email or web address] Password [facility] 09/04/2017, 12:32 PM

## 2017-09-04 NOTE — Evaluation (Signed)
Physical Therapy Evaluation Patient Details Name: Jillian Fisher MRN: [id number] DOB: 1945-09-05 Today's Date: 09/04/2017   History of Present Illness  Pt admitted through ED with lethargy, fever, AKI, dehydration and with hx of MS and a-fib  Clinical Impression  Pt admitted as above and presenting with functional mobility limitations 2* significant generalized weakness and balance deficits.  Pt would benefit from follow up rehab at SNF level to maximize IND and safety.    Follow Up Recommendations SNF    Equipment Recommendations  None recommended by PT    Recommendations for Other Services       Precautions / Restrictions Precautions Precautions: Fall Restrictions Weight Bearing Restrictions: No      Mobility  Bed Mobility Overal bed mobility: Needs Assistance Bed Mobility: Supine to Sit;Sit to Supine;Rolling Rolling: Mod assist   Supine to sit: Mod assist;+2 for physical assistance;+2 for safety/equipment Sit to supine: +2 for physical assistance;+2 for safety/equipment;Mod assist;Max assist   General bed mobility comments: cues for sequence, physical assist to roll, bring legs over EOB and to bring trunk to upright and balance in sitting at EOB  Transfers Overall transfer level: Needs assistance Equipment used: Rolling walker (2 wheeled) Transfers: Sit to/from Stand Sit to Stand: Mod assist;Max assist;+2 physical assistance;+2 safety/equipment;From elevated surface         General transfer comment: Pt stood x 2 but unable to fully extend knees and achieve upright posture  Ambulation/Gait             General Gait Details: NT 2* pt weakness with attempts to stand  Stairs            Wheelchair Mobility    Modified Rankin (Stroke Patients Only)       Balance Overall balance assessment: Needs assistance Sitting-balance support: Bilateral upper extremity supported;Feet supported Sitting balance-Leahy Scale: Poor Sitting balance - Comments:  posterior and R drift   Standing balance support: Bilateral upper extremity supported Standing balance-Leahy Scale: Zero                               Pertinent Vitals/Pain Pain Assessment: No/denies pain    Home Living Family/patient expects to be discharged to:: Private residence Living Arrangements: Children(Pt states she lives with her dtr) Available Help at Discharge: Available PRN/intermittently;Family Type of Home: House Home Access: Level entry     Home Layout: One level Home Equipment: Walker - 2 wheels;Cane - single point;Bedside commode;Shower seat;Shower seat - built in      Prior Function Level of Independence: Needs assistance   Gait / Transfers Assistance Needed: Pt states she ambulates in home without assist or assist device  ADL's / Homemaking Assistance Needed: Assist of dtr  Comments: Pt states she lives with dtr who works during the day     Higher education careers adviser        Extremity/Trunk Assessment   Upper Extremity Assessment Upper Extremity Assessment: Generalized weakness    Lower Extremity Assessment Lower Extremity Assessment: Generalized weakness       Communication   Communication: No difficulties  Cognition Arousal/Alertness: Awake/alert Behavior During Therapy: Flat affect Overall Cognitive Status: No family/caregiver present to determine baseline cognitive functioning                                        General Comments  Exercises     Assessment/Plan    PT Assessment Patient needs continued PT services  PT Problem List Decreased strength;Decreased range of motion;Decreased activity tolerance;Decreased balance;Decreased mobility;Decreased knowledge of use of DME       PT Treatment Interventions DME instruction;Gait training;Functional mobility training;Therapeutic activities;Therapeutic exercise;Balance training;Patient/family education    PT Goals (Current goals can be found in the Care Plan  section)  Acute Rehab PT Goals Patient Stated Goal: Regain IND PT Goal Formulation: With patient Time For Goal Achievement: 09/18/17 Potential to Achieve Goals: Fair    Frequency Min 3X/week   Barriers to discharge Decreased caregiver support Pt states does not have 24/7    Co-evaluation               AM-PAC PT "6 Clicks" Daily Activity  Outcome Measure Difficulty turning over in bed (including adjusting bedclothes, sheets and blankets)?: Unable Difficulty moving from lying on back to sitting on the side of the bed? : Unable Difficulty sitting down on and standing up from a chair with arms (e.g., wheelchair, bedside commode, etc,.)?: Unable Help needed moving to and from a bed to chair (including a wheelchair)?: Total Help needed walking in hospital room?: Total Help needed climbing 3-5 steps with a railing? : Total 6 Click Score: 6    End of Session Equipment Utilized During Treatment: Gait belt Activity Tolerance: Patient limited by fatigue Patient left: in bed;with call bell/phone within reach Nurse Communication: Mobility status PT Visit Diagnosis: Muscle weakness (generalized) (M62.81);Difficulty in walking, not elsewhere classified (R26.2)    Time: 4098-1191 PT Time Calculation (min) (ACUTE ONLY): 27 min   Charges:   PT Evaluation $PT Eval Moderate Complexity: 1 Mod PT Treatments $Therapeutic Activity: 8-22 mins   PT G Codes:        Pg [phone]   Keasia Dubose 09/04/2017, 3:15 PM

## 2017-09-04 NOTE — Care Management Note (Signed)
Case Management Note  Patient Details  Name: Jillian Fisher MRN: [id number] Date of Birth: Aug 31, 1945  Subjective/Objective:    AKI, SIRS, MS, HTN urgency               Action/Plan: Received call from [facility] and pt active for HHRN/PT. Will need resumption of care orders for [facility].   Expected Discharge Date:            Expected Discharge Plan:  Home w Home Health Services  In-House Referral:  NA  Discharge planning Services  CM Consult  Post Acute Care Choice:  Home Health, Resumption of Svcs/PTA Provider Choice offered to:  Patient  DME Arranged:  N/A DME Agency:  NA  HH Arranged:  RN, PT HH Agency:  [facility]  Status of Service:  In process, will continue to follow  If discussed at Long Length of Stay Meetings, dates discussed:    Additional Comments:  Elliot Cousin, RN 09/04/2017, 12:09 PM

## 2017-09-04 NOTE — Progress Notes (Signed)
300 mL urine output after insertion of Foley catheter

## 2017-09-04 NOTE — Telephone Encounter (Signed)
Agree with assessment, the patient did go for evaluation and appears to have significant alteration in renal function.

## 2017-09-04 NOTE — Progress Notes (Signed)
Assumed care of patient at 1600. Agree with previous Nurse assessment.   

## 2017-09-05 DIAGNOSIS — A419 Sepsis, unspecified organism: Secondary | ICD-10-CM

## 2017-09-05 DIAGNOSIS — R0602 Shortness of breath: Secondary | ICD-10-CM | POA: Diagnosis not present

## 2017-09-05 LAB — CBC
HCT: 30.4 % — ABNORMAL LOW (ref 36.0–46.0)
HEMOGLOBIN: 9.7 g/dL — AB (ref 12.0–15.0)
MCH: 29.2 pg (ref 26.0–34.0)
MCHC: 31.9 g/dL (ref 30.0–36.0)
MCV: 91.6 fL (ref 78.0–100.0)
Platelets: 232 10*3/uL (ref 150–400)
RBC: 3.32 MIL/uL — ABNORMAL LOW (ref 3.87–5.11)
RDW: 14.4 % (ref 11.5–15.5)
WBC: 10.4 10*3/uL (ref 4.0–10.5)

## 2017-09-05 LAB — URIC ACID: URIC ACID, SERUM: 6.9 mg/dL — AB (ref 2.3–6.6)

## 2017-09-05 LAB — BASIC METABOLIC PANEL
ANION GAP: 11 (ref 5–15)
BUN: 39 mg/dL — AB (ref 6–20)
CO2: 23 mmol/L (ref 22–32)
Calcium: 8.2 mg/dL — ABNORMAL LOW (ref 8.9–10.3)
Chloride: 106 mmol/L (ref 101–111)
Creatinine, Ser: 1.23 mg/dL — ABNORMAL HIGH (ref 0.44–1.00)
GFR calc Af Amer: 50 mL/min — ABNORMAL LOW (ref >60)
GFR, EST NON AFRICAN AMERICAN: 43 mL/min — AB (ref >60)
GLUCOSE: 143 mg/dL — AB (ref 65–99)
Potassium: 3.1 mmol/L — ABNORMAL LOW (ref 3.5–5.1)
Sodium: 140 mmol/L (ref 135–145)

## 2017-09-05 MED ORDER — POTASSIUM CHLORIDE CRYS ER 20 MEQ PO TBCR
40.0000 meq | EXTENDED_RELEASE_TABLET | ORAL | Status: AC
  Administered 2017-09-05 (×2): 40 meq via ORAL
  Filled 2017-09-05 (×2): qty 2

## 2017-09-05 MED ORDER — DICLOFENAC SODIUM 1 % TD GEL
2.0000 g | Freq: Four times a day (QID) | TRANSDERMAL | Status: DC
  Administered 2017-09-05 – 2017-09-08 (×12): 2 g via TOPICAL
  Filled 2017-09-05: qty 100

## 2017-09-05 MED ORDER — IBUPROFEN 200 MG PO TABS: 400.0000 mg | ORAL_TABLET | ORAL | Status: DC | PRN

## 2017-09-05 NOTE — [place] FL2 (Signed)
[place] MEDICAID FL2 LEVEL OF CARE SCREENING TOOL     IDENTIFICATION  Patient Name: Jillian Fisher Birthdate: Jul 22, 1945 Sex: female Admission Date (Current Location): 09/02/2017  [facility] [place] and [place] Number:  Producer, television/film/video and Address:  [facility],  [address], [place] [address]      Provider Number: [id number]  Attending Physician Name and Address:  Calvert Cantor, MD  Relative Name and Phone Number:       Current Level of Care: Hospital Recommended Level of Care: Skilled Nursing Facility Prior Approval Number:    Date Approved/Denied:   PASRR Number: [id number]  Discharge Plan: SNF    Current Diagnoses: Patient Active Problem List   Diagnosis Date Noted  . Elevated lactic acid level 09/02/2017  . AKI (acute kidney injury) (HCC) 09/02/2017  . Dehydration 09/02/2017  . SIRS (systemic inflammatory response syndrome) (HCC) 09/02/2017  . Acute encephalopathy 09/02/2017  . Trigeminal neuralgia 07/07/2017  . Right leg weakness 02/25/2016  . Lower urinary tract infectious disease 02/25/2016  . PAF (paroxysmal atrial fibrillation) (HCC) 02/25/2016  . Weakness of right lower extremity 02/25/2016  . Gait instability 02/24/2016  . Multiple sclerosis (HCC) 01/25/2013  . Abnormality of gait 01/25/2013  . Neurogenic bladder 01/25/2013    Orientation RESPIRATION BLADDER Height & Weight     Self, Situation, Place  Normal Indwelling catheter Weight: 181 lb 3.5 oz (82.2 kg) Height:  5\' 4"  (162.6 cm)  BEHAVIORAL SYMPTOMS/MOOD NEUROLOGICAL BOWEL NUTRITION STATUS      Incontinent Diet(see dc summary)  AMBULATORY STATUS COMMUNICATION OF NEEDS Skin   Extensive Assist Verbally Normal                       Personal Care Assistance Level of Assistance  Bathing, Feeding, Dressing Bathing Assistance: Maximum assistance Feeding assistance: Independent Dressing Assistance: Maximum assistance     Functional Limitations Info  Sight, Hearing,  Speech Sight Info: Adequate Hearing Info: Adequate Speech Info: Adequate    SPECIAL CARE FACTORS FREQUENCY  OT (By licensed OT), PT (By licensed PT)     PT Frequency: 5x/week OT Frequency: 5x/week            Contractures      Additional Factors Info  Code Status, Allergies Code Status Info: Full Code Allergies Info: Sulfa Antibiotics           Current Medications (09/05/2017):  This is the current hospital active medication list Current Facility-Administered Medications  Medication Dose Route Frequency Provider Last Rate Last Dose  . acetaminophen (TYLENOL) tablet 650 mg  650 mg Oral Q6H PRN Therisa Doyne, MD       Or  . acetaminophen (TYLENOL) suppository 650 mg  650 mg Rectal Q6H PRN Doutova, Anastassia, MD      . apixaban (ELIQUIS) tablet 5 mg  5 mg Oral BID Doutova, Anastassia, MD   5 mg at 09/05/17 0900  . diltiazem (CARDIZEM CD) 24 hr capsule 240 mg  240 mg Oral Daily Doutova, Anastassia, MD   240 mg at 09/05/17 0900  . feeding supplement (ENSURE ENLIVE) (ENSURE ENLIVE) liquid 237 mL  237 mL Oral BID BM Rizwan, Saima, MD   237 mL at 09/05/17 0900  . gabapentin (NEURONTIN) capsule 300 mg  300 mg Oral QID Calvert Cantor, MD   300 mg at 09/05/17 1435  . hydrALAZINE (APRESOLINE) injection 5 mg  5 mg Intravenous Q4H PRN Audrea Muscat T, NP   5 mg at 09/04/17 0517  .  hydrALAZINE (APRESOLINE) tablet 25 mg  25 mg Oral Q8H Rizwan, Saima, MD   25 mg at 09/05/17 1435  . labetalol (NORMODYNE,TRANDATE) injection 10 mg  10 mg Intravenous Q2H PRN Calvert Cantor, MD   10 mg at 09/03/17 1857  . levothyroxine (SYNTHROID, LEVOTHROID) tablet 125 mcg  125 mcg Oral QAC breakfast Calvert Cantor, MD   125 mcg at 09/05/17 0852  . metoprolol tartrate (LOPRESSOR) tablet 25 mg  25 mg Oral BID Calvert Cantor, MD   25 mg at 09/05/17 0900  . ondansetron (ZOFRAN) tablet 4 mg  4 mg Oral Q6H PRN Therisa Doyne, MD       Or  . ondansetron (ZOFRAN) injection 4 mg  4 mg Intravenous Q6H PRN Doutova,  Anastassia, MD      . OXcarbazepine (TRILEPTAL) tablet 150 mg  150 mg Oral BID Calvert Cantor, MD   150 mg at 09/05/17 0900  . pantoprazole (PROTONIX) EC tablet 40 mg  40 mg Oral Daily Calvert Cantor, MD   40 mg at 09/05/17 0900  . rosuvastatin (CRESTOR) tablet 10 mg  10 mg Oral q1800 Therisa Doyne, MD   10 mg at 09/04/17 1852  . sodium bicarbonate 100 mEq in dextrose 5 % 1,000 mL infusion   Intravenous Continuous Calvert Cantor, MD 75 mL/hr at 09/04/17 2332       Discharge Medications: Please see discharge summary for a list of discharge medications.  Relevant Imaging Results:  Relevant Lab Results:   Additional Information SSN  [id number]  Antionette Poles, LCSW

## 2017-09-05 NOTE — Evaluation (Signed)
Occupational Therapy Evaluation Patient Details Name: Jillian Fisher MRN: [id number] DOB: January 07, 1946 Today's Date: 09/05/2017    History of Present Illness Pt admitted through ED with lethargy, fever, AKI, dehydration and with hx of MS and a-fib   Clinical Impression   Pt admitted with lethargy. Pt currently with functional limitations due to the deficits listed below (see OT Problem List).  Pt will benefit from skilled OT to increase their safety and independence with ADL and functional mobility for ADL to facilitate discharge to venue listed below.      Follow Up Recommendations  SNF;Supervision/Assistance - 24 hour;Home health OT(depending on family A at home)    Equipment Recommendations  None recommended by OT    Recommendations for Other Services       Precautions / Restrictions Precautions Precautions: Fall Restrictions Weight Bearing Restrictions: No      Mobility Bed Mobility               General bed mobility comments: pt in chair  Transfers Overall transfer level: Needs assistance Equipment used: Rolling walker (2 wheeled) Transfers: Sit to/from Stand Sit to Stand: Mod assist                  ADL either performed or assessed with clinical judgement   ADL Overall ADL's : Needs assistance/impaired Eating/Feeding: Minimal assistance;Sitting   Grooming: Minimal assistance;Sitting   Upper Body Bathing: Minimal assistance;Sitting   Lower Body Bathing: Maximal assistance;Sit to/from stand;Cueing for sequencing;Cueing for safety   Upper Body Dressing : Minimal assistance;Sitting   Lower Body Dressing: Minimal assistance;Maximal assistance;Sit to/from stand                 General ADL Comments: pt very quiet/flat during OT eval. No family present.  Pt agreeable to staying in chair                  Pertinent Vitals/Pain Pain Assessment: No/denies pain     Hand Dominance     Extremity/Trunk Assessment Upper Extremity  Assessment Upper Extremity Assessment: Generalized weakness           Communication Communication Communication: No difficulties   Cognition Arousal/Alertness: Awake/alert Behavior During Therapy: Flat affect Overall Cognitive Status: No family/caregiver present to determine baseline cognitive functioning                                                Home Living Family/patient expects to be discharged to:: Private residence Living Arrangements: Children(Pt states she lives with her dtr) Available Help at Discharge: Available PRN/intermittently;Family Type of Home: House Home Access: Level entry     Home Layout: One level     Bathroom Shower/Tub: Chief Strategy Officer: Standard     Home Equipment: Environmental consultant - 2 wheels;Cane - single point;Bedside commode;Shower seat;Shower seat - built in          Prior Functioning/Environment Level of Independence: Needs assistance  Gait / Transfers Assistance Needed: Pt states she ambulates in home without assist or assist device ADL's / Homemaking Assistance Needed: Assist of dtr   Comments: Pt states she lives with dtr who works during the day        OT Problem List: Decreased strength;Decreased activity tolerance;Impaired balance (sitting and/or standing)      OT Treatment/Interventions: Self-care/ADL training;Patient/family education;DME and/or AE instruction    OT  Goals(Current goals can be found in the care plan section) Acute Rehab OT Goals Patient Stated Goal: Regain IND OT Goal Formulation: With patient Time For Goal Achievement: 09/19/17 Potential to Achieve Goals: Good  OT Frequency: Min 2X/week   Barriers to D/C:            Co-evaluation              AM-PAC PT "6 Clicks" Daily Activity     Outcome Measure Help from another person eating meals?: A Little Help from another person taking care of personal grooming?: A Little Help from another person toileting, which  includes using toliet, bedpan, or urinal?: A Lot Help from another person bathing (including washing, rinsing, drying)?: A Lot Help from another person to put on and taking off regular upper body clothing?: A Little Help from another person to put on and taking off regular lower body clothing?: A Lot 6 Click Score: 15   End of Session Equipment Utilized During Treatment: Rolling walker Nurse Communication: Mobility status  Activity Tolerance: Patient limited by lethargy Patient left: in chair  OT Visit Diagnosis: Unsteadiness on feet (R26.81);History of falling (Z91.81);Other abnormalities of gait and mobility (R26.89);Muscle weakness (generalized) (M62.81)                Time: 1610-9604 OT Time Calculation (min): 14 min Charges:  OT General Charges $OT Visit: 1 Visit OT Evaluation $OT Eval Moderate Complexity: 1 Mod G-Codes:     Lise Auer, [place] [phone]  Einar Crow D 09/05/2017, 1:58 PM

## 2017-09-05 NOTE — Telephone Encounter (Signed)
Got a message from the daughter, I am aware this patient is in the hospital.  I do not have a call back number, not sure which daughter called.

## 2017-09-05 NOTE — Telephone Encounter (Signed)
Pts daughter called stating that the pt has been taking off of tecfidera, and is currently in room 1431 at [facility].

## 2017-09-05 NOTE — Clinical Social Work Note (Signed)
Clinical Social Work Assessment  Patient Details  Name: Jillian Fisher MRN: [id number] Date of Birth: 1945/08/02  Date of referral:  09/05/17               Reason for consult:  Facility Placement                Permission sought to share information with:  Facility Medical sales representative, Family Supports Permission granted to share information::  Yes, Verbal Permission Granted  Name::     Analyn Montag  Agency::     Relationship::  Daughter   Contact Information:  [phone]  Housing/Transportation Living arrangements for the past 2 months:  Single Family Home Source of Information:  Patient Patient Interpreter Needed:  None Criminal Activity/Legal Involvement Pertinent to Current Situation/Hospitalization:  No - Comment as needed Significant Relationships:  Adult Children, Spouse Lives with:  Spouse Do you feel safe going back to the place where you live?  (PT recommending SNF) Need for family participation in patient care:  Yes (Comment)  Care giving concerns:  Patient from home with spouse. Patient reported that prior to MS flare up she was independent with ambulation and ADLs. Patient admitted with chief complaint "fever". PT recommending SNF for ST rehab.   Social Worker assessment / plan:  CSW spoke with patient at bedside regarding PT recommendation for SNF for ST rehab, patient agreeable. CSW explained SNF placement process and insurance authorization, patient verbalized some understanding. Patient granted CSW verbal permission to speak with her daughter regarding discharge planning. CSW agreed to follow up with patient's daughter.  CSW contacted patient's daughter to discuss patient's discharge planning, no answer. CSW left voicemail requesting return phone call.  CSW completed patient's FL2 and will follow up with bed offers.  CSW will continue to follow and assist with discharge planning.  Employment status:  Retired Database administrator PT  Recommendations:  Skilled Nursing Facility Information / Referral to community resources:  Skilled Nursing Facility  Patient/Family's Response to care:  Patient appreciative of CSW assistance with discharge planning.  Patient/Family's Understanding of and Emotional Response to Diagnosis, Current Treatment, and Prognosis:  Patient presented calm and verbalized some understanding of current treatment plan. Patient reported that her daughter takes care of things for her. Patient reported that she is agreeable to ST rehab and SNF prior to returning home.   Emotional Assessment Appearance:  Appears stated age Attitude/Demeanor/Rapport:  Other(Coperative) Affect (typically observed):  Appropriate, Calm Orientation:  Oriented to Self, Oriented to Place, Oriented to Situation Alcohol / Substance use:  Not Applicable Psych involvement (Current and /or in the community):  No (Comment)  Discharge Needs  Concerns to be addressed:  Care Coordination Readmission within the last 30 days:  No Current discharge risk:  Physical Impairment Barriers to Discharge:  Continued Medical Work up, [employer], LCSW 09/05/2017, 4:55 PM

## 2017-09-05 NOTE — Progress Notes (Signed)
Elevated temp 102, tylenol given, patient C/O right knee pain as well, Dr. Butler Denmark notified orders written for labs and med. Temp down to 100 after tylenol and knee pain better as well. Will continue to assess patient. Updated daughter sandra at the bedside. Will continue to monitor patient.

## 2017-09-05 NOTE — Progress Notes (Signed)
  Speech Language Pathology Treatment: Dysphagia  Patient Details Name: Jillian Fisher MRN: [id number] DOB: 04-15-1945 Today's Date: 09/05/2017 Time: 1350-1409 SLP Time Calculation (min) (ACUTE ONLY): 19 min  Assessment / Plan / Recommendation Clinical Impression  Pt seen to assess po tolerance and readiness for dietary advancement.  Pt tongue observed to have whitish spots on right side of tongue and few spots bilateral buccal regions that may be concerning for oral candidiasis.  Showed pt and she denies knowledge of this previously.  Assisted pt with oral care and advised her to its importance for pulmonary health.  Clearance of viscous secretions occurred with use of toothbrush and oral swish/expectoration.   Pt also has a small sliding hiatal hernia per imaging study.   Pt passed 3 ounce water test without indication of aspiration - She did demonstrate minimal increase dyspnea.   Observed pt consuming sweet potato and water without difficulties.  She does have h/o trigeminal neuralgia therefore recommend continue dys3/thin.   CXR negative upon admit -   No SLP follow up indicated as pt tolerating po intake.  RN made aware of possible/concern for oral candidiasis.  Thanks for allowing me to help care for this pt.    HPI HPI: Patient is a 72 y.o. female with advanced MS, neurogenic bladder, recurrent UTI's, atrial fibrillation, HTN, hyperlipidemia and hypothyroidism, who presented to [facility] ER for evaluation of lethargy that had been ongoing for past 5 days, with low grade fevers.       SLP Plan  All goals met       Recommendations  Diet recommendations: Dysphagia 3 (mechanical soft);Thin liquid Liquids provided via: Straw;Cup Medication Administration: Whole meds with liquid Supervision: Patient able to self feed Compensations: Minimize environmental distractions;Small sips/bites;Slow rate Postural Changes and/or Swallow Maneuvers: Seated upright 90 degrees;Upright 30-60 min  after meal                Oral Care Recommendations: Oral care BID Follow up Recommendations: Home health SLP;Skilled Nursing facility SLP Visit Diagnosis: Dysphagia, oropharyngeal phase (R13.12) Plan: All goals met       GO                Jillian Fisher 09/05/2017, 2:20 PM  Jillian Fisher, [place] [facility] SLP [phone]

## 2017-09-05 NOTE — Progress Notes (Signed)
PROGRESS NOTE    Jillian Fisher   UJW:[id number]  DOB: [birth year]  DOA: 09/02/2017 PCP: Clovis Riley, L.August Saucer, MD   Brief Narrative:  Jillian Fisher  is a 72 y.o. female with advanced MS, neurogenic bladder, recurrent UTIs, atrial fibrillation on anticoagulation,hypertension, hyperlipidemia and hypothyroidism, presented to [facility] emergency room for evaluation of lethargy that had been ongoing for past for 5 days along with low-grade fevers.   History obtained from husband [name]. She was not eating or drinking well for 2-3 days, has been very sleepy & has not been walking. He asked me to speak with their daughter for further info.   I then spoke with her daughter who states she would drink water from her in the AM and she could tell she was thirsty but she cannot spend the day with her and she has not been over drinking well. She did have diarrhea Wed morning and non since. Her urine ouput has decreased and her depends have not been wet as before.     She was scheduled to get 5 days of IV Solu-Medrol (Dr Anne Hahn) for presumed MS exacerbation, when the home health nurse found her to be altered and with a fever of 100.7.  EMS gave her 500 cc of IVF.  On-call neurologist was called by [facility] & recommended evaluation in the emergency room.  Given Vanc and Zosyn in ED. UA neg for infection. BUN/ Cr 80 and 3.22, LA 2.02> 3.26 > 1.1. Procalcitonin 1.07. WBC 19.7   Subjective: Sitting up eating. No complaints.   Assessment & Plan:   Principal Problem:   AKI (acute kidney injury)   Elevated lactic acid level   Dehydration Metabolic acidosis Neurogenic bladder - appears prerenal-  Cr has improved considerably with IVF -   Cr 3.22 >> 1.23 - changed to Bicarb infusion due to acidosis - continue - good urine output but scan showing enlarged bladder and chronic hydronephrosis- 300 cc urine out after foley placed- cont foley- d/c Ditropan as bladder is distended and she has  hydronephrosis-  Active Problems: Fever ~ 100 / leukocytosis/  SIRS (systemic inflammatory response syndrome)  - no source found - UA neg, no resp symptoms- CXR neg- no GI symptoms- no rash - she does have mild diffuse abdominal tenderness - -  CT of the abdomen reviewed- nothing significant other than enlarged bladder- report below- place foley - stopped antibiotics 6/1- WBC continues to improve- ? Viral infection - 6/3 temps down to 99 degrees  Hypokalemia - replace    Multiple sclerosis /   Acute encephalopathy - more alert than what was described in ED (was quite lethargic) - at baseline she is able to perform ADLs per her daughter [name] - MRI per Neurology see report below which I have reviewed - 6/2 mental status significantly improved-- I have spoken with Dr Amada Jupiter 6/2 who does not feel she needs any IV steroids and suspects recrudence of symptoms due to possibly infection - resumed Neurontin - neuro would like imaging of the spine as she has not had it in many years     PAF (paroxysmal atrial fibrillation)  Hypertensive urgency- SBP 190-200s now -   Metoprolol and Cardizem - needing IV Hydralazine and Labetalol PRN - added 25 mg oral Hydralazine TID- now SBP in 120-130s  - cont Eliquis   Trigeminal neuralgia - cont Trileptal    DVT prophylaxis: Eliquis Code Status: Full code Family Communication:  Disposition Plan: SNF on d/c- possibly tomorrow Consultants:  neurology Procedures:    Antimicrobials:  Anti-infectives (From admission, onward)   Start     Dose/Rate Route Frequency Ordered Stop   09/04/17 1800  vancomycin (VANCOCIN) IVPB 1000 mg/200 mL premix  Status:  Discontinued     1,000 mg 200 mL/hr over 60 Minutes Intravenous Every 48 hours 09/03/17 0821 09/03/17 0854   09/03/17 1400  vancomycin (VANCOCIN) 500 mg in sodium chloride 0.9 % 100 mL IVPB  Status:  Discontinued     500 mg 100 mL/hr over 60 Minutes Intravenous Every 36 hours 09/02/17 2139  09/03/17 0821   09/03/17 0900  piperacillin-tazobactam (ZOSYN) IVPB 3.375 g  Status:  Discontinued     3.375 g 12.5 mL/hr over 240 Minutes Intravenous Every 8 hours 09/03/17 0821 09/03/17 0854   09/03/17 0200  piperacillin-tazobactam (ZOSYN) IVPB 2.25 g  Status:  Discontinued     2.25 g 100 mL/hr over 30 Minutes Intravenous STAT 09/02/17 2202 09/02/17 2203   09/03/17 0200  piperacillin-tazobactam (ZOSYN) IVPB 2.25 g  Status:  Discontinued     2.25 g 100 mL/hr over 30 Minutes Intravenous Every 8 hours 09/02/17 2201 09/03/17 0821   09/02/17 1745  piperacillin-tazobactam (ZOSYN) IVPB 2.25 g     2.25 g 100 mL/hr over 30 Minutes Intravenous STAT 09/02/17 1736 09/02/17 1808   09/02/17 1730  piperacillin-tazobactam (ZOSYN) IVPB 3.375 g  Status:  Discontinued     3.375 g 100 mL/hr over 30 Minutes Intravenous  Once 09/02/17 1716 09/02/17 1736   09/02/17 1730  vancomycin (VANCOCIN) IVPB 1000 mg/200 mL premix     1,000 mg 200 mL/hr over 60 Minutes Intravenous  Once 09/02/17 1716 09/02/17 1840       Objective: Vitals:   09/04/17 2055 09/05/17 0159 09/05/17 0554 09/05/17 0855  BP: 111/65 125/64 (!) 135/52 (!) 137/53  Pulse: 65 73 73 82  Resp: 16 16 16 18   Temp: 99.4 F (37.4 C) 99.2 F (37.3 C) 99.2 F (37.3 C) 99.2 F (37.3 C)  TempSrc: Oral Oral Oral Oral  SpO2: 97% 97% 99% 97%  Weight:      Height:        Intake/Output Summary (Last 24 hours) at 09/05/2017 1359 Last data filed at 09/05/2017 1024 Gross per 24 hour  Intake 1811.25 ml  Output 1720 ml  Net 91.25 ml   Filed Weights   09/02/17 1635 09/03/17 0100  Weight: 79.4 kg (175 lb) 82.2 kg (181 lb 3.5 oz)    Examination: General exam: Appears comfortable  HEENT: PERRLA, oral mucosa moist, no sclera icterus or thrush Respiratory system: Clear to auscultation. Respiratory effort normal. Cardiovascular system: S1 & S2 heard, RRR.   Gastrointestinal system: Abdomen soft, no tenderness, nondistended. Normal bowel sound. No  organomegaly Neuro: 4/5 strength in legs, 5/5 in arms- very alert Extremities: No cyanosis, clubbing or edema Skin: No rashes or ulcers Psychiatry:  Normal mood and affect    Data Reviewed: I have personally reviewed following labs and imaging studies  CBC: Recent Labs  Lab 09/02/17 1644 09/03/17 0329 09/04/17 0722 09/05/17 0503  WBC 19.7* 18.8* 12.8* 10.4  NEUTROABS 16.3*  --   --   --   HGB 11.4* 11.2* 11.7* 9.7*  HCT 35.6* 35.0* 36.9 30.4*  MCV 92.7 91.9 92.0 91.6  PLT 248 266 253 232   Basic Metabolic Panel: Recent Labs  Lab 09/02/17 1644 09/03/17 0329 09/04/17 0338 09/04/17 0722 09/05/17 0503  NA 142 145  --  140 140  K 4.2 3.6  --  3.8 3.1*  CL 105 111  --  108 106  CO2 26 19*  --  19* 23  GLUCOSE 161* 147*  --  136* 143*  BUN 80* 63*  --  47* 39*  CREATININE 3.22* 1.96* 1.42* 1.46* 1.23*  CALCIUM 8.6* 8.4*  --  8.9 8.2*  MG  --  2.1  --   --   --   PHOS  --  3.7  --   --   --    GFR: Estimated Creatinine Clearance: 43.5 mL/min (A) (by C-G formula based on SCr of 1.23 mg/dL (H)). Liver Function Tests: Recent Labs  Lab 09/02/17 1644 09/03/17 0329  AST 57* 87*  ALT 37 62*  ALKPHOS 52 62  BILITOT 0.5 0.7  PROT 6.4* 6.7  ALBUMIN 2.8* 2.9*   No results for input(s): LIPASE, AMYLASE in the last 168 hours. Recent Labs  Lab 09/03/17 0037  AMMONIA 14   Coagulation Profile: No results for input(s): INR, PROTIME in the last 168 hours. Cardiac Enzymes: Recent Labs  Lab 09/03/17 0037  CKTOTAL 454*   BNP (last 3 results) No results for input(s): PROBNP in the last 8760 hours. HbA1C: No results for input(s): HGBA1C in the last 72 hours. CBG: No results for input(s): GLUCAP in the last 168 hours. Lipid Profile: No results for input(s): CHOL, HDL, LDLCALC, TRIG, CHOLHDL, LDLDIRECT in the last 72 hours. Thyroid Function Tests: Recent Labs    09/03/17 0329  TSH 3.017   Anemia Panel: No results for input(s): VITAMINB12, FOLATE, FERRITIN, TIBC,  IRON, RETICCTPCT in the last 72 hours. Urine analysis:    Component Value Date/Time   COLORURINE YELLOW 09/02/2017 1650   APPEARANCEUR CLEAR 09/02/2017 1650   APPEARANCEUR Clear 08/08/2017 0820   LABSPEC 1.010 09/02/2017 1650   PHURINE 6.0 09/02/2017 1650   GLUCOSEU NEGATIVE 09/02/2017 1650   HGBUR NEGATIVE 09/02/2017 1650   BILIRUBINUR NEGATIVE 09/02/2017 1650   BILIRUBINUR Negative 08/08/2017 0820   KETONESUR NEGATIVE 09/02/2017 1650   PROTEINUR NEGATIVE 09/02/2017 1650   NITRITE NEGATIVE 09/02/2017 1650   LEUKOCYTESUR NEGATIVE 09/02/2017 1650   LEUKOCYTESUR Negative 08/08/2017 0820   Sepsis Labs: @LABRCNTIP (procalcitonin:4,lacticidven:4) ) Recent Results (from the past 240 hour(s))  Blood Culture (routine x 2)     Status: None (Preliminary result)   Collection Time: 09/02/17  4:44 PM  Result Value Ref Range Status   Specimen Description   Final    BLOOD LEFT FOREARM Performed at [facility], [address] [place], [place] [address]    Special Requests   Final    BOTTLES DRAWN AEROBIC AND ANAEROBIC Blood Culture results may not be optimal due to an excessive volume of blood received in culture bottles Performed at [facility], [address] [place], [place] [address]    Culture   Final    NO GROWTH 2 DAYS Performed at [facility], [address], [place], [place] [address]    Report Status PENDING  Incomplete  Blood Culture (routine x 2)     Status: None (Preliminary result)   Collection Time: 09/02/17  4:51 PM  Result Value Ref Range Status   Specimen Description   Final    BLOOD LEFT ANTECUBITAL Performed at [facility], [address] [place], [place] [address]    Special Requests   Final    BOTTLES DRAWN AEROBIC AND ANAEROBIC Blood Culture adequate volume Performed at [facility], [address] [place],  [place] [address]    Culture   Final    NO GROWTH 2  DAYS Performed at [facility], [address], [place], [place] [address]    Report Status PENDING  Incomplete  Respiratory Panel by PCR     Status: None   Collection Time: 09/03/17 12:50 AM  Result Value Ref Range Status   Adenovirus NOT DETECTED NOT DETECTED Final   Coronavirus 229E NOT DETECTED NOT DETECTED Final   Coronavirus HKU1 NOT DETECTED NOT DETECTED Final   Coronavirus NL63 NOT DETECTED NOT DETECTED Final   Coronavirus OC43 NOT DETECTED NOT DETECTED Final   Metapneumovirus NOT DETECTED NOT DETECTED Final   Rhinovirus / Enterovirus NOT DETECTED NOT DETECTED Final   Influenza A NOT DETECTED NOT DETECTED Final   Influenza B NOT DETECTED NOT DETECTED Final   Parainfluenza Virus 1 NOT DETECTED NOT DETECTED Final   Parainfluenza Virus 2 NOT DETECTED NOT DETECTED Final   Parainfluenza Virus 3 NOT DETECTED NOT DETECTED Final   Parainfluenza Virus 4 NOT DETECTED NOT DETECTED Final   Respiratory Syncytial Virus NOT DETECTED NOT DETECTED Final   Bordetella pertussis NOT DETECTED NOT DETECTED Final   Chlamydophila pneumoniae NOT DETECTED NOT DETECTED Final   Mycoplasma pneumoniae NOT DETECTED NOT DETECTED Final    Comment: Performed at [facility], [address], [place], [place] [address]  MRSA PCR Screening     Status: None   Collection Time: 09/03/17  1:13 AM  Result Value Ref Range Status   MRSA by PCR NEGATIVE NEGATIVE Final    Comment:        The GeneXpert MRSA Assay (FDA approved for NASAL specimens only), is one component of a comprehensive MRSA colonization surveillance program. It is not intended to diagnose MRSA infection nor to guide or monitor treatment for MRSA infections. Performed at [facility], [address], [place], [place] [address]          Radiology Studies: No results found.    Scheduled Meds: . apixaban  5 mg Oral BID  . diltiazem  240 mg Oral Daily  . feeding supplement (ENSURE ENLIVE)  237 mL Oral BID  BM  . gabapentin  300 mg Oral QID  . hydrALAZINE  25 mg Oral Q8H  . levothyroxine  125 mcg Oral QAC breakfast  . metoprolol tartrate  25 mg Oral BID  . OXcarbazepine  150 mg Oral BID  . pantoprazole  40 mg Oral Daily  . rosuvastatin  10 mg Oral q1800   Continuous Infusions: .  sodium bicarbonate  infusion 1000 mL 75 mL/hr at 09/04/17 2332     LOS: 3 days    Time spent in minutes: 40     Calvert Cantor, MD [facility] Pager: [email or web address] Password [email or web address] 09/05/2017, 1:59 PM

## 2017-09-06 ENCOUNTER — Encounter (HOSPITAL_COMMUNITY): Payer: Self-pay | Admitting: Radiology

## 2017-09-06 ENCOUNTER — Inpatient Hospital Stay (HOSPITAL_COMMUNITY): Payer: Medicare HMO

## 2017-09-06 LAB — BASIC METABOLIC PANEL
Anion gap: 10 (ref 5–15)
BUN: 23 mg/dL — ABNORMAL HIGH (ref 6–20)
CALCIUM: 8.3 mg/dL — AB (ref 8.9–10.3)
CO2: 23 mmol/L (ref 22–32)
CREATININE: 0.93 mg/dL (ref 0.44–1.00)
Chloride: 108 mmol/L (ref 101–111)
Glucose, Bld: 141 mg/dL — ABNORMAL HIGH (ref 65–99)
Potassium: 4.6 mmol/L (ref 3.5–5.1)
SODIUM: 141 mmol/L (ref 135–145)

## 2017-09-06 LAB — CBC
HCT: 31.4 % — ABNORMAL LOW (ref 36.0–46.0)
Hemoglobin: 10.3 g/dL — ABNORMAL LOW (ref 12.0–15.0)
MCH: 29.3 pg (ref 26.0–34.0)
MCHC: 32.8 g/dL (ref 30.0–36.0)
MCV: 89.5 fL (ref 78.0–100.0)
PLATELETS: 253 10*3/uL (ref 150–400)
RBC: 3.51 MIL/uL — AB (ref 3.87–5.11)
RDW: 14.2 % (ref 11.5–15.5)
WBC: 11.1 10*3/uL — AB (ref 4.0–10.5)

## 2017-09-06 MED ORDER — IOPAMIDOL (ISOVUE-300) INJECTION 61%
INTRAVENOUS | Status: AC
  Administered 2017-09-06: 16:00:00
  Filled 2017-09-06: qty 30

## 2017-09-06 MED ORDER — IOPAMIDOL (ISOVUE-300) INJECTION 61%
15.0000 mL | Freq: Two times a day (BID) | INTRAVENOUS | Status: DC | PRN
  Administered 2017-09-06: 30 mL via ORAL
  Filled 2017-09-06: qty 30

## 2017-09-06 MED ORDER — METOPROLOL TARTRATE 50 MG PO TABS
50.0000 mg | ORAL_TABLET | Freq: Two times a day (BID) | ORAL | Status: DC
  Administered 2017-09-06 – 2017-09-08 (×4): 50 mg via ORAL
  Filled 2017-09-06 (×4): qty 1

## 2017-09-06 MED ORDER — HYDRALAZINE HCL 50 MG PO TABS: 50.0000 mg | ORAL_TABLET | Freq: Three times a day (TID) | ORAL | Status: DC

## 2017-09-06 MED ORDER — IBUPROFEN 200 MG PO TABS: 400.0000 mg | ORAL_TABLET | ORAL | Status: DC | PRN

## 2017-09-06 MED ORDER — IOPAMIDOL (ISOVUE-300) INJECTION 61%
INTRAVENOUS | Status: AC
  Filled 2017-09-06: qty 100

## 2017-09-06 MED ORDER — IOPAMIDOL (ISOVUE-300) INJECTION 61%
100.0000 mL | Freq: Once | INTRAVENOUS | Status: AC | PRN
Start: 2017-09-06 — End: 2017-09-06
  Administered 2017-09-06: 100 mL via INTRAVENOUS

## 2017-09-06 MED ORDER — GADOBENATE DIMEGLUMINE 529 MG/ML IV SOLN
20.0000 mL | Freq: Once | INTRAVENOUS | Status: AC | PRN
  Administered 2017-09-06: 17 mL via INTRAVENOUS

## 2017-09-06 MED ORDER — HYDRALAZINE HCL 25 MG PO TABS
25.0000 mg | ORAL_TABLET | Freq: Three times a day (TID) | ORAL | Status: DC
  Administered 2017-09-06 – 2017-09-08 (×7): 25 mg via ORAL
  Filled 2017-09-06 (×7): qty 1

## 2017-09-06 NOTE — Progress Notes (Signed)
Occupational Therapy Treatment Patient Details Name: SHALETTE DEEKEN MRN: [id number] DOB: [birth year] Today's Date: 09/06/2017    History of present illness Pt admitted through ED with lethargy, fever, AKI, dehydration and with hx of MS and a-fib   OT comments  Pt continues with flat affect. Pt seems agreeable to SNF  Follow Up Recommendations  SNF;Supervision/Assistance - 24 hour    Equipment Recommendations  None recommended by OT    Recommendations for Other Services      Precautions / Restrictions Precautions Precautions: Fall Restrictions Weight Bearing Restrictions: No       Mobility Bed Mobility   Bed Mobility: Rolling Rolling: Max assist         General bed mobility comments: initiated sitting EOB and pt declined  Transfers            NT              ADL either performed or assessed with clinical judgement   ADL Overall ADL's : Needs assistance/impaired     Grooming: Minimal assistance;Bed level Grooming Details (indicate cue type and reason): Pt agreed to do this in bed.  Pt would not agree to get OOB.                                 General ADL Comments: pt did agree to BUE exercise. Goal added. Pt declined OOB after lots of encouragement.  RN did come in and share pt going to MRI     Vision Patient Visual Report: No change from baseline     Perception     Praxis      Cognition Arousal/Alertness: Awake/alert Behavior During Therapy: Flat affect Overall Cognitive Status: No family/caregiver present to determine baseline cognitive functioning                                          Exercises Shoulder Exercises Shoulder Flexion: AROM;20 reps;Both;Supine Elbow Flexion: AROM;20 reps;Supine Elbow Extension: AROM;20 reps;Supine Wrist Flexion: AROM;Both Wrist Extension: AROM;Both Digit Composite Flexion: Both   Shoulder Instructions       General Comments      Pertinent Vitals/ Pain        Pain Assessment: No/denies pain  Home Living                                           Progress Toward Goals  OT Goals(current goals can now be found in the care plan section)  Progress towards OT goals: OT to reassess next treatment  ADL Goals Pt/caregiver will Perform Home Exercise Program: Right Upper extremity;Increased ROM;Increased strength;Left upper extremity;With Supervision  Plan Discharge plan remains appropriate             Activity Tolerance Patient limited by lethargy;Patient limited by fatigue   Patient Left in bed;with bed alarm set   Nurse Communication Mobility status        Time: 9147-8295 OT Time Calculation (min): 13 min  Charges: OT General Charges $OT Visit: 1 Visit OT Treatments $Self Care/Home Management : 8-22 mins  [place], [place] [phone]   Alba Cory 09/06/2017, 12:29 PM

## 2017-09-06 NOTE — Progress Notes (Addendum)
PROGRESS NOTE    Jillian Fisher   ONG:[id number]  DOB: 1945-05-13  DOA: 09/02/2017 PCP: Clovis Riley, L.August Saucer, MD   Brief Narrative:  Jillian Fisher  is a 72 y.o. female with advanced MS, neurogenic bladder, recurrent UTIs, atrial fibrillation on anticoagulation,hypertension, hyperlipidemia and hypothyroidism, presented to [facility] emergency room for evaluation of lethargy that had been ongoing for past for 5 days along with low-grade fevers.   History obtained from husband [name]. She was not eating or drinking well for 2-3 days, has been very sleepy & has not been walking. He asked me to speak with their daughter for further info.   I then spoke with her daughter who states she would drink water from her in the AM and she could tell she was thirsty but she cannot spend the day with her and she has not been over drinking well. She did have diarrhea Wed morning and non since. Her urine ouput has decreased and her depends have not been wet as before.     She was scheduled to get 5 days of IV Solu-Medrol (Dr Anne Hahn) for presumed MS exacerbation, when the home health nurse found her to be altered and with a fever of 100.7.  EMS gave her 500 cc of IVF.  On-call neurologist was called by [facility] & recommended evaluation in the emergency room.  Given Vanc and Zosyn in ED. UA neg for infection. BUN/ Cr 80 and 3.22, LA 2.02> 3.26 > 1.1. Procalcitonin 1.07. WBC 19.7   Subjective: Sitting up eating. No complaints.   Assessment & Plan:   Principal Problem:   AKI (acute kidney injury)   Elevated lactic acid level   Dehydration Metabolic acidosis Neurogenic bladder - appears prerenal-  Cr has improved considerably with IVF -   Cr 3.22 >> 1.23>> 0.93 - changed to Bicarb infusion due to acidosis - continue @ 75 cc /hr  - good urine output but scan showing enlarged bladder and chronic hydronephrosis- 300 cc urine out after foley placed on 6/2- cont foley- d/c Ditropan as bladder is distended and  she has hydronephrosis-  Active Problems: Fever ~ 100 / leukocytosis/  SIRS (systemic inflammatory response syndrome)  - no source found - UA neg, no resp symptoms- CXR neg- no GI symptoms- no rash - she does have mild diffuse abdominal tenderness - -  CT of the abdomen reviewed- nothing significant other than enlarged bladder- report below- placed foley - stopped antibiotics 6/1- WBC continues to improve- ? Viral infection- on NOAC so did not check for thrombosis - 6/3 - fever of 102 in the evening- has right knee pain but no swelling - blood cultures redrawn - 6/4- has some tenderness in LLQ and suprapubic area today- right knee is mildly tender with deep pressure but again, no swollen-  CT with contrast ordered of abd/pelvis r/o diverticulitis - WBC count continues to improve to 11 today     Multiple sclerosis /   Acute encephalopathy - more alert than what was described in ED (was quite lethargic) - at baseline she is able to perform ADLs per her daughter [name] - MRI per Neurology see report below which I have reviewed - 6/2 mental status significantly improved-- I have spoken with Dr Amada Jupiter 6/2 who does not feel she needs any IV steroids and suspects recrudence of symptoms due to possibly infection- I feel that it had to do with dehydrating and AKI - resumed Neurontin - neuro would like imaging of the spine as she  has not had it in many years - 6/4 - still able to sit up and feed herself today despite fevers  Addendum: MRIs reviewed- no acute findings    PAF (paroxysmal atrial fibrillation)  Hypertensive urgency- SBP 190-200s now -   Metoprolol and Cardizem - needing IV Hydralazine and Labetalol PRN - added 25 mg oral Hydralazine TID-  - BP still high- increase  Hydrazine to 50 TID - cont Eliquis  Hypokalemia - replace  Trigeminal neuralgia - cont Trileptal    DVT prophylaxis: Eliquis Code Status: Full code Family Communication:  Disposition Plan: SNF on d/c - f/u  fevers and workup Consultants:   neurology Procedures:    Antimicrobials:  Anti-infectives (From admission, onward)   Start     Dose/Rate Route Frequency Ordered Stop   09/04/17 1800  vancomycin (VANCOCIN) IVPB 1000 mg/200 mL premix  Status:  Discontinued     1,000 mg 200 mL/hr over 60 Minutes Intravenous Every 48 hours 09/03/17 0821 09/03/17 0854   09/03/17 1400  vancomycin (VANCOCIN) 500 mg in sodium chloride 0.9 % 100 mL IVPB  Status:  Discontinued     500 mg 100 mL/hr over 60 Minutes Intravenous Every 36 hours 09/02/17 2139 09/03/17 0821   09/03/17 0900  piperacillin-tazobactam (ZOSYN) IVPB 3.375 g  Status:  Discontinued     3.375 g 12.5 mL/hr over 240 Minutes Intravenous Every 8 hours 09/03/17 0821 09/03/17 0854   09/03/17 0200  piperacillin-tazobactam (ZOSYN) IVPB 2.25 g  Status:  Discontinued     2.25 g 100 mL/hr over 30 Minutes Intravenous STAT 09/02/17 2202 09/02/17 2203   09/03/17 0200  piperacillin-tazobactam (ZOSYN) IVPB 2.25 g  Status:  Discontinued     2.25 g 100 mL/hr over 30 Minutes Intravenous Every 8 hours 09/02/17 2201 09/03/17 0821   09/02/17 1745  piperacillin-tazobactam (ZOSYN) IVPB 2.25 g     2.25 g 100 mL/hr over 30 Minutes Intravenous STAT 09/02/17 1736 09/02/17 1808   09/02/17 1730  piperacillin-tazobactam (ZOSYN) IVPB 3.375 g  Status:  Discontinued     3.375 g 100 mL/hr over 30 Minutes Intravenous  Once 09/02/17 1716 09/02/17 1736   09/02/17 1730  vancomycin (VANCOCIN) IVPB 1000 mg/200 mL premix     1,000 mg 200 mL/hr over 60 Minutes Intravenous  Once 09/02/17 1716 09/02/17 1840       Objective: Vitals:   09/05/17 2003 09/06/17 0034 09/06/17 0406 09/06/17 0915  BP: (!) 152/48 (!) 140/41 (!) 167/55 (!) 170/53  Pulse: 79 67 78 90  Resp: 18 18 18 20   Temp: 100.1 F (37.8 C) 99.7 F (37.6 C) 99.4 F (37.4 C) 100 F (37.8 C)  TempSrc: Oral Oral Oral Oral  SpO2: 99% 96% 99% 98%  Weight:      Height:        Intake/Output Summary (Last 24  hours) at 09/06/2017 1218 Last data filed at 09/06/2017 0731 Gross per 24 hour  Intake 2095 ml  Output 2150 ml  Net -55 ml   Filed Weights   09/02/17 1635 09/03/17 0100  Weight: 79.4 kg (175 lb) 82.2 kg (181 lb 3.5 oz)    Examination: General exam: Appears comfortable  HEENT: PERRLA, oral mucosa moist, no sclera icterus or thrush Respiratory system: Clear to auscultation. Respiratory effort normal. Cardiovascular system: S1 & S2 heard, RRR.   Gastrointestinal system: Abdomen soft, no tenderness, nondistended. Normal bowel sound. No organomegaly Neuro: 4/5 strength in legs, 5/5 in arms- very alert Extremities: No cyanosis, clubbing or edema Skin: No  rashes or ulcers Psychiatry:  Normal mood and affect    Data Reviewed: I have personally reviewed following labs and imaging studies  CBC: Recent Labs  Lab 09/02/17 1644 09/03/17 0329 09/04/17 0722 09/05/17 0503 09/06/17 0455  WBC 19.7* 18.8* 12.8* 10.4 11.1*  NEUTROABS 16.3*  --   --   --   --   HGB 11.4* 11.2* 11.7* 9.7* 10.3*  HCT 35.6* 35.0* 36.9 30.4* 31.4*  MCV 92.7 91.9 92.0 91.6 89.5  PLT 248 266 253 232 253   Basic Metabolic Panel: Recent Labs  Lab 09/02/17 1644 09/03/17 0329 09/04/17 0338 09/04/17 0722 09/05/17 0503 09/06/17 0455  NA 142 145  --  140 140 141  K 4.2 3.6  --  3.8 3.1* 4.6  CL 105 111  --  108 106 108  CO2 26 19*  --  19* 23 23  GLUCOSE 161* 147*  --  136* 143* 141*  BUN 80* 63*  --  47* 39* 23*  CREATININE 3.22* 1.96* 1.42* 1.46* 1.23* 0.93  CALCIUM 8.6* 8.4*  --  8.9 8.2* 8.3*  MG  --  2.1  --   --   --   --   PHOS  --  3.7  --   --   --   --    GFR: Estimated Creatinine Clearance: 57.5 mL/min (by C-G formula based on SCr of 0.93 mg/dL). Liver Function Tests: Recent Labs  Lab 09/02/17 1644 09/03/17 0329  AST 57* 87*  ALT 37 62*  ALKPHOS 52 62  BILITOT 0.5 0.7  PROT 6.4* 6.7  ALBUMIN 2.8* 2.9*   No results for input(s): LIPASE, AMYLASE in the last 168 hours. Recent Labs  Lab  09/03/17 0037  AMMONIA 14   Coagulation Profile: No results for input(s): INR, PROTIME in the last 168 hours. Cardiac Enzymes: Recent Labs  Lab 09/03/17 0037  CKTOTAL 454*   BNP (last 3 results) No results for input(s): PROBNP in the last 8760 hours. HbA1C: No results for input(s): HGBA1C in the last 72 hours. CBG: No results for input(s): GLUCAP in the last 168 hours. Lipid Profile: No results for input(s): CHOL, HDL, LDLCALC, TRIG, CHOLHDL, LDLDIRECT in the last 72 hours. Thyroid Function Tests: No results for input(s): TSH, T4TOTAL, FREET4, T3FREE, THYROIDAB in the last 72 hours. Anemia Panel: No results for input(s): VITAMINB12, FOLATE, FERRITIN, TIBC, IRON, RETICCTPCT in the last 72 hours. Urine analysis:    Component Value Date/Time   COLORURINE YELLOW 09/02/2017 1650   APPEARANCEUR CLEAR 09/02/2017 1650   APPEARANCEUR Clear 08/08/2017 0820   LABSPEC 1.010 09/02/2017 1650   PHURINE 6.0 09/02/2017 1650   GLUCOSEU NEGATIVE 09/02/2017 1650   HGBUR NEGATIVE 09/02/2017 1650   BILIRUBINUR NEGATIVE 09/02/2017 1650   BILIRUBINUR Negative 08/08/2017 0820   KETONESUR NEGATIVE 09/02/2017 1650   PROTEINUR NEGATIVE 09/02/2017 1650   NITRITE NEGATIVE 09/02/2017 1650   LEUKOCYTESUR NEGATIVE 09/02/2017 1650   LEUKOCYTESUR Negative 08/08/2017 0820   Sepsis Labs: @LABRCNTIP (procalcitonin:4,lacticidven:4) ) Recent Results (from the past 240 hour(s))  Blood Culture (routine x 2)     Status: None (Preliminary result)   Collection Time: 09/02/17  4:44 PM  Result Value Ref Range Status   Specimen Description   Final    BLOOD LEFT FOREARM Performed at [facility], [address] [place], [place] [address]    Special Requests   Final    BOTTLES DRAWN AEROBIC AND ANAEROBIC Blood Culture results may not be optimal due to an excessive  volume of blood received in culture bottles Performed at [facility], [address], [place], [place]  [address]    Culture   Final    NO GROWTH 3 DAYS Performed at [facility], [address] [place], [place] [address]    Report Status PENDING  Incomplete  Blood Culture (routine x 2)     Status: None (Preliminary result)   Collection Time: 09/02/17  4:51 PM  Result Value Ref Range Status   Specimen Description   Final    BLOOD LEFT ANTECUBITAL Performed at [facility], [address] [place], [place] [address]    Special Requests   Final    BOTTLES DRAWN AEROBIC AND ANAEROBIC Blood Culture adequate volume Performed at [facility], [address] [place], [place] [address]    Culture   Final    NO GROWTH 3 DAYS Performed at [facility], [address] [place], [place] [address]    Report Status PENDING  Incomplete  Respiratory Panel by PCR     Status: None   Collection Time: 09/03/17 12:50 AM  Result Value Ref Range Status   Adenovirus NOT DETECTED NOT DETECTED Final   Coronavirus 229E NOT DETECTED NOT DETECTED Final   Coronavirus HKU1 NOT DETECTED NOT DETECTED Final   Coronavirus NL63 NOT DETECTED NOT DETECTED Final   Coronavirus OC43 NOT DETECTED NOT DETECTED Final   Metapneumovirus NOT DETECTED NOT DETECTED Final   Rhinovirus / Enterovirus NOT DETECTED NOT DETECTED Final   Influenza A NOT DETECTED NOT DETECTED Final   Influenza B NOT DETECTED NOT DETECTED Final   Parainfluenza Virus 1 NOT DETECTED NOT DETECTED Final   Parainfluenza Virus 2 NOT DETECTED NOT DETECTED Final   Parainfluenza Virus 3 NOT DETECTED NOT DETECTED Final   Parainfluenza Virus 4 NOT DETECTED NOT DETECTED Final   Respiratory Syncytial Virus NOT DETECTED NOT DETECTED Final   Bordetella pertussis NOT DETECTED NOT DETECTED Final   Chlamydophila pneumoniae NOT DETECTED NOT DETECTED Final   Mycoplasma pneumoniae NOT DETECTED NOT DETECTED Final    Comment: Performed at [facility], [address], [place], [place] [address]  MRSA PCR Screening      Status: None   Collection Time: 09/03/17  1:13 AM  Result Value Ref Range Status   MRSA by PCR NEGATIVE NEGATIVE Final    Comment:        The GeneXpert MRSA Assay (FDA approved for NASAL specimens only), is one component of a comprehensive MRSA colonization surveillance program. It is not intended to diagnose MRSA infection nor to guide or monitor treatment for MRSA infections. Performed at [facility], [address] [place], [place] [address]          Radiology Studies: No results found.    Scheduled Meds: . apixaban  5 mg Oral BID  . diclofenac sodium  2 g Topical QID  . diltiazem  240 mg Oral Daily  . feeding supplement (ENSURE ENLIVE)  237 mL Oral BID BM  . gabapentin  300 mg Oral QID  . hydrALAZINE  25 mg Oral Q8H  . levothyroxine  125 mcg Oral QAC breakfast  . metoprolol tartrate  25 mg Oral BID  . OXcarbazepine  150 mg Oral BID  . pantoprazole  40 mg Oral Daily  . rosuvastatin  10 mg Oral q1800   Continuous Infusions: .  sodium bicarbonate  infusion 1000 mL 75 mL/hr at 09/06/17 [phone]  LOS: 4 days    Time spent in minutes: 40     Calvert Cantor, MD [facility] Pager: [email or web address] Password [email or web address] 09/06/2017, 12:18 PM

## 2017-09-06 NOTE — Progress Notes (Signed)
PT Cancellation Note  Patient Details Name: Jillian Fisher MRN: [id number] DOB: [birth year]   Cancelled Treatment:     out of room MRI .  Pt has been evaluated with rec for SNF.     Felecia Shelling  PTA [facility]  Acute  Rehab Pager      [phone]

## 2017-09-07 LAB — CULTURE, BLOOD (ROUTINE X 2)
CULTURE: NO GROWTH
Culture: NO GROWTH
SPECIAL REQUESTS: ADEQUATE

## 2017-09-07 LAB — CBC
HCT: 32.5 % — ABNORMAL LOW (ref 36.0–46.0)
Hemoglobin: 10.4 g/dL — ABNORMAL LOW (ref 12.0–15.0)
MCH: 29.5 pg (ref 26.0–34.0)
MCHC: 32 g/dL (ref 30.0–36.0)
MCV: 92.1 fL (ref 78.0–100.0)
PLATELETS: 259 10*3/uL (ref 150–400)
RBC: 3.53 MIL/uL — AB (ref 3.87–5.11)
RDW: 14.1 % (ref 11.5–15.5)
WBC: 8.4 10*3/uL (ref 4.0–10.5)

## 2017-09-07 LAB — BASIC METABOLIC PANEL
ANION GAP: 8 (ref 5–15)
BUN: 12 mg/dL (ref 6–20)
CALCIUM: 8.2 mg/dL — AB (ref 8.9–10.3)
CO2: 27 mmol/L (ref 22–32)
CREATININE: 0.85 mg/dL (ref 0.44–1.00)
Chloride: 104 mmol/L (ref 101–111)
GFR calc non Af Amer: >60 mL/min (ref >60)
Glucose, Bld: 110 mg/dL — ABNORMAL HIGH (ref 65–99)
Potassium: 3.7 mmol/L (ref 3.5–5.1)
SODIUM: 139 mmol/L (ref 135–145)

## 2017-09-07 LAB — MAGNESIUM: Magnesium: 1.3 mg/dL — ABNORMAL LOW (ref 1.7–2.4)

## 2017-09-07 LAB — PHOSPHORUS: PHOSPHORUS: 3.1 mg/dL (ref 2.5–4.6)

## 2017-09-07 MED ORDER — ALPRAZOLAM 0.25 MG PO TABS: 0.2500 mg | ORAL_TABLET | Freq: Every day | ORAL | 0 refills | Status: DC | PRN

## 2017-09-07 MED ORDER — ENSURE ENLIVE PO LIQD: 237.0000 mL | Freq: Two times a day (BID) | ORAL | 12 refills | Status: DC

## 2017-09-07 MED ORDER — DICLOFENAC SODIUM 1 % TD GEL: 2.0000 g | Freq: Four times a day (QID) | TRANSDERMAL | Status: DC

## 2017-09-07 MED ORDER — MAGNESIUM SULFATE 50 % IJ SOLN
3.0000 g | Freq: Once | INTRAVENOUS | Status: AC
  Administered 2017-09-07: 3 g via INTRAVENOUS
  Filled 2017-09-07: qty 6

## 2017-09-07 MED ORDER — METOPROLOL TARTRATE 50 MG PO TABS: 50.0000 mg | ORAL_TABLET | Freq: Two times a day (BID) | ORAL | Status: DC

## 2017-09-07 MED ORDER — HYDRALAZINE HCL 25 MG PO TABS: 25.0000 mg | ORAL_TABLET | Freq: Three times a day (TID) | ORAL | Status: DC

## 2017-09-07 NOTE — Discharge Summary (Addendum)
Physician Discharge Summary  Jillian Fisher ZOX:[id number] DOB: April 14, 1945 DOA: 09/02/2017  PCP: Jillian Fisher, Jillian Saucer, MD  Admit date: 09/02/2017 Discharge date: 09/07/2017  Admitted From: Home Disposition: SNF  Recommendations for Outpatient Follow-up:  1. Follow up with PCP in 1-2 weeks 2. Follow up with Neurology Jillian Fisher in the outpatient setting in 1-2 weeks 3. If patient continues to have persistent fevers will need outpatient Infectious Diseases Evaluation and Follow up.  4. Please obtain CMP/CBC, Mag, Phos in one week 5. Please follow up on the following pending results:  Home Health: No Equipment/Devices: None recommended by PT   ADDENDUM: Patient was deemed medically stable to be discharged yesterday however did not go because of insurance authorization.  Overnight she spiked another fever and case was discussed with Infectious Diseases Jillian Fisher, and with no active infection found and no leukocytosis, he recommended continuing to monitor and have the patient follow-up as an outpatient if she persistently spikes of fever.  Patient electrolytes were off this AM but they were replete her to discharge.  Patient has a bed available at skilled nursing facility today and received insurance authorization so will be discharged today to continue strength training.  Continue follow-up with PCP as well as neurology and if patient continues to spike fevers will need to follow-up with infectious disease as an outpatient.  Discharge Condition: Stable CODE STATUS: FULL CODE Diet recommendation: Dysphagia 3 Diet Thin Liquids   Brief/Interim Summary: Jillian Fisher is a 72 y.o.female with advanced MS, neurogenic bladder, recurrent UTIs, atrial fibrillation on anticoagulation,hypertension, hyperlipidemia and hypothyroidism, presented to [facility] emergency room for evaluation of lethargy that had been ongoing for past for 5 days along with low-grade fevers. History was obtained from husband  Jillian Fisher. She was not eating or drinking well for 2-3 days, has been very sleepy & has not been walking. She did have diarrhea Wed morning and non since. Her urine ouput has decreased and her depends have not been wet as before per family.   She was scheduled to get 5 days of IV Solu-Medrol (Jillian Fisher) for presumed MS exacerbation, when the home health nurse found her to be altered and with a fever of 100.7. EMS gave her 500 cc of IVF.  On-call neurologist was called by [facility] & recommended evaluation in the emergency room. She was given Vanc and Zosyn in ED. UA neg for infection and found to have an AKI and dehydration. She was given IV fluid resuscitation and improved.  AKI improved and there is no source of infection found.  She is deemed medically stable to be discharged to skilled nursing facility.  Neurology evaluated and recommended no IV steroids given her MRI findings.  She will need to follow-up with PCP as well as Neurology in outpatient setting.  Discharge Diagnoses:  Principal Problem:   AKI (acute kidney injury) (HCC) Active Problems:   Multiple sclerosis (HCC)   Neurogenic bladder   PAF (paroxysmal atrial fibrillation) (HCC)   Elevated lactic acid level   Dehydration   SIRS (systemic inflammatory response syndrome) (HCC)   Acute encephalopathy  AKI (acute kidney injury) Elevated lactic acid level Dehydration Metabolic acidosis Neurogenic bladder -Appeared prerenal-  Cr has improved considerably with IVF  -Cr 3.22 -> 0.85 -Changed to Bicarb infusion due to acidosis - Continued @ 75 cc /hr and will stop as CO2 is now 29 -Good urine output but scan showing enlarged bladder and chronic hydronephrosis- 300 cc urine out after foley placed on 6/2- cont  foley- d/c Ditropan as bladder is distended and she has hydronephrosis -Follow up as an outpatient   Fever / Leukocytosis/  SIRS (systemic inflammatory response syndrome)  -No source found but remains intermittently febrile. TMax  today was 101.3 -UA neg, no resp symptoms- CXR neg- no GI symptoms- no rash -She did have mild diffuse abdominal tenderness - CT of the abdomen reviewed- nothing significant other than enlarged bladder- report below- placed foley -Stopped antibiotics 6/1- WBC continues to improve- ? Viral infection- on NOAC so did not check for thrombosis -6/3 - fever of 102 in the evening- has right knee pain but no swelling - blood cultures redrawn and are Negative -6/4- has some tenderness in LLQ and suprapubic area today- right knee is mildly tender with deep pressure but again, no swollen-  CT with contrast ordered of abd/pelvis r/o diverticulitis -CT Abd/Pelvis showed Thick-walled urinary bladder, which is not well evaluated, as it is decompressed around urinary Foley, and contains foci of gas, presumably postprocedural. Otherwise no acute abnormalities within the abdomen or pelvis. Mild prominence of mesenteric and retroperitoneal lymph nodes, sub pathologic by CT criteria, nonspecific. Small left pleural effusion and bilateral lower lobe subsegmental atelectasis. -Night of 6/5 had a TMax of 101.3 -WBC count continues to improve and is 6.2 today -Follow up with PCP in the outpatient setting. -Continue to Monitor in the outpatient setting for S/Sx of infection and repeat CBC as an outpatient  -Discussed Case with Infectious Disease physician Jillian. Cliffton Asters who recommended to continue to monitor and feels like she has no active infection.  If she persistently has fever here recommend outpatient follow-up for fever of unknown origin.  Multiple sclerosis / Acute Encephalopathy -More alert than what was described in ED (was quite lethargic) -At baseline she is able to perform ADLs per her daughter [name] -MRI per Neurology see report below  -6/2 mental status significantly improved- Jillian. Butler Denmark had spoken with Jillian Amada Jupiter 6/2 who does not feel she needs any IV steroids and suspects recrudence of symptoms  due to possibly infection- I feel that it had to do with dehydrating and AKI -Resumed Neurontin -Neuro would like imaging of the spine as she has not had it in many years -On 6/4 - still able to sit up and feed herself today despite fevers and MRIs reviewed- no acute findings -Neurology Signed off and recommended no IV Steroids -Follow up with [facility] Neurology as Mental Status improved   PAF (paroxysmal atrial fibrillation)  Hypertensive urgency- SBP 190-200s now -C/w Metoprolol 50 mg po BID and Diltiazem 240 mg po Daily for Rate Control and BP -Needed IV Hydralazine and Labetalol PRN -Added 25 mg oral Hydralazine TID and will continue at D/C -If BP still high- increase  Hydrazine to 50 TID -Continue Apixaban 5 mg po BID  Hypokalemia -Improved. Was 3.2 this AM -Replete prior to D/C -Continue to Monitor and replete as Necessary -Repeat CMP in the outpatient setting   Trigeminal Neuralgia -Continue with Oxcarbazepine 150 minutes p.o. twice daily  Hypomagnesemia -Patient magnesium level was 1.6 this a.m. -Replete with IV mag sulfate 2 g prior to D/C -Continue to monitor and replete as necessary -Repeat magnesium level at skilled nursing facility  Discharge Instructions Discharge Instructions    Call MD for:  difficulty breathing, headache or visual disturbances   Complete by:  As directed    Call MD for:  extreme fatigue   Complete by:  As directed    Call MD for:  hives  Complete by:  As directed    Call MD for:  persistant dizziness or light-headedness   Complete by:  As directed    Call MD for:  persistant nausea and vomiting   Complete by:  As directed    Call MD for:  redness, tenderness, or signs of infection (pain, swelling, redness, odor or green/yellow discharge around incision site)   Complete by:  As directed    Call MD for:  severe uncontrolled pain   Complete by:  As directed    Call MD for:  temperature >100.4   Complete by:  As directed    Diet -  low sodium heart healthy   Complete by:  As directed    DYSPHAGIA 3   Discharge instructions   Complete by:  As directed    Follow-up with PCP as needed as well as Neurology in outpatient setting.  Take all medications as prescribed.  If symptoms change or worsen please return to the emergency room for evaluation   Increase activity slowly   Complete by:  As directed      Allergies as of 09/07/2017      Reactions   Sulfa Antibiotics Rash      Medication List    STOP taking these medications   oxybutynin 5 MG tablet Commonly known as:  DITROPAN   predniSONE 10 MG tablet Commonly known as:  DELTASONE     TAKE these medications   ALPRAZolam 0.25 MG tablet Commonly known as:  XANAX Take 1 tablet (0.25 mg total) by mouth daily as needed for anxiety.   apixaban 5 MG Tabs tablet Commonly known as:  ELIQUIS Take 1 tablet (5 mg total) by mouth 2 (two) times daily.   diclofenac sodium 1 % Gel Commonly known as:  VOLTAREN Apply 2 g topically 4 (four) times daily.   diltiazem 240 MG 24 hr capsule Commonly known as:  CARDIZEM CD Take 1 capsule (240 mg total) by mouth daily.   feeding supplement (ENSURE ENLIVE) Liqd Take 237 mLs by mouth 2 (two) times daily between meals.   gabapentin 300 MG capsule Commonly known as:  NEURONTIN Take 1 capsule (300 mg total) by mouth 4 (four) times daily.   hydrALAZINE 25 MG tablet Commonly known as:  APRESOLINE Take 1 tablet (25 mg total) by mouth every 8 (eight) hours.   levothyroxine 125 MCG tablet Commonly known as:  SYNTHROID, LEVOTHROID Take 125 mcg by mouth daily before breakfast.   metoprolol tartrate 50 MG tablet Commonly known as:  LOPRESSOR Take 1 tablet (50 mg total) by mouth 2 (two) times daily. What changed:    medication strength  how much to take   multivitamin with minerals Tabs tablet Take 1 tablet by mouth daily.   omeprazole 20 MG capsule Commonly known as:  PRILOSEC Take 20 mg by mouth daily as needed  (reflux).   OXcarbazepine 150 MG tablet Commonly known as:  TRILEPTAL One tablet twice a day for 1 week, then take 2 tablets twice a day What changed:    how much to take  how to take this  when to take this  additional instructions   rosuvastatin 20 MG tablet Commonly known as:  CRESTOR Take 10 mg by mouth daily.   TECFIDERA 120 & 240 MG Misc Generic drug:  Dimethyl Fumarate Take 1 Dose by mouth 2 (two) times daily.      Contact information for after-discharge care    Destination    [facility] .  Service:  Skilled Nursing Contact information: [address] [place] [address] [phone]             Allergies  Allergen Reactions  . Sulfa Antibiotics Rash   Consultations:  Neurology  Discussed Case with ID Physician Jillian. Cliffton Asters  Procedures/Studies: Ct Abdomen Pelvis Wo Contrast  Result Date: 09/03/2017 CLINICAL DATA:  "72 y.o. female with advanced MS, neurogenic bladder, recurrent UTIs, atrial fibrillation on anticoagulation,hypertension, hyperlipidemia and hypothyroidism, presented to [facility] emergency room for evaluation of lethargy that had been ongoing for past for 5 days along with low-grade fevers. " EXAM: CT ABDOMEN AND PELVIS WITHOUT CONTRAST TECHNIQUE: Multidetector CT imaging of the abdomen and pelvis was performed following the standard protocol without IV contrast. COMPARISON:  CT, 08/19/2008. FINDINGS: Lower chest: Minor dependent lung base subsegmental atelectasis. Lung bases otherwise clear. Heart normal size. Small sliding hiatal hernia. Hepatobiliary: No focal liver abnormality is seen. No gallstones, gallbladder wall thickening, or biliary dilatation. Pancreas: Unremarkable. No pancreatic ductal dilatation or surrounding inflammatory changes. Spleen: Normal in size without focal abnormality. Adrenals/Urinary Tract: No adrenal masses. Moderate right and mild left hydronephrosis. No renal masses or  stones. Moderate dilation of the right ureter with mild dilation of the left ureter. No ureteral stones. Bladder is distended. No bladder wall thickening or masses. No bladder stones. Stomach/Bowel: Small sliding hiatal hernia. Stomach otherwise unremarkable. Bowel is normal in caliber. No bowel wall thickening or evidence of inflammation. Normal appendix visualized. Vascular/Lymphatic: Mild aortic atherosclerotic calcifications. No aneurysm. No pathologically enlarged lymph nodes. Reproductive: Status post hysterectomy. No adnexal masses. Other: Trace amount fluid attenuation noted in the left lower quadrant and left anterior pelvis anterior to the psoas muscle. No abdominal wall hernia. Inflammatory haziness noted in the subcutaneous fat the left inguinal region. Etiology is unclear. Musculoskeletal: No acute or significant osseous findings. IMPRESSION: 1. Moderate right and mild left hydroureteronephrosis, with no renal or ureteral stones. Bladder is distended. The hydronephrosis/hydroureter is likely due to chronic bladder distention given the patient's history of neurogenic bladder. 2. Nonspecific infiltration of the fat in the left inguinal region, which appears inflammatory. 3. Small sliding hiatal hernia.  Mild aortic atherosclerosis. 4. No convincing acute abnormality within the abdomen or pelvis. Electronically Signed   By: Amie Portland M.D.   On: 09/03/2017 10:39   Dg Chest 2 View  Result Date: 09/02/2017 CLINICAL DATA:  Shortness of breath EXAM: CHEST - 2 VIEW COMPARISON:  02/02/2016 FINDINGS: Cardiac shadow is mildly enlarged. The lungs are well aerated bilaterally. No focal infiltrate or sizable effusion is seen. IMPRESSION: No acute abnormality noted. Electronically Signed   By: Alcide Clever M.D.   On: 09/02/2017 17:34   Ct Head Wo Contrast  Result Date: 09/02/2017 CLINICAL DATA:  Altered level of consciousness. Fever. Lethargy. History of multiple sclerosis. EXAM: CT HEAD WITHOUT CONTRAST  TECHNIQUE: Contiguous axial images were obtained from the base of the skull through the vertex without intravenous contrast. COMPARISON:  Brain MRI 08/13/2017 FINDINGS: Brain: There is no evidence of acute infarct, intracranial hemorrhage, mass, midline shift, or extra-axial fluid collection. The ventricles and sulci are within normal limits for age. Extensive patchy to confluent hypodensity is present throughout the cerebral white matter. Vascular: No hyperdense vessel. Skull: No fracture or focal osseous lesion. Sinuses/Orbits: Paranasal sinuses and mastoid air cells are clear. Unremarkable orbits. Other: None. IMPRESSION: 1. No evidence of acute intracranial abnormality. 2. Extensive white matter disease consistent with known multiple sclerosis. Electronically Signed   By:  Sebastian Ache M.D.   On: 09/02/2017 17:37   Mr Brain Wo Contrast  Result Date: 09/03/2017 CLINICAL DATA:  Worsening of confusion.  Multiple sclerosis. EXAM: MRI HEAD WITHOUT CONTRAST TECHNIQUE: Multiplanar, multiecho pulse sequences of the brain and surrounding structures were obtained without intravenous contrast. COMPARISON:  CT 09/02/2017.  MRI 08/13/2017 and 05/21/2017. FINDINGS: Brain: Diffusion imaging does not show any acute or subacute infarction. No abnormality is seen affecting the brainstem or cerebellum. Widespread changes of demyelinating disease affecting the periventricular and subcortical white matter are unchanged. Some scattered areas of cortical involvement in the frontal and parietal regions. No evidence of mass lesion, hemorrhage, hydrocephalus or extra-axial collection. Vascular: Major vessels at the base of the brain show flow. Skull and upper cervical spine: Negative Sinuses/Orbits: Clear/normal Other: None IMPRESSION: Advanced chronic changes of demyelinating disease affecting the cerebral hemispheric white matter as outlined above. No appreciable change since the previous studies of this year. No acute or reversible  process identified. Electronically Signed   By: Paulina Fusi M.D.   On: 09/03/2017 10:41   Mr Laqueta Jean ZO Contrast  Result Date: 08/15/2017  [facility] [address] [place], [place] [address] [phone] NEUROIMAGING REPORT STUDY DATE: 08/13/2017 PATIENT NAME: PEGEEN STIGER DOB: Jul 18, 1945 MRN: [id number] EXAM: MRI Brain with and without contrast ORDERING CLINICIAN: York Spaniel, MD CLINICAL HISTORY: 72 year old woman with multiple sclerosis and gait disturbance COMPARISON FILMS: MRI 05/21/2017 TECHNIQUE:MRI of the brain with and without contrast was obtained utilizing 5 mm axial slices with T1, T2, T2 flair, SWI and diffusion weighted views.  T1 sagittal, T2 coronal and postcontrast views in the axial and coronal plane were obtained. CONTRAST: 15 ml Multihance IMAGING SITE: [employer] [facility], [address] [place]. FINDINGS: On sagittal images, the spinal cord is imaged caudally to C5 and is normal in caliber.   On the sagittal FLAIR images, there appears to be increased signal within the spinal cord consistent with MS plaque adjacent to C2-C3 and C4.  The contents of the posterior fossa are of normal size and position.   The pituitary gland and optic chiasm appear normal.    There is mild generalized cortical atrophy and mild corpus callosal atrophy.  There are no abnormal extra-axial collections of fluid.  There is a T2/FLAIR hyperintense focus within the right middle cerebellar peduncle and some smaller foci closer to the fourth ventricle in the right middle cerebellar peduncle and in the pons.  One small focus is noted in the right globus pallidus.  In the hemispheres there are many T2/FLAIR hyperintense foci in the periventricular, juxtacortical and deep white matter.  None of the foci appears to be acute and compared to the MRI dated 05/21/2017, there is no interval change..  2 small microhemorrhages are noted within the left basal ganglia in the right thalamus.  These  were also seen on the previous MRI.Marland Kitchen   Diffusion weighted images are normal.  Susceptibility weighted images are normal.   The orbits appear normal.   The VIIth/VIIIth nerve complex appears normal.  The mastoid air cells appear normal.  The paranasal sinuses appear normal.  Flow voids are identified within the major intracerebral arteries.  After the infusion of contrast material, a normal enhancement pattern is noted.  The pachymeningeal enhancement that was noted on the 05/21/2017 MRI has resolved.   This MRI of the brain with and without contrast shows the following: 1.   Multiple T2/FLAIR hyperintense foci in the hemispheres, middle cerebellar  peduncle and spinal cord in a pattern and configuration consistent with chronic demyelinating plaque associated with multiple sclerosis.  Compared to the MRI dated 05/21/2017, there has been no new lesions. 2.   The pachymeningeal enhancement noted on the 05/21/2017 MRI has resolved. 3.   There are 2 small microhemorrhages in the left basal ganglia and right thalamus that are likely due to hypertension and are unchanged when compared to the previous MRI. INTERPRETING PHYSICIAN: Richard A. Epimenio Foot, MD, PhD, FAAN Certified in  Neuroimaging by [employer]   Mr Cervical Spine W Wo Contrast  Result Date: 09/06/2017 CLINICAL DATA:  Initial evaluation for ongoing lethargy with low-grade fevers for several days. History of chronic multiple sclerosis, inability to ambulate. EXAM: MRI CERVICAL AND THORACIC SPINE WITHOUT AND WITH CONTRAST TECHNIQUE: Multiplanar and multiecho pulse sequences of the cervical spine, to include the craniocervical junction and cervicothoracic junction, and thoracic spine, were obtained without and with intravenous contrast. CONTRAST:  17mL MULTIHANCE GADOBENATE DIMEGLUMINE 529 MG/ML IV SOLN COMPARISON:  Comparison made with prior cervical spine MRI from 05/28/2014. FINDINGS: MRI CERVICAL SPINE FINDINGS Alignment: Vertebral bodies  normally aligned with preservation of the normal cervical lordosis. No listhesis. Vertebrae: Vertebral body heights well maintained without evidence for acute or chronic fracture. Bone marrow signal intensity within normal limits. No discrete or worrisome osseous lesions. No abnormal marrow edema or enhancement. Cord: Patchy cord signal abnormality again seen at the level of C2, C3-4, C5-6, and T1. Associated spinal cord atrophy noted at T1. Overall, appearance is similar to previous. No cord edema or enhancement to suggest active demyelination. Overall, appearance is relatively similar to previous MRI. Posterior Fossa, vertebral arteries, paraspinal tissues: Advanced atrophy with sequelae of chronic demyelination noted within the visualized brain. Craniocervical junction normal. Paraspinous and prevertebral soft tissues demonstrate no acute abnormality. Diffusely enlarged multinodular goiter noted. Normal intravascular flow voids present within the vertebral arteries bilaterally. Disc levels: C2-C3: Unremarkable. C3-C4: Mild diffuse disc bulge with bilateral uncovertebral hypertrophy. Facet and ligamentum flavum hypertrophy, greater on the left. Mild spinal stenosis. Severe left with mild right C4 foraminal narrowing. C4-C5: Mild diffuse disc bulge with uncovertebral hypertrophy. No significant spinal stenosis. Moderate right with mild left C5 foraminal stenosis. C5-C6: Diffuse disc bulge with bilateral uncovertebral and facet hypertrophy. Ligamentum flavum thickening. Mild spinal stenosis with moderate bilateral C6 foraminal narrowing. C6-C7:  Unremarkable. C7-T1:  Mild facet hypertrophy.  No significant stenosis. MRI THORACIC SPINE FINDINGS Alignment:  Examination degraded by motion artifact. Vertebral bodies normally aligned with preservation of the normal thoracic kyphosis. No listhesis. Vertebrae: Vertebral body heights maintained without evidence for acute or chronic fracture. Bone marrow signal intensity  within normal limits. No discrete or worrisome osseous lesions. No abnormal marrow edema or enhancement. No evidence for osteomyelitis discitis or intraspinal infection. Cord: Abnormal T2 hyperintense lesion seen at the level of T1 (series 14, image 3), possibly at T6-7 (series 14, image 20), and at T12 (series 14, image 38), consistent with chronic demyelination. Associated spinal cord atrophy at T1. No abnormal cord edema or enhancement to suggest active demyelination. Paraspinal and other soft tissues: Small layering bilateral pleural effusions with associated atelectatic changes, slightly greater on the left. No other paraspinous soft tissue abnormality. Disc levels: T9-10: Diffuse disc bulge. Bilateral facet and ligament flavum hypertrophy. No significant canal stenosis or cord deformity. Mild bilateral foraminal narrowing, slightly worse on the right. T10-11: Bilateral facet hypertrophy, right greater than left. Resultant mild bilateral foraminal stenosis. No significant canal narrowing. T11-12: Mild  facet hypertrophy.  No stenosis. No other significant degenerative changes within the thoracic spine. No other stenosis or impingement. IMPRESSION: MRI CERVICAL SPINE IMPRESSION 1. Patchy multifocal cord signal abnormality at C2, C3-4, C5-6, and T1, with associated spinal cord atrophy at T1. Findings consistent with chronic demyelinating disease. No cord edema or enhancement to suggest acute demyelination. Overall, appearance is relatively similar as compared to previous MRI from 2016. 2. Mild-to-moderate multilevel cervical spondylolysis with resultant mild spinal stenosis at C3-4 and C5-6. 3. Multifactorial degenerative changes with resultant multilevel foraminal narrowing as above. Notable findings include severe left C4 foraminal stenosis, moderate right C5 foraminal narrowing, with moderate bilateral C6 foraminal stenosis. MRI THORACIC SPINE IMPRESSION 1. Patchy cord signal abnormality at the level of T1,  T6-7, and T12, consistent with chronic demyelinating disease. No evidence for active demyelination. 2. Mild disc bulging with facet hypertrophy at T9-10 and T10-11 with resultant mild bilateral foraminal stenosis. 3. Small layering bilateral pleural effusions with associated atelectasis. Electronically Signed   By: Rise Mu M.D.   On: 09/06/2017 14:09   Mr Thoracic Spine W Wo Contrast  Result Date: 09/06/2017 CLINICAL DATA:  Initial evaluation for ongoing lethargy with low-grade fevers for several days. History of chronic multiple sclerosis, inability to ambulate. EXAM: MRI CERVICAL AND THORACIC SPINE WITHOUT AND WITH CONTRAST TECHNIQUE: Multiplanar and multiecho pulse sequences of the cervical spine, to include the craniocervical junction and cervicothoracic junction, and thoracic spine, were obtained without and with intravenous contrast. CONTRAST:  17mL MULTIHANCE GADOBENATE DIMEGLUMINE 529 MG/ML IV SOLN COMPARISON:  Comparison made with prior cervical spine MRI from 05/28/2014. FINDINGS: MRI CERVICAL SPINE FINDINGS Alignment: Vertebral bodies normally aligned with preservation of the normal cervical lordosis. No listhesis. Vertebrae: Vertebral body heights well maintained without evidence for acute or chronic fracture. Bone marrow signal intensity within normal limits. No discrete or worrisome osseous lesions. No abnormal marrow edema or enhancement. Cord: Patchy cord signal abnormality again seen at the level of C2, C3-4, C5-6, and T1. Associated spinal cord atrophy noted at T1. Overall, appearance is similar to previous. No cord edema or enhancement to suggest active demyelination. Overall, appearance is relatively similar to previous MRI. Posterior Fossa, vertebral arteries, paraspinal tissues: Advanced atrophy with sequelae of chronic demyelination noted within the visualized brain. Craniocervical junction normal. Paraspinous and prevertebral soft tissues demonstrate no acute abnormality.  Diffusely enlarged multinodular goiter noted. Normal intravascular flow voids present within the vertebral arteries bilaterally. Disc levels: C2-C3: Unremarkable. C3-C4: Mild diffuse disc bulge with bilateral uncovertebral hypertrophy. Facet and ligamentum flavum hypertrophy, greater on the left. Mild spinal stenosis. Severe left with mild right C4 foraminal narrowing. C4-C5: Mild diffuse disc bulge with uncovertebral hypertrophy. No significant spinal stenosis. Moderate right with mild left C5 foraminal stenosis. C5-C6: Diffuse disc bulge with bilateral uncovertebral and facet hypertrophy. Ligamentum flavum thickening. Mild spinal stenosis with moderate bilateral C6 foraminal narrowing. C6-C7:  Unremarkable. C7-T1:  Mild facet hypertrophy.  No significant stenosis. MRI THORACIC SPINE FINDINGS Alignment:  Examination degraded by motion artifact. Vertebral bodies normally aligned with preservation of the normal thoracic kyphosis. No listhesis. Vertebrae: Vertebral body heights maintained without evidence for acute or chronic fracture. Bone marrow signal intensity within normal limits. No discrete or worrisome osseous lesions. No abnormal marrow edema or enhancement. No evidence for osteomyelitis discitis or intraspinal infection. Cord: Abnormal T2 hyperintense lesion seen at the level of T1 (series 14, image 3), possibly at T6-7 (series 14, image 20), and at T12 (series 14, image 38), consistent with chronic  demyelination. Associated spinal cord atrophy at T1. No abnormal cord edema or enhancement to suggest active demyelination. Paraspinal and other soft tissues: Small layering bilateral pleural effusions with associated atelectatic changes, slightly greater on the left. No other paraspinous soft tissue abnormality. Disc levels: T9-10: Diffuse disc bulge. Bilateral facet and ligament flavum hypertrophy. No significant canal stenosis or cord deformity. Mild bilateral foraminal narrowing, slightly worse on the right.  T10-11: Bilateral facet hypertrophy, right greater than left. Resultant mild bilateral foraminal stenosis. No significant canal narrowing. T11-12: Mild facet hypertrophy.  No stenosis. No other significant degenerative changes within the thoracic spine. No other stenosis or impingement. IMPRESSION: MRI CERVICAL SPINE IMPRESSION 1. Patchy multifocal cord signal abnormality at C2, C3-4, C5-6, and T1, with associated spinal cord atrophy at T1. Findings consistent with chronic demyelinating disease. No cord edema or enhancement to suggest acute demyelination. Overall, appearance is relatively similar as compared to previous MRI from 2016. 2. Mild-to-moderate multilevel cervical spondylolysis with resultant mild spinal stenosis at C3-4 and C5-6. 3. Multifactorial degenerative changes with resultant multilevel foraminal narrowing as above. Notable findings include severe left C4 foraminal stenosis, moderate right C5 foraminal narrowing, with moderate bilateral C6 foraminal stenosis. MRI THORACIC SPINE IMPRESSION 1. Patchy cord signal abnormality at the level of T1, T6-7, and T12, consistent with chronic demyelinating disease. No evidence for active demyelination. 2. Mild disc bulging with facet hypertrophy at T9-10 and T10-11 with resultant mild bilateral foraminal stenosis. 3. Small layering bilateral pleural effusions with associated atelectasis. Electronically Signed   By: Rise Mu M.D.   On: 09/06/2017 14:09   Ct Abdomen Pelvis W Contrast  Result Date: 09/06/2017 CLINICAL DATA:  Lethargy and low grade fever. History of advanced MS and neurogenic bladder. EXAM: CT ABDOMEN AND PELVIS WITH CONTRAST TECHNIQUE: Multidetector CT imaging of the abdomen and pelvis was performed using the standard protocol following bolus administration of intravenous contrast. CONTRAST:  ISOVUE-300 IOPAMIDOL (ISOVUE-300) INJECTION 61%, 30mL ISOVUE-300 IOPAMIDOL (ISOVUE-300) INJECTION 61% COMPARISON:  Body CT 09/03/2017  FINDINGS: Lower chest: Small left pleural effusion. Mild bibasilar atelectasis. Hepatobiliary: No focal liver abnormality is seen. No gallstones, gallbladder wall thickening, or biliary dilatation. Pancreas: Unremarkable. No pancreatic ductal dilatation or surrounding inflammatory changes. Spleen: Normal in size without focal abnormality. Adrenals/Urinary Tract: Normal appearance of the adrenal glands, kidneys and ureters. The urinary bladder is decompressed around urinary Foley, thick-walled and contains foci of gas. Stomach/Bowel: Stomach is within normal limits. No evidence of appendicitis. No evidence of bowel wall thickening, distention, or inflammatory changes. Probable duodenal diverticulum. Vascular/Lymphatic: Mild aortic atherosclerosis. Numerous mildly prominent but sub pathologic by CT criteria mesenteric and retroperitoneal lymph nodes. Reproductive: Status post hysterectomy. No adnexal masses. Other: Subcutaneous fat stranding in the lateral abdominal walls, left greater than right. Musculoskeletal: No acute or significant osseous findings. IMPRESSION: Thick-walled urinary bladder, which is not well evaluated, as it is decompressed around urinary Foley, and contains foci of gas, presumably postprocedural. Otherwise no acute abnormalities within the abdomen or pelvis. Mild prominence of mesenteric and retroperitoneal lymph nodes, sub pathologic by CT criteria, nonspecific. Small left pleural effusion and bilateral lower lobe subsegmental atelectasis. Electronically Signed   By: Ted Mcalpine M.D.   On: 09/06/2017 20:07    Subjective: Seen and examined at bedside and had improved.  Denies any complaints at this time.  No nausea, vomiting lightheadedness or dizziness. No other complaints or concerns and agreeable to going to skilled nursing facility.  Discharge Exam: Vitals:   09/06/17 2157 09/07/17 4034  BP: (!) 154/87 (!) 153/57  Pulse: 76 79  Resp: 18 16  Temp: 100 F (37.8 C) 99.8 F  (37.7 C)  SpO2: 99% 98%   Vitals:   09/06/17 1604 09/06/17 2157 09/07/17 0443 09/07/17 0543  BP: (!) 154/59 (!) 154/87 (!) 153/57   Pulse: 82 76 79   Resp: 20 18 16    Temp: 99.6 F (37.6 C) 100 F (37.8 C) 99.8 F (37.7 C)   TempSrc: Oral Oral Oral   SpO2: 96% 99% 98%   Weight:    71.6 kg (157 lb 13.6 oz)  Height:       General: Pt is alert, awake, not in acute distress Cardiovascular: RRR, S1/S2 +, no rubs, no gallops Respiratory: Diminished bilaterally, no wheezing, no rhonchi Abdominal: Soft, NT, Distended due to body habitus, bowel sounds + Extremities: Trace edema, no cyanosis  The results of significant diagnostics from this hospitalization (including imaging, microbiology, ancillary and laboratory) are listed below for reference.    Microbiology: Recent Results (from the past 240 hour(s))  Blood Culture (routine x 2)     Status: None (Preliminary result)   Collection Time: 09/02/17  4:44 PM  Result Value Ref Range Status   Specimen Description   Final    BLOOD LEFT FOREARM Performed at [facility], [address], [place], [place] [address]    Special Requests   Final    BOTTLES DRAWN AEROBIC AND ANAEROBIC Blood Culture results may not be optimal due to an excessive volume of blood received in culture bottles Performed at [facility], [address] [place], [place] [address]    Culture   Final    NO GROWTH 4 DAYS Performed at [facility], [address], [place], [place] [address]    Report Status PENDING  Incomplete  Blood Culture (routine x 2)     Status: None (Preliminary result)   Collection Time: 09/02/17  4:51 PM  Result Value Ref Range Status   Specimen Description   Final    BLOOD LEFT ANTECUBITAL Performed at [facility], [address] [place], [place] [address]    Special Requests   Final    BOTTLES DRAWN AEROBIC AND ANAEROBIC Blood Culture adequate volume Performed at [facility], [address], [place], [place] [address]    Culture   Final    NO GROWTH 4 DAYS Performed at [facility], [address] [place], [place] [address]    Report Status PENDING  Incomplete  Respiratory Panel by PCR     Status: None   Collection Time: 09/03/17 12:50 AM  Result Value Ref Range Status   Adenovirus NOT DETECTED NOT DETECTED Final   Coronavirus 229E NOT DETECTED NOT DETECTED Final   Coronavirus HKU1 NOT DETECTED NOT DETECTED Final   Coronavirus NL63 NOT DETECTED NOT DETECTED Final   Coronavirus OC43 NOT DETECTED NOT DETECTED Final   Metapneumovirus NOT DETECTED NOT DETECTED Final   Rhinovirus / Enterovirus NOT DETECTED NOT DETECTED Final   Influenza A NOT DETECTED NOT DETECTED Final   Influenza B NOT DETECTED NOT DETECTED Final   Parainfluenza Virus 1 NOT DETECTED NOT DETECTED Final   Parainfluenza Virus 2 NOT DETECTED NOT DETECTED Final   Parainfluenza Virus 3 NOT DETECTED NOT DETECTED Final   Parainfluenza Virus 4 NOT DETECTED NOT DETECTED Final   Respiratory Syncytial Virus NOT DETECTED NOT DETECTED Final   Bordetella pertussis NOT DETECTED NOT DETECTED  Final   Chlamydophila pneumoniae NOT DETECTED NOT DETECTED Final   Mycoplasma pneumoniae NOT DETECTED NOT DETECTED Final    Comment: Performed at [facility], [address] [place], [place] [address]  MRSA PCR Screening     Status: None   Collection Time: 09/03/17  1:13 AM  Result Value Ref Range Status   MRSA by PCR NEGATIVE NEGATIVE Final    Comment:        The GeneXpert MRSA Assay (FDA approved for NASAL specimens only), is one component of a comprehensive MRSA colonization surveillance program. It is not intended to diagnose MRSA infection nor to guide or monitor treatment for MRSA infections. Performed at [facility], [address] [place], [place] [address]   Culture, blood (Routine X 2) w Reflex to ID Panel     Status: None (Preliminary  result)   Collection Time: 09/05/17  6:02 PM  Result Value Ref Range Status   Specimen Description   Final    BLOOD LEFT HAND Performed at [facility], [address], [place], [place] [address]    Special Requests   Final    BOTTLES DRAWN AEROBIC AND ANAEROBIC Blood Culture results may not be optimal due to an inadequate volume of blood received in culture bottles Performed at [facility], [address] [place], [place] [address]    Culture   Final    NO GROWTH < 24 HOURS Performed at [facility], [address], [place], [place] [address]    Report Status PENDING  Incomplete  Culture, blood (Routine X 2) w Reflex to ID Panel     Status: None (Preliminary result)   Collection Time: 09/05/17  6:18 PM  Result Value Ref Range Status   Specimen Description   Final    BLOOD LEFT ANTECUBITAL Performed at [facility], [address], [place], [place] [address]    Special Requests   Final    BOTTLES DRAWN AEROBIC AND ANAEROBIC Blood Culture adequate volume Performed at [facility], [address] [place], [place] [address]    Culture   Final    NO GROWTH < 24 HOURS Performed at [facility], [address] [place], [place] [address]    Report Status PENDING  Incomplete   Labs: BNP (last 3 results) No results for input(s): BNP in the last 8760 hours. Basic Metabolic Panel: Recent Labs  Lab 09/03/17 0329 09/04/17 0338 09/04/17 0722 09/05/17 0503 09/06/17 0455 09/07/17 0410  NA 145  --  140 140 141 139  K 3.6  --  3.8 3.1* 4.6 3.7  CL 111  --  108 106 108 104  CO2 19*  --  19* 23 23 27   GLUCOSE 147*  --  136* 143* 141* 110*  BUN 63*  --  47* 39* 23* 12  CREATININE 1.96* 1.42* 1.46* 1.23* 0.93 0.85  CALCIUM 8.4*  --  8.9 8.2* 8.3* 8.2*  MG 2.1  --   --   --   --  1.3*  PHOS 3.7  --   --   --   --  3.1   Liver Function Tests: Recent Labs  Lab 09/02/17 1644 09/03/17 0329   AST 57* 87*  ALT 37 62*  ALKPHOS 52 62  BILITOT 0.5 0.7  PROT 6.4* 6.7  ALBUMIN 2.8* 2.9*   No results for input(s): LIPASE, AMYLASE in the last 168 hours. Recent Labs  Lab 09/03/17 0037  AMMONIA 14   CBC: Recent Labs  Lab 09/02/17 1644 09/03/17 0329 09/04/17 0722 09/05/17 0503 09/06/17 0455 09/07/17 0410  WBC 19.7* 18.8* 12.8* 10.4 11.1* 8.4  NEUTROABS 16.3*  --   --   --   --   --   HGB 11.4* 11.2* 11.7* 9.7* 10.3* 10.4*  HCT 35.6* 35.0* 36.9 30.4* 31.4* 32.5*  MCV 92.7 91.9 92.0 91.6 89.5 92.1  PLT 248 266 253 232 253 259   Cardiac Enzymes: Recent Labs  Lab 09/03/17 0037  CKTOTAL 454*   BNP: Invalid input(s): POCBNP CBG: No results for input(s): GLUCAP in the last 168 hours. D-Dimer No results for input(s): DDIMER in the last 72 hours. Hgb A1c No results for input(s): HGBA1C in the last 72 hours. Lipid Profile No results for input(s): CHOL, HDL, LDLCALC, TRIG, CHOLHDL, LDLDIRECT in the last 72 hours. Thyroid function studies No results for input(s): TSH, T4TOTAL, T3FREE, THYROIDAB in the last 72 hours.  Invalid input(s): FREET3 Anemia work up No results for input(s): VITAMINB12, FOLATE, FERRITIN, TIBC, IRON, RETICCTPCT in the last 72 hours. Urinalysis    Component Value Date/Time   COLORURINE YELLOW 09/02/2017 1650   APPEARANCEUR CLEAR 09/02/2017 1650   APPEARANCEUR Clear 08/08/2017 0820   LABSPEC 1.010 09/02/2017 1650   PHURINE 6.0 09/02/2017 1650   GLUCOSEU NEGATIVE 09/02/2017 1650   HGBUR NEGATIVE 09/02/2017 1650   BILIRUBINUR NEGATIVE 09/02/2017 1650   BILIRUBINUR Negative 08/08/2017 0820   KETONESUR NEGATIVE 09/02/2017 1650   PROTEINUR NEGATIVE 09/02/2017 1650   NITRITE NEGATIVE 09/02/2017 1650   LEUKOCYTESUR NEGATIVE 09/02/2017 1650   LEUKOCYTESUR Negative 08/08/2017 0820   Sepsis Labs Invalid input(s): PROCALCITONIN,  WBC,  LACTICIDVEN Microbiology Recent Results (from the past 240 hour(s))  Blood Culture (routine x 2)     Status:  None (Preliminary result)   Collection Time: 09/02/17  4:44 PM  Result Value Ref Range Status   Specimen Description   Final    BLOOD LEFT FOREARM Performed at [facility], [address] [place], [place] [address]    Special Requests   Final    BOTTLES DRAWN AEROBIC AND ANAEROBIC Blood Culture results may not be optimal due to an excessive volume of blood received in culture bottles Performed at [facility], [address] [place], [place] [address]    Culture   Final    NO GROWTH 4 DAYS Performed at [facility], [address] [place], [place] [address]    Report Status PENDING  Incomplete  Blood Culture (routine x 2)     Status: None (Preliminary result)   Collection Time: 09/02/17  4:51 PM  Result Value Ref Range Status   Specimen Description   Final    BLOOD LEFT ANTECUBITAL Performed at [facility], [address], [place], [place] [address]    Special Requests   Final    BOTTLES DRAWN AEROBIC AND ANAEROBIC Blood Culture adequate volume Performed at [facility], [address] [place], [place] [address]    Culture   Final    NO GROWTH 4 DAYS Performed at [facility], [address], [place], [place] [address]    Report Status PENDING  Incomplete  Respiratory Panel by PCR     Status: None   Collection Time: 09/03/17 12:50 AM  Result Value Ref Range Status   Adenovirus NOT DETECTED NOT DETECTED Final   Coronavirus 229E NOT DETECTED NOT DETECTED  Final   Coronavirus HKU1 NOT DETECTED NOT DETECTED Final   Coronavirus NL63 NOT DETECTED NOT DETECTED Final   Coronavirus OC43 NOT DETECTED NOT DETECTED Final   Metapneumovirus NOT DETECTED NOT DETECTED Final   Rhinovirus / Enterovirus NOT DETECTED NOT DETECTED Final   Influenza A NOT DETECTED NOT DETECTED Final   Influenza B NOT DETECTED NOT DETECTED Final   Parainfluenza Virus 1 NOT DETECTED NOT DETECTED Final   Parainfluenza  Virus 2 NOT DETECTED NOT DETECTED Final   Parainfluenza Virus 3 NOT DETECTED NOT DETECTED Final   Parainfluenza Virus 4 NOT DETECTED NOT DETECTED Final   Respiratory Syncytial Virus NOT DETECTED NOT DETECTED Final   Bordetella pertussis NOT DETECTED NOT DETECTED Final   Chlamydophila pneumoniae NOT DETECTED NOT DETECTED Final   Mycoplasma pneumoniae NOT DETECTED NOT DETECTED Final    Comment: Performed at [facility], [address] [place], [place] [address]  MRSA PCR Screening     Status: None   Collection Time: 09/03/17  1:13 AM  Result Value Ref Range Status   MRSA by PCR NEGATIVE NEGATIVE Final    Comment:        The GeneXpert MRSA Assay (FDA approved for NASAL specimens only), is one component of a comprehensive MRSA colonization surveillance program. It is not intended to diagnose MRSA infection nor to guide or monitor treatment for MRSA infections. Performed at [facility], [address], [place], [place] [address]   Culture, blood (Routine X 2) w Reflex to ID Panel     Status: None (Preliminary result)   Collection Time: 09/05/17  6:02 PM  Result Value Ref Range Status   Specimen Description   Final    BLOOD LEFT HAND Performed at [facility], [address] [place], [place] [address]    Special Requests   Final    BOTTLES DRAWN AEROBIC AND ANAEROBIC Blood Culture results may not be optimal due to an inadequate volume of blood received in culture bottles Performed at [facility], [address], [place], [place] [address]    Culture   Final    NO GROWTH < 24 HOURS Performed at [facility], [address], [place], [place] [address]    Report Status PENDING  Incomplete  Culture, blood (Routine X 2) w Reflex to ID Panel     Status: None (Preliminary result)   Collection Time: 09/05/17  6:18 PM  Result Value Ref Range Status   Specimen Description   Final    BLOOD LEFT ANTECUBITAL Performed  at [facility], [address], [place], [place] [address]    Special Requests   Final    BOTTLES DRAWN AEROBIC AND ANAEROBIC Blood Culture adequate volume Performed at [facility], [address] [place], [place] [address]    Culture   Final    NO GROWTH < 24 HOURS Performed at [facility], [address], [place], [place] [address]    Report Status PENDING  Incomplete   Time coordinating discharge: 35 minutes  SIGNED:  Merlene Laughter, DO [facility] 09/07/2017, 12:38 PM Pager [phone]  If 7PM-7AM, please contact night-coverage [email or web address] Password [email or web address]

## 2017-09-07 NOTE — Progress Notes (Signed)
CSW contacted patient's daughter to provide bed offers, no answer. CSW left voicemail requesting return phone call.   CSW spoke with patient at bedside and provided bed offers, patient reported that she needed to speak with her daughter before selecting SNF. CSW informed patient that patient's daughter was called and voicemail was left. CSW informed of the importance of selecting a SNF to start insurance authorization. CSW contacted patient's daughter in the room, patient's daughter answered and requested to call CSW back in ten minutes.  CSW received return call from patient's daughter. CSW explained SNF process and insurance authorization, patient's daughter verbalized understanding. CSW provided patient's daughter with bed offers, patient's daughter selected [facility] SNF.   CSW spoke with patient at bedside and provided update of [facility] selection. Patient reported that she is agreeable with [facility] SNF.  CSW received a call from patient's daughter with a new SNF selection, [facility]. Patient's daughter reported that the SNF has good google reviews.  CSW updated patient, patient reported that she is agreeable with new SNF selection [facility].  CSW contacted [facility] SNF and spoke with admissions staff member Clydie Braun. Staff confirmed bed offer and agreed to start [facility] authorization.   CSW will continue to follow and assist with discharge planning.  Celso Sickle, [place] Clinical Social Worker [facility] Cell#: [phone]

## 2017-09-07 NOTE — Progress Notes (Signed)
MRI of cervical and thoracic spine showed chronic appearing patchy multifocal signal abnormalities and spinal cord atrophy consistent with the patient's known diagnosis of MS. No findings suggestive of acute demyelination were seen.   A/R: 72 year old female with known MS, presenting with fever and AMS secondary to infection.  1. Given her clinical improvement with antibiotics and MRI scans showing no acute demyelination, there is no indication for IV steroids. 2. Neurology will sign off. Please call if there are additional questions.   Electronically signed: Dr. Caryl Pina

## 2017-09-07 NOTE — Progress Notes (Signed)
CSW following to assist with discharge planning to [facility]. Patient's [employer] authorization, pending. Patient unable to private pay for SNF, SNF will not accept 5 day LOG for insurance authorization. CSW updated patient's attending MD. CSW will continue to follow and assist with discharge planning.  Celso Sickle, [place] Clinical Social Worker [facility] Cell#: [phone]

## 2017-09-07 NOTE — Progress Notes (Signed)
Patient converted to AFIB and then back to NSR. Will continue to monitor.

## 2017-09-08 DIAGNOSIS — I482 Chronic atrial fibrillation: Secondary | ICD-10-CM | POA: Diagnosis not present

## 2017-09-08 DIAGNOSIS — M6281 Muscle weakness (generalized): Secondary | ICD-10-CM | POA: Diagnosis not present

## 2017-09-08 DIAGNOSIS — R6511 Systemic inflammatory response syndrome (SIRS) of non-infectious origin with acute organ dysfunction: Secondary | ICD-10-CM | POA: Diagnosis not present

## 2017-09-08 DIAGNOSIS — N179 Acute kidney failure, unspecified: Secondary | ICD-10-CM | POA: Diagnosis not present

## 2017-09-08 DIAGNOSIS — E876 Hypokalemia: Secondary | ICD-10-CM

## 2017-09-08 DIAGNOSIS — N390 Urinary tract infection, site not specified: Secondary | ICD-10-CM | POA: Diagnosis not present

## 2017-09-08 DIAGNOSIS — I10 Essential (primary) hypertension: Secondary | ICD-10-CM | POA: Diagnosis not present

## 2017-09-08 DIAGNOSIS — H1132 Conjunctival hemorrhage, left eye: Secondary | ICD-10-CM | POA: Diagnosis not present

## 2017-09-08 DIAGNOSIS — R509 Fever, unspecified: Secondary | ICD-10-CM

## 2017-09-08 DIAGNOSIS — I4891 Unspecified atrial fibrillation: Secondary | ICD-10-CM | POA: Diagnosis not present

## 2017-09-08 DIAGNOSIS — E039 Hypothyroidism, unspecified: Secondary | ICD-10-CM | POA: Diagnosis not present

## 2017-09-08 DIAGNOSIS — H1045 Other chronic allergic conjunctivitis: Secondary | ICD-10-CM | POA: Diagnosis not present

## 2017-09-08 DIAGNOSIS — N319 Neuromuscular dysfunction of bladder, unspecified: Secondary | ICD-10-CM | POA: Diagnosis not present

## 2017-09-08 DIAGNOSIS — M8589 Other specified disorders of bone density and structure, multiple sites: Secondary | ICD-10-CM | POA: Diagnosis not present

## 2017-09-08 DIAGNOSIS — I480 Paroxysmal atrial fibrillation: Secondary | ICD-10-CM | POA: Diagnosis not present

## 2017-09-08 DIAGNOSIS — R1312 Dysphagia, oropharyngeal phase: Secondary | ICD-10-CM | POA: Diagnosis not present

## 2017-09-08 DIAGNOSIS — R309 Painful micturition, unspecified: Secondary | ICD-10-CM | POA: Diagnosis not present

## 2017-09-08 DIAGNOSIS — R41841 Cognitive communication deficit: Secondary | ICD-10-CM | POA: Diagnosis not present

## 2017-09-08 DIAGNOSIS — G35 Multiple sclerosis: Secondary | ICD-10-CM | POA: Diagnosis not present

## 2017-09-08 LAB — CBC WITH DIFFERENTIAL/PLATELET
Basophils Absolute: 0 10*3/uL (ref 0.0–0.1)
Basophils Relative: 0 %
EOS ABS: 0.1 10*3/uL (ref 0.0–0.7)
Eosinophils Relative: 2 %
HCT: 31.5 % — ABNORMAL LOW (ref 36.0–46.0)
HEMOGLOBIN: 9.8 g/dL — AB (ref 12.0–15.0)
LYMPHS ABS: 1.2 10*3/uL (ref 0.7–4.0)
Lymphocytes Relative: 20 %
MCH: 28.6 pg (ref 26.0–34.0)
MCHC: 31.1 g/dL (ref 30.0–36.0)
MCV: 91.8 fL (ref 78.0–100.0)
Monocytes Absolute: 0.5 10*3/uL (ref 0.1–1.0)
Monocytes Relative: 7 %
NEUTROS ABS: 4.4 10*3/uL (ref 1.7–7.7)
NEUTROS PCT: 71 %
Platelets: 328 10*3/uL (ref 150–400)
RBC: 3.43 MIL/uL — AB (ref 3.87–5.11)
RDW: 13.8 % (ref 11.5–15.5)
WBC: 6.2 10*3/uL (ref 4.0–10.5)

## 2017-09-08 LAB — COMPREHENSIVE METABOLIC PANEL
ALBUMIN: 2.1 g/dL — AB (ref 3.5–5.0)
ALK PHOS: 59 U/L (ref 38–126)
ALT: 39 U/L (ref 14–54)
AST: 36 U/L (ref 15–41)
Anion gap: 10 (ref 5–15)
BUN: 9 mg/dL (ref 6–20)
CALCIUM: 7.9 mg/dL — AB (ref 8.9–10.3)
CO2: 29 mmol/L (ref 22–32)
CREATININE: 0.85 mg/dL (ref 0.44–1.00)
Chloride: 100 mmol/L — ABNORMAL LOW (ref 101–111)
GFR calc non Af Amer: >60 mL/min (ref >60)
GLUCOSE: 127 mg/dL — AB (ref 65–99)
Potassium: 3.2 mmol/L — ABNORMAL LOW (ref 3.5–5.1)
SODIUM: 139 mmol/L (ref 135–145)
Total Bilirubin: 0.5 mg/dL (ref 0.3–1.2)
Total Protein: 5.4 g/dL — ABNORMAL LOW (ref 6.5–8.1)

## 2017-09-08 LAB — MAGNESIUM: Magnesium: 1.6 mg/dL — ABNORMAL LOW (ref 1.7–2.4)

## 2017-09-08 LAB — PHOSPHORUS: Phosphorus: 2.6 mg/dL (ref 2.5–4.6)

## 2017-09-08 MED ORDER — MAGNESIUM SULFATE 2 GM/50ML IV SOLN
2.0000 g | Freq: Once | INTRAVENOUS | Status: AC
  Administered 2017-09-08: 2 g via INTRAVENOUS
  Filled 2017-09-08: qty 50

## 2017-09-08 MED ORDER — POTASSIUM CHLORIDE 20 MEQ PO PACK
40.0000 meq | PACK | Freq: Two times a day (BID) | ORAL | Status: DC
  Administered 2017-09-08: 40 meq via ORAL
  Filled 2017-09-08 (×2): qty 2

## 2017-09-08 MED ORDER — POTASSIUM CHLORIDE CRYS ER 20 MEQ PO TBCR: 40.0000 meq | EXTENDED_RELEASE_TABLET | Freq: Two times a day (BID) | ORAL | Status: DC

## 2017-09-08 MED ORDER — K PHOS MONO-SOD PHOS DI & MONO 155-852-130 MG PO TABS
500.0000 mg | ORAL_TABLET | Freq: Every day | ORAL | Status: DC
  Administered 2017-09-08: 500 mg via ORAL
  Filled 2017-09-08: qty 2

## 2017-09-08 NOTE — Progress Notes (Signed)
Report called to Susa Raring at [facility] care at 1630.

## 2017-09-08 NOTE — Clinical Social Work Placement (Signed)
Patient received and accepted bed offer at [facility]. Facility aware of patient's discharge and confirmed bed offer (insurance authorization received). PTAR contacted, patient's family notified. Patient's RN can call report to [phone], packet complete. CSW signing off, no other needs identified at this time.  CLINICAL SOCIAL WORK PLACEMENT  NOTE  Date:  09/08/2017  Patient Details  Name: Jillian Fisher MRN: [id number] Date of Birth: [birth year]  Clinical Social Work is seeking post-discharge placement for this patient at the Skilled  Nursing Facility level of care (*CSW will initial, date and re-position this form in  chart as items are completed):  Yes   Patient/family provided with [place] Clinical Social Work Department's list of facilities offering this level of care within the geographic area requested by the patient (or if unable, by the patient's family).  Yes   Patient/family informed of their freedom to choose among providers that offer the needed level of care, that participate in Medicare, Medicaid or managed care program needed by the patient, have an available bed and are willing to accept the patient.  Yes   Patient/family informed of [facility]'s ownership interest in [facility] and [facility], as well as of the fact that they are under no obligation to receive care at these facilities.  PASRR submitted to EDS on       PASRR number received on 09/05/17     Existing PASRR number confirmed on       FL2 transmitted to all facilities in geographic area requested by pt/family on 09/05/17     FL2 transmitted to all facilities within larger geographic area on       Patient informed that his/her managed care company has contracts with or will negotiate with certain facilities, including the following:        Yes   Patient/family informed of bed offers received.  Patient chooses bed at [facility]     Physician recommends  and patient chooses bed at      Patient to be transferred to [facility] on 09/08/17.  Patient to be transferred to facility by PTAR     Patient family notified on 09/08/17 of transfer.  Name of family member notified:  sandra Goedde     PHYSICIAN       Additional Comment:    _______________________________________________ Antionette Poles, LCSW 09/08/2017, 4:00 PM

## 2017-09-08 NOTE — Progress Notes (Signed)
Physical Therapy Treatment Patient Details Name: Jillian Fisher MRN: [id number] DOB: Aug 24, 1945 Today's Date: 09/08/2017    History of Present Illness Pt admitted through ED with lethargy, fever, AKI, dehydration and with hx of MS and a-fib    PT Comments    General Comments: flat and distant at first but with inceased conversation became more engaged.  Assisted to EOB. General bed mobility comments: required Max Total Assist to achieve staic sitting EOB.  Once positioned in midline, pt able to support self at [facility] Assist approx 7 min before c/o fatigue.  Very Rigid throughout and noted R side weaker than Left.  Pt was using L hand to support self and nothing R UE.  L LE placed on floor correctly, R curled inward.   Pt unable to weight shift self only static sit.  Transfer General transfer comment: attempted sit to stand + 2 side by side assist however R knee buckled and pt unable to clear hips off surface.  Used "NIKE" front tech to squat pivot 1/4 turn from bed to recliner when noted loose stools.  Pt 5%.  Assisted off recliner to [facility] same "Bear Hug" 1/4 pivot.  Pt  unable to stand upright and unable to pivot feet.  R knee buckle.  Pt sat BSC x10 min at Supervision.  Assisted with 4th transfer off [facility] "Bear Hug" with second assist behind for hygiene.  Pt 0% at this point very fatigued.  Transfered and positioned in recliner with Lift Pad under for nursing to use.   Pt will need ST Rehab at SNF prior to returning home with daughter.   Follow Up Recommendations  SNF     Equipment Recommendations  None recommended by PT    Recommendations for Other Services       Precautions / Restrictions Precautions Precautions: Fall Precaution Comments: Hx MS Restrictions Weight Bearing Restrictions: No    Mobility  Bed Mobility Overal bed mobility: Needs Assistance   Rolling: Max assist;Total assist;+2 for safety/equipment     Sit to supine: +2 for physical assistance;+2 for  safety/equipment;Max assist;Total assist   General bed mobility comments: required Max Total Assist to achieve staic sitting EOB.  Once positioned in midline, pt able to support self at [facility] Assist approx 7 min before c/o fatigue.  Very Rigid throughout and noted R side weaker than Left.  Pt was using L hand to support self and nothing R UE.  L LE placed on floor correctly, R curled inward.   Pt unable to weight shift self only static sit.    Transfers Overall transfer level: Needs assistance Equipment used: Rolling walker (2 wheeled);None Transfers: Sit to/from [employer] Transfers   Stand pivot transfers: Total assist;+2 physical assistance;+2 safety/equipment;From elevated surface       General transfer comment: attempted sit to stand + 2 side by side assist however R knee buckled and pt unable to clear hips off surface.  Used "NIKE" front tech to squat pivot 1/4 turn from bed to recliner when noted loose stools.  Pt 5%.  Assisted off recliner to [facility] same "Bear Hug" 1/4 pivot.  Pt  unable to stand upright and unable to pivot feet.  R knee buckle.  Pt sat BSC x10 min at Supervision.  Assisted with 4th transfer off [facility] "Bear Hug" with second assist behind for hygiene.  Pt 0% at this point very fatigued.  Transfered and positioned in recliner with Lift Pad under for nursing to  use.    Ambulation/Gait             General Gait Details: unable to attempt due to poor trasfer ability   Stairs             Wheelchair Mobility    Modified Rankin (Stroke Patients Only)       Balance                                            Cognition Arousal/Alertness: Awake/alert Behavior During Therapy: Flat affect Overall Cognitive Status: No family/caregiver present to determine baseline cognitive functioning                                 General Comments: flat and distant at first but with inceased conversation  became more engaged      Exercises      General Comments        Pertinent Vitals/Pain Pain Assessment: Faces Faces Pain Scale: Hurts a little bit Pain Location: overall stiffness Pain Descriptors / Indicators: Tightness Pain Intervention(s): Monitored during session;Repositioned    Home Living                      Prior Function            PT Goals (current goals can now be found in the care plan section) Progress towards PT goals: Progressing toward goals    Frequency    Min 3X/week      PT Plan Current plan remains appropriate    Co-evaluation              AM-PAC PT "6 Clicks" Daily Activity  Outcome Measure  Difficulty turning over in bed (including adjusting bedclothes, sheets and blankets)?: Unable Difficulty moving from lying on back to sitting on the side of the bed? : Unable Difficulty sitting down on and standing up from a chair with arms (e.g., wheelchair, bedside commode, etc,.)?: Unable Help needed moving to and from a bed to chair (including a wheelchair)?: Total Help needed walking in hospital room?: Total Help needed climbing 3-5 steps with a railing? : Total 6 Click Score: 6    End of Session Equipment Utilized During Treatment: Gait belt Activity Tolerance: Patient limited by fatigue Patient left: in chair;with chair alarm set;with call bell/phone within reach Nurse Communication: Need for lift equipment PT Visit Diagnosis: Muscle weakness (generalized) (M62.81);Difficulty in walking, not elsewhere classified (R26.2)     Time: 1914-7829 PT Time Calculation (min) (ACUTE ONLY): 32 min  Charges:  $Therapeutic Activity: 23-37 mins                    G Codes:       Felecia Shelling  PTA [facility]  Acute  Rehab Pager      [phone]

## 2017-09-09 DIAGNOSIS — I10 Essential (primary) hypertension: Secondary | ICD-10-CM | POA: Diagnosis not present

## 2017-09-09 DIAGNOSIS — G35 Multiple sclerosis: Secondary | ICD-10-CM | POA: Diagnosis not present

## 2017-09-09 DIAGNOSIS — N319 Neuromuscular dysfunction of bladder, unspecified: Secondary | ICD-10-CM | POA: Diagnosis not present

## 2017-09-09 DIAGNOSIS — I482 Chronic atrial fibrillation: Secondary | ICD-10-CM | POA: Diagnosis not present

## 2017-09-10 LAB — CULTURE, BLOOD (ROUTINE X 2): CULTURE: NO GROWTH

## 2017-09-11 LAB — BLOOD CULTURE ID PANEL (REFLEXED)
ACINETOBACTER BAUMANNII: NOT DETECTED
CANDIDA ALBICANS: NOT DETECTED
CANDIDA GLABRATA: NOT DETECTED
CANDIDA PARAPSILOSIS: NOT DETECTED
CANDIDA TROPICALIS: NOT DETECTED
Candida krusei: NOT DETECTED
ENTEROBACTERIACEAE SPECIES: NOT DETECTED
Enterobacter cloacae complex: NOT DETECTED
Enterococcus species: NOT DETECTED
Escherichia coli: NOT DETECTED
HAEMOPHILUS INFLUENZAE: NOT DETECTED
KLEBSIELLA OXYTOCA: NOT DETECTED
KLEBSIELLA PNEUMONIAE: NOT DETECTED
Listeria monocytogenes: NOT DETECTED
Neisseria meningitidis: NOT DETECTED
PROTEUS SPECIES: NOT DETECTED
Pseudomonas aeruginosa: NOT DETECTED
STREPTOCOCCUS SPECIES: NOT DETECTED
Serratia marcescens: NOT DETECTED
Staphylococcus aureus (BCID): NOT DETECTED
Staphylococcus species: NOT DETECTED
Streptococcus agalactiae: NOT DETECTED
Streptococcus pneumoniae: NOT DETECTED
Streptococcus pyogenes: NOT DETECTED

## 2017-09-13 DIAGNOSIS — H1132 Conjunctival hemorrhage, left eye: Secondary | ICD-10-CM | POA: Diagnosis not present

## 2017-09-13 DIAGNOSIS — I480 Paroxysmal atrial fibrillation: Secondary | ICD-10-CM | POA: Diagnosis not present

## 2017-09-13 DIAGNOSIS — G35 Multiple sclerosis: Secondary | ICD-10-CM | POA: Diagnosis not present

## 2017-09-13 DIAGNOSIS — H1045 Other chronic allergic conjunctivitis: Secondary | ICD-10-CM | POA: Diagnosis not present

## 2017-09-13 LAB — CULTURE, BLOOD (ROUTINE X 2): Special Requests: ADEQUATE

## 2017-09-19 ENCOUNTER — Telehealth: Payer: Self-pay | Admitting: *Deleted

## 2017-09-19 NOTE — Telephone Encounter (Signed)
Faxed signed orders back to [facility] re: "SN1D5. SN to administer IV solu-medrol 500mg  daily x5 days. Begin date 09/02/17-09/06/17". Fax: [phone]. Received fax confirmation.

## 2017-09-27 DIAGNOSIS — M6281 Muscle weakness (generalized): Secondary | ICD-10-CM | POA: Diagnosis not present

## 2017-09-27 DIAGNOSIS — R309 Painful micturition, unspecified: Secondary | ICD-10-CM | POA: Diagnosis not present

## 2017-09-27 DIAGNOSIS — N390 Urinary tract infection, site not specified: Secondary | ICD-10-CM | POA: Diagnosis not present

## 2017-09-28 ENCOUNTER — Other Ambulatory Visit: Payer: Self-pay | Admitting: Neurology

## 2017-10-05 DIAGNOSIS — I10 Essential (primary) hypertension: Secondary | ICD-10-CM | POA: Diagnosis not present

## 2017-10-05 DIAGNOSIS — I480 Paroxysmal atrial fibrillation: Secondary | ICD-10-CM | POA: Diagnosis not present

## 2017-10-05 DIAGNOSIS — G35 Multiple sclerosis: Secondary | ICD-10-CM | POA: Diagnosis not present

## 2017-10-05 DIAGNOSIS — M6281 Muscle weakness (generalized): Secondary | ICD-10-CM | POA: Diagnosis not present

## 2017-10-05 DIAGNOSIS — R41841 Cognitive communication deficit: Secondary | ICD-10-CM | POA: Diagnosis not present

## 2017-10-10 ENCOUNTER — Telehealth: Payer: Self-pay | Admitting: Neurology

## 2017-10-10 NOTE — Telephone Encounter (Signed)
Patient's daughter [name] calling stating patient has been discharged from the hospital and also rehab. Should she continue taking Methylprednisolone which she says Dr. Anne Hahn prescribed from having a MS relapse. I advised Dr. Anne Hahn is out of the office and will send to his nurse.

## 2017-10-10 NOTE — Telephone Encounter (Signed)
Called [name] back (on [place]). She states her mother was discharged on 10/08/17 from rehab at [facility] health/senior living. She was being treated for UTI there. Daughter has noticed some inability for her to use her legs still. She needs assistance to stand, get to bathroom. She is wondering if Dr. Anne Hahn wants her to have another round of IV steroidsx5 days for MS.  [facility] set her up with home health therapy via [place]. Their initial visit/eval will be today. They are planning on PT/OT, speech eval, nurse for vitals.   Advised Dr. Anne Hahn out of the office but I will speak with WID, Dr. Epimenio Foot for recommendations. I will call her back to advise. She verbalized understanding.

## 2017-10-10 NOTE — Telephone Encounter (Addendum)
I called Jillian Fisher back and relayed Dr. Bonnita Hollow recommendations. She will keep [place] up to date and call back if anything else is needed. She will have home health forward notes to keep [place] informed. Pt has upcoming appt with MM,NP 10/25/17.

## 2017-10-10 NOTE — Telephone Encounter (Signed)
Per Dr. Epimenio Foot- He would like her to be evaluated by home health and go through therapy. Does not recommend IV steroids at this time.

## 2017-10-11 DIAGNOSIS — N1330 Unspecified hydronephrosis: Secondary | ICD-10-CM | POA: Diagnosis not present

## 2017-10-11 DIAGNOSIS — N319 Neuromuscular dysfunction of bladder, unspecified: Secondary | ICD-10-CM | POA: Diagnosis not present

## 2017-10-11 DIAGNOSIS — G35 Multiple sclerosis: Secondary | ICD-10-CM | POA: Diagnosis not present

## 2017-10-11 DIAGNOSIS — G500 Trigeminal neuralgia: Secondary | ICD-10-CM | POA: Diagnosis not present

## 2017-10-11 DIAGNOSIS — I480 Paroxysmal atrial fibrillation: Secondary | ICD-10-CM | POA: Diagnosis not present

## 2017-10-11 DIAGNOSIS — I10 Essential (primary) hypertension: Secondary | ICD-10-CM | POA: Diagnosis not present

## 2017-10-12 DIAGNOSIS — I10 Essential (primary) hypertension: Secondary | ICD-10-CM | POA: Diagnosis not present

## 2017-10-12 DIAGNOSIS — G500 Trigeminal neuralgia: Secondary | ICD-10-CM | POA: Diagnosis not present

## 2017-10-12 DIAGNOSIS — N1330 Unspecified hydronephrosis: Secondary | ICD-10-CM | POA: Diagnosis not present

## 2017-10-12 DIAGNOSIS — N319 Neuromuscular dysfunction of bladder, unspecified: Secondary | ICD-10-CM | POA: Diagnosis not present

## 2017-10-12 DIAGNOSIS — I480 Paroxysmal atrial fibrillation: Secondary | ICD-10-CM | POA: Diagnosis not present

## 2017-10-12 DIAGNOSIS — G35 Multiple sclerosis: Secondary | ICD-10-CM | POA: Diagnosis not present

## 2017-10-17 DIAGNOSIS — N319 Neuromuscular dysfunction of bladder, unspecified: Secondary | ICD-10-CM | POA: Diagnosis not present

## 2017-10-17 DIAGNOSIS — G500 Trigeminal neuralgia: Secondary | ICD-10-CM | POA: Diagnosis not present

## 2017-10-17 DIAGNOSIS — I480 Paroxysmal atrial fibrillation: Secondary | ICD-10-CM | POA: Diagnosis not present

## 2017-10-17 DIAGNOSIS — N1330 Unspecified hydronephrosis: Secondary | ICD-10-CM | POA: Diagnosis not present

## 2017-10-17 DIAGNOSIS — I10 Essential (primary) hypertension: Secondary | ICD-10-CM | POA: Diagnosis not present

## 2017-10-17 DIAGNOSIS — G35 Multiple sclerosis: Secondary | ICD-10-CM | POA: Diagnosis not present

## 2017-10-18 DIAGNOSIS — G35 Multiple sclerosis: Secondary | ICD-10-CM | POA: Diagnosis not present

## 2017-10-18 DIAGNOSIS — N1330 Unspecified hydronephrosis: Secondary | ICD-10-CM | POA: Diagnosis not present

## 2017-10-18 DIAGNOSIS — G500 Trigeminal neuralgia: Secondary | ICD-10-CM | POA: Diagnosis not present

## 2017-10-18 DIAGNOSIS — I480 Paroxysmal atrial fibrillation: Secondary | ICD-10-CM | POA: Diagnosis not present

## 2017-10-18 DIAGNOSIS — N319 Neuromuscular dysfunction of bladder, unspecified: Secondary | ICD-10-CM | POA: Diagnosis not present

## 2017-10-18 DIAGNOSIS — I10 Essential (primary) hypertension: Secondary | ICD-10-CM | POA: Diagnosis not present

## 2017-10-19 DIAGNOSIS — N1330 Unspecified hydronephrosis: Secondary | ICD-10-CM | POA: Diagnosis not present

## 2017-10-19 DIAGNOSIS — E039 Hypothyroidism, unspecified: Secondary | ICD-10-CM | POA: Diagnosis not present

## 2017-10-19 DIAGNOSIS — N3281 Overactive bladder: Secondary | ICD-10-CM | POA: Diagnosis not present

## 2017-10-19 DIAGNOSIS — G500 Trigeminal neuralgia: Secondary | ICD-10-CM | POA: Diagnosis not present

## 2017-10-19 DIAGNOSIS — N319 Neuromuscular dysfunction of bladder, unspecified: Secondary | ICD-10-CM | POA: Diagnosis not present

## 2017-10-19 DIAGNOSIS — E7800 Pure hypercholesterolemia, unspecified: Secondary | ICD-10-CM | POA: Diagnosis not present

## 2017-10-19 DIAGNOSIS — G35 Multiple sclerosis: Secondary | ICD-10-CM | POA: Diagnosis not present

## 2017-10-19 DIAGNOSIS — I10 Essential (primary) hypertension: Secondary | ICD-10-CM | POA: Diagnosis not present

## 2017-10-19 DIAGNOSIS — I480 Paroxysmal atrial fibrillation: Secondary | ICD-10-CM | POA: Diagnosis not present

## 2017-10-20 DIAGNOSIS — I10 Essential (primary) hypertension: Secondary | ICD-10-CM | POA: Diagnosis not present

## 2017-10-20 DIAGNOSIS — G35 Multiple sclerosis: Secondary | ICD-10-CM | POA: Diagnosis not present

## 2017-10-20 DIAGNOSIS — N1330 Unspecified hydronephrosis: Secondary | ICD-10-CM | POA: Diagnosis not present

## 2017-10-20 DIAGNOSIS — G500 Trigeminal neuralgia: Secondary | ICD-10-CM | POA: Diagnosis not present

## 2017-10-20 DIAGNOSIS — N319 Neuromuscular dysfunction of bladder, unspecified: Secondary | ICD-10-CM | POA: Diagnosis not present

## 2017-10-20 DIAGNOSIS — I480 Paroxysmal atrial fibrillation: Secondary | ICD-10-CM | POA: Diagnosis not present

## 2017-10-21 DIAGNOSIS — N1330 Unspecified hydronephrosis: Secondary | ICD-10-CM | POA: Diagnosis not present

## 2017-10-21 DIAGNOSIS — G500 Trigeminal neuralgia: Secondary | ICD-10-CM | POA: Diagnosis not present

## 2017-10-21 DIAGNOSIS — N319 Neuromuscular dysfunction of bladder, unspecified: Secondary | ICD-10-CM | POA: Diagnosis not present

## 2017-10-21 DIAGNOSIS — I480 Paroxysmal atrial fibrillation: Secondary | ICD-10-CM | POA: Diagnosis not present

## 2017-10-21 DIAGNOSIS — G35 Multiple sclerosis: Secondary | ICD-10-CM | POA: Diagnosis not present

## 2017-10-21 DIAGNOSIS — I10 Essential (primary) hypertension: Secondary | ICD-10-CM | POA: Diagnosis not present

## 2017-10-24 DIAGNOSIS — G35 Multiple sclerosis: Secondary | ICD-10-CM | POA: Diagnosis not present

## 2017-10-24 DIAGNOSIS — I10 Essential (primary) hypertension: Secondary | ICD-10-CM | POA: Diagnosis not present

## 2017-10-24 DIAGNOSIS — N1330 Unspecified hydronephrosis: Secondary | ICD-10-CM | POA: Diagnosis not present

## 2017-10-24 DIAGNOSIS — N319 Neuromuscular dysfunction of bladder, unspecified: Secondary | ICD-10-CM | POA: Diagnosis not present

## 2017-10-24 DIAGNOSIS — G500 Trigeminal neuralgia: Secondary | ICD-10-CM | POA: Diagnosis not present

## 2017-10-24 DIAGNOSIS — I480 Paroxysmal atrial fibrillation: Secondary | ICD-10-CM | POA: Diagnosis not present

## 2017-10-25 ENCOUNTER — Ambulatory Visit (INDEPENDENT_AMBULATORY_CARE_PROVIDER_SITE_OTHER): Payer: Medicare HMO | Admitting: Adult Health

## 2017-10-25 ENCOUNTER — Encounter: Payer: Self-pay | Admitting: Adult Health

## 2017-10-25 VITALS — BP 109/62 | HR 90

## 2017-10-25 DIAGNOSIS — G35 Multiple sclerosis: Secondary | ICD-10-CM | POA: Diagnosis not present

## 2017-10-25 DIAGNOSIS — G35D Multiple sclerosis, unspecified: Secondary | ICD-10-CM

## 2017-10-25 DIAGNOSIS — M1990 Unspecified osteoarthritis, unspecified site: Secondary | ICD-10-CM | POA: Diagnosis not present

## 2017-10-25 DIAGNOSIS — I480 Paroxysmal atrial fibrillation: Secondary | ICD-10-CM | POA: Diagnosis not present

## 2017-10-25 DIAGNOSIS — I10 Essential (primary) hypertension: Secondary | ICD-10-CM | POA: Diagnosis not present

## 2017-10-25 DIAGNOSIS — N1330 Unspecified hydronephrosis: Secondary | ICD-10-CM | POA: Diagnosis not present

## 2017-10-25 DIAGNOSIS — G500 Trigeminal neuralgia: Secondary | ICD-10-CM | POA: Diagnosis not present

## 2017-10-25 DIAGNOSIS — N319 Neuromuscular dysfunction of bladder, unspecified: Secondary | ICD-10-CM | POA: Diagnosis not present

## 2017-10-25 NOTE — Progress Notes (Signed)
I have read the note, and I agree with the clinical assessment and plan.  Devany Aja K Damen [place]   

## 2017-10-25 NOTE — Progress Notes (Signed)
PATIENT: Jillian Fisher DOB: 1946-03-16  REASON FOR VISIT: follow up HISTORY FROM: patient  HISTORY OF PRESENT ILLNESS: Today 10/25/17:  Jillian Fisher is a 72 year old female with a history of multiple sclerosis with chronic gait disorder.  She returns today for follow-up.  She was recently in the hospital for urinary tract infection.  She was discharged to [facility].  She is now back at home.  She continues to have a Foley catheter in place.  She has seen her primary care who completed blood work and referred her to urology to have the catheter removed.  The patient states that she has been participating  in-home rehab.  She continues to use a wheelchair.  She is unable to ambulate.  She requires assistance with transfers.  Her daughter reports that she has gotten stronger.  Denies any changes with the bowels or bladder.  No new numbness or weakness.  She continues to be slightly weaker in the right lower extremity.  No change in vision.  She remains on tecfidera.  HISTORYMs. Woodward is a 72 year old right-handed white female with a history of multiple sclerosis associated with a chronic gait disorder.  The patient was recently seen in April 2019 with onset of trigeminal neuralgia affecting the right V3 distribution.  The patient was placed on Trileptal, this has improved the pain significantly.  The patient however has had onset of right leg weakness that began around 23 July 2017.  The patient claims at the weakness came on over several days, not suddenly.  The patient denies any numbness associated with this.  The patient was placed on a prednisone Dosepak and has improved with her right leg strength at or near her baseline.  The patient is now back to walking with a walker, she was nonambulatory for some time after onset of the right leg weakness.  She denies any change in bowel bladder function, she has not had any weakness of the arms, she denies any vision changes or slurred speech, or  difficulty swallowing.  She denies any headache or dizziness or any visual complaints.  The patient has not had any falls.  She denies any fevers or chills or any evidence of a bladder infection.  She does have a history of hypertension and atrial fibrillation on anticoagulant therapy.  She returns to this office on an urgent basis.      REVIEW OF SYSTEMS: Out of a complete 14 system review of symptoms, the patient complains only of the following symptoms, and all other reviewed systems are negative.  See HPI  ALLERGIES: Allergies  Allergen Reactions  . Sulfa Antibiotics Rash    HOME MEDICATIONS: Outpatient Medications Prior to Visit  Medication Sig Dispense Refill  . ALPRAZolam (XANAX) 0.25 MG tablet Take 1 tablet (0.25 mg total) by mouth daily as needed for anxiety. 5 tablet 0  . apixaban (ELIQUIS) 5 MG TABS tablet Take 1 tablet (5 mg total) by mouth 2 (two) times daily. 60 tablet 0  . diclofenac sodium (VOLTAREN) 1 % GEL Apply 2 g topically 4 (four) times daily.    Marland Kitchen diltiazem (CARDIZEM CD) 240 MG 24 hr capsule Take 1 capsule (240 mg total) by mouth daily. 30 capsule 0  . Dimethyl Fumarate (TECFIDERA) 120 & 240 MG MISC Take 1 Dose by mouth 2 (two) times daily.     . feeding supplement, ENSURE ENLIVE, (ENSURE ENLIVE) LIQD Take 237 mLs by mouth 2 (two) times daily between meals. 237 mL 12  . gabapentin (  NEURONTIN) 300 MG capsule Take 1 capsule (300 mg total) by mouth 4 (four) times daily. 90 capsule 11  . hydrALAZINE (APRESOLINE) 25 MG tablet Take 1 tablet (25 mg total) by mouth every 8 (eight) hours.    Marland Kitchen levothyroxine (SYNTHROID, LEVOTHROID) 125 MCG tablet Take 125 mcg by mouth daily before breakfast.   3  . metoprolol tartrate (LOPRESSOR) 50 MG tablet Take 1 tablet (50 mg total) by mouth 2 (two) times daily.    . Multiple Vitamin (MULTIVITAMIN WITH MINERALS) TABS Take 1 tablet by mouth daily.    Marland Kitchen omeprazole (PRILOSEC) 20 MG capsule Take 20 mg by mouth daily as needed (reflux).       . OXcarbazepine (TRILEPTAL) 150 MG tablet One tablet twice a day for 1 week, then take 2 tablets twice a day (Patient taking differently: Take 150 mg by mouth 2 (two) times daily. ) 120 tablet 3  . rosuvastatin (CRESTOR) 20 MG tablet Take 10 mg by mouth daily.      No facility-administered medications prior to visit.     PAST MEDICAL HISTORY: Past Medical History:  Diagnosis Date  . A-fib (HCC)   . Abnormality of gait 01/25/2013  . Arthritis   . GERD (gastroesophageal reflux disease)   . Hypercholesteremia   . Hypertension   . Hypothyroidism   . Multiple sclerosis (HCC)   . Neurogenic bladder 01/25/2013  . Trigeminal neuralgia 07/07/2017   Right V3    PAST SURGICAL HISTORY: Past Surgical History:  Procedure Laterality Date  . ABDOMINAL HYSTERECTOMY      FAMILY HISTORY: Family History  Problem Relation Age of Onset  . Heart attack Mother   . Multiple sclerosis Sister   . Heart disease Sister   . Diabetes Other     SOCIAL HISTORY: Social History   Socioeconomic History  . Marital status: Married    Spouse name: [name]  . Number of children: 2  . Years of education: 8 th  . Highest education level: Not on file  Occupational History  . Occupation: retired  Engineer, production  . Financial resource strain: Not on file  . Food insecurity:    Worry: Not on file    Inability: Not on file  . Transportation needs:    Medical: Not on file    Non-medical: Not on file  Tobacco Use  . Smoking status: Never Smoker  . Smokeless tobacco: Never Used  . Tobacco comment: quit 30-40 years ago (documented in 2013)  Substance and Sexual Activity  . Alcohol use: No    Alcohol/week: 0.0 oz  . Drug use: No  . Sexual activity: Not on file  Lifestyle  . Physical activity:    Days per week: Not on file    Minutes per session: Not on file  . Stress: Not on file  Relationships  . Social connections:    Talks on phone: Not on file    Gets together: Not on file    Attends religious  service: Not on file    Active member of club or organization: Not on file    Attends meetings of clubs or organizations: Not on file    Relationship status: Not on file  . Intimate partner violence:    Fear of current or ex partner: Not on file    Emotionally abused: Not on file    Physically abused: Not on file    Forced sexual activity: Not on file  Other Topics Concern  . Not on file  Social History Narrative   Patient lives at home with her husband.   Retired.   Education 8 th grade   Right handed.   Caffeine none.      PHYSICAL EXAM  Vitals:   10/25/17 0843  BP: 109/62  Pulse: 90   There is no height or weight on file to calculate BMI.  Generalized: Well developed, in no acute distress   Neurological examination  Mentation: Alert oriented to time, place, history taking. Follows all commands speech and language fluent Cranial nerve II-XII: Pupils were equal round reactive to light. Extraocular movements were full, visual field were full on confrontational test. Facial sensation and strength were normal. Uvula tongue midline. Head turning and shoulder shrug  were normal and symmetric. Motor: The motor testing reveals 5 over 5 strength of all 4 extremities with the exception of 3/5 strength in the right lower extremity.  Good symmetric motor tone is noted throughout.  Sensory: Sensory testing is intact to soft touch on all 4 extremities. No evidence of extinction is noted.  Coordination: Cerebellar testing reveals good finger-nose-finger bilaterally.  Some difficulty with heel-to-shin on the right side. Gait and station: Patient is in a wheelchair.  We did not attempt to stand or ambulate today.   DIAGNOSTIC DATA (LABS, IMAGING, TESTING) - I reviewed patient records, labs, notes, testing and imaging myself where available.  Lab Results  Component Value Date   WBC 6.2 09/08/2017   HGB 9.8 (L) 09/08/2017   HCT 31.5 (L) 09/08/2017   MCV 91.8 09/08/2017   PLT 328  09/08/2017      Component Value Date/Time   NA 139 09/08/2017 0822   NA 148 (H) 08/08/2017 0808   K 3.2 (L) 09/08/2017 0822   CL 100 (L) 09/08/2017 0822   CO2 29 09/08/2017 0822   GLUCOSE 127 (H) 09/08/2017 0822   BUN 9 09/08/2017 0822   BUN 15 08/08/2017 0808   CREATININE 0.85 09/08/2017 0822   CALCIUM 7.9 (L) 09/08/2017 0822   PROT 5.4 (L) 09/08/2017 0822   PROT 6.7 08/08/2017 0808   ALBUMIN 2.1 (L) 09/08/2017 0822   ALBUMIN 3.7 08/08/2017 0808   AST 36 09/08/2017 0822   ALT 39 09/08/2017 0822   ALKPHOS 59 09/08/2017 0822   BILITOT 0.5 09/08/2017 0822   BILITOT 0.3 08/08/2017 0808   GFRNONAA >60 09/08/2017 0822   GFRAA >60 09/08/2017 1610    Lab Results  Component Value Date   TSH 3.017 09/03/2017      ASSESSMENT AND PLAN 72 y.o. year old female  has a past medical history of A-fib (HCC), Abnormality of gait (01/25/2013), Arthritis, GERD (gastroesophageal reflux disease), Hypercholesteremia, Hypertension, Hypothyroidism, Multiple sclerosis (HCC), Neurogenic bladder (01/25/2013), and Trigeminal neuralgia (07/07/2017). here with  1.  Multiple sclerosis 2.  Trigeminal neuralgia  Overall the patient has remained stable.  She will continue on Tecfidera.  She will remain on Trileptal for trigeminal neuralgia.  Encouraged patient to continue to participate in in-home therapy.  She recently had blood work through her primary care.  I have asked that they have this faxed to our office.  If her symptoms worsen or she develops new symptoms she should let us know.  She will follow-up in 6 months or sooner if needed.    Butch Penny, MSN, NP-C 10/25/2017, 8:43 AM [facility] [address] [place], [place] [address] [phone]

## 2017-10-25 NOTE — Patient Instructions (Signed)
Your Plan:  Continue tecfidera Have blood work sent to our office If your symptoms worsen or you develop new symptoms please let us know.   Thank you for coming to see [place] at [facility]. I hope we have been able to provide you high quality care today.  You may receive a patient satisfaction survey over the next few weeks. We would appreciate your feedback and comments so that we may continue to improve ourselves and the health of our patients.

## 2017-10-26 DIAGNOSIS — I480 Paroxysmal atrial fibrillation: Secondary | ICD-10-CM | POA: Diagnosis not present

## 2017-10-26 DIAGNOSIS — M1990 Unspecified osteoarthritis, unspecified site: Secondary | ICD-10-CM | POA: Diagnosis not present

## 2017-10-26 DIAGNOSIS — G35 Multiple sclerosis: Secondary | ICD-10-CM | POA: Diagnosis not present

## 2017-10-26 DIAGNOSIS — I10 Essential (primary) hypertension: Secondary | ICD-10-CM | POA: Diagnosis not present

## 2017-10-26 DIAGNOSIS — N1330 Unspecified hydronephrosis: Secondary | ICD-10-CM | POA: Diagnosis not present

## 2017-10-26 DIAGNOSIS — G500 Trigeminal neuralgia: Secondary | ICD-10-CM | POA: Diagnosis not present

## 2017-10-26 DIAGNOSIS — N319 Neuromuscular dysfunction of bladder, unspecified: Secondary | ICD-10-CM | POA: Diagnosis not present

## 2017-10-27 DIAGNOSIS — N319 Neuromuscular dysfunction of bladder, unspecified: Secondary | ICD-10-CM | POA: Diagnosis not present

## 2017-10-27 DIAGNOSIS — N1330 Unspecified hydronephrosis: Secondary | ICD-10-CM | POA: Diagnosis not present

## 2017-10-27 DIAGNOSIS — G500 Trigeminal neuralgia: Secondary | ICD-10-CM | POA: Diagnosis not present

## 2017-10-27 DIAGNOSIS — M1990 Unspecified osteoarthritis, unspecified site: Secondary | ICD-10-CM | POA: Diagnosis not present

## 2017-10-27 DIAGNOSIS — G35 Multiple sclerosis: Secondary | ICD-10-CM | POA: Diagnosis not present

## 2017-10-27 DIAGNOSIS — I10 Essential (primary) hypertension: Secondary | ICD-10-CM | POA: Diagnosis not present

## 2017-10-27 DIAGNOSIS — I480 Paroxysmal atrial fibrillation: Secondary | ICD-10-CM | POA: Diagnosis not present

## 2017-10-28 DIAGNOSIS — M1990 Unspecified osteoarthritis, unspecified site: Secondary | ICD-10-CM | POA: Diagnosis not present

## 2017-10-29 DIAGNOSIS — M1990 Unspecified osteoarthritis, unspecified site: Secondary | ICD-10-CM | POA: Diagnosis not present

## 2017-10-31 DIAGNOSIS — I10 Essential (primary) hypertension: Secondary | ICD-10-CM | POA: Diagnosis not present

## 2017-10-31 DIAGNOSIS — G35 Multiple sclerosis: Secondary | ICD-10-CM | POA: Diagnosis not present

## 2017-10-31 DIAGNOSIS — N1330 Unspecified hydronephrosis: Secondary | ICD-10-CM | POA: Diagnosis not present

## 2017-10-31 DIAGNOSIS — M1990 Unspecified osteoarthritis, unspecified site: Secondary | ICD-10-CM | POA: Diagnosis not present

## 2017-10-31 DIAGNOSIS — G500 Trigeminal neuralgia: Secondary | ICD-10-CM | POA: Diagnosis not present

## 2017-10-31 DIAGNOSIS — I480 Paroxysmal atrial fibrillation: Secondary | ICD-10-CM | POA: Diagnosis not present

## 2017-10-31 DIAGNOSIS — N319 Neuromuscular dysfunction of bladder, unspecified: Secondary | ICD-10-CM | POA: Diagnosis not present

## 2017-11-01 DIAGNOSIS — I10 Essential (primary) hypertension: Secondary | ICD-10-CM | POA: Diagnosis not present

## 2017-11-01 DIAGNOSIS — G35 Multiple sclerosis: Secondary | ICD-10-CM | POA: Diagnosis not present

## 2017-11-01 DIAGNOSIS — N319 Neuromuscular dysfunction of bladder, unspecified: Secondary | ICD-10-CM | POA: Diagnosis not present

## 2017-11-01 DIAGNOSIS — G500 Trigeminal neuralgia: Secondary | ICD-10-CM | POA: Diagnosis not present

## 2017-11-01 DIAGNOSIS — I480 Paroxysmal atrial fibrillation: Secondary | ICD-10-CM | POA: Diagnosis not present

## 2017-11-01 DIAGNOSIS — M1990 Unspecified osteoarthritis, unspecified site: Secondary | ICD-10-CM | POA: Diagnosis not present

## 2017-11-01 DIAGNOSIS — N1330 Unspecified hydronephrosis: Secondary | ICD-10-CM | POA: Diagnosis not present

## 2017-11-02 DIAGNOSIS — G500 Trigeminal neuralgia: Secondary | ICD-10-CM | POA: Diagnosis not present

## 2017-11-02 DIAGNOSIS — N319 Neuromuscular dysfunction of bladder, unspecified: Secondary | ICD-10-CM | POA: Diagnosis not present

## 2017-11-02 DIAGNOSIS — G35 Multiple sclerosis: Secondary | ICD-10-CM | POA: Diagnosis not present

## 2017-11-02 DIAGNOSIS — I480 Paroxysmal atrial fibrillation: Secondary | ICD-10-CM | POA: Diagnosis not present

## 2017-11-02 DIAGNOSIS — M1990 Unspecified osteoarthritis, unspecified site: Secondary | ICD-10-CM | POA: Diagnosis not present

## 2017-11-02 DIAGNOSIS — I10 Essential (primary) hypertension: Secondary | ICD-10-CM | POA: Diagnosis not present

## 2017-11-02 DIAGNOSIS — N1330 Unspecified hydronephrosis: Secondary | ICD-10-CM | POA: Diagnosis not present

## 2017-11-03 DIAGNOSIS — G35 Multiple sclerosis: Secondary | ICD-10-CM | POA: Diagnosis not present

## 2017-11-03 DIAGNOSIS — N319 Neuromuscular dysfunction of bladder, unspecified: Secondary | ICD-10-CM | POA: Diagnosis not present

## 2017-11-03 DIAGNOSIS — I480 Paroxysmal atrial fibrillation: Secondary | ICD-10-CM | POA: Diagnosis not present

## 2017-11-03 DIAGNOSIS — I10 Essential (primary) hypertension: Secondary | ICD-10-CM | POA: Diagnosis not present

## 2017-11-03 DIAGNOSIS — M1990 Unspecified osteoarthritis, unspecified site: Secondary | ICD-10-CM | POA: Diagnosis not present

## 2017-11-03 DIAGNOSIS — G500 Trigeminal neuralgia: Secondary | ICD-10-CM | POA: Diagnosis not present

## 2017-11-03 DIAGNOSIS — N1330 Unspecified hydronephrosis: Secondary | ICD-10-CM | POA: Diagnosis not present

## 2017-11-04 DIAGNOSIS — E039 Hypothyroidism, unspecified: Secondary | ICD-10-CM | POA: Diagnosis not present

## 2017-11-04 DIAGNOSIS — M1990 Unspecified osteoarthritis, unspecified site: Secondary | ICD-10-CM | POA: Diagnosis not present

## 2017-11-04 DIAGNOSIS — I4891 Unspecified atrial fibrillation: Secondary | ICD-10-CM | POA: Diagnosis not present

## 2017-11-04 DIAGNOSIS — M8589 Other specified disorders of bone density and structure, multiple sites: Secondary | ICD-10-CM | POA: Diagnosis not present

## 2017-11-04 DIAGNOSIS — G35 Multiple sclerosis: Secondary | ICD-10-CM | POA: Diagnosis not present

## 2017-11-05 DIAGNOSIS — M1990 Unspecified osteoarthritis, unspecified site: Secondary | ICD-10-CM | POA: Diagnosis not present

## 2017-11-07 DIAGNOSIS — G500 Trigeminal neuralgia: Secondary | ICD-10-CM | POA: Diagnosis not present

## 2017-11-07 DIAGNOSIS — I480 Paroxysmal atrial fibrillation: Secondary | ICD-10-CM | POA: Diagnosis not present

## 2017-11-07 DIAGNOSIS — G35 Multiple sclerosis: Secondary | ICD-10-CM | POA: Diagnosis not present

## 2017-11-07 DIAGNOSIS — N319 Neuromuscular dysfunction of bladder, unspecified: Secondary | ICD-10-CM | POA: Diagnosis not present

## 2017-11-07 DIAGNOSIS — I10 Essential (primary) hypertension: Secondary | ICD-10-CM | POA: Diagnosis not present

## 2017-11-07 DIAGNOSIS — M1990 Unspecified osteoarthritis, unspecified site: Secondary | ICD-10-CM | POA: Diagnosis not present

## 2017-11-07 DIAGNOSIS — N1330 Unspecified hydronephrosis: Secondary | ICD-10-CM | POA: Diagnosis not present

## 2017-11-08 DIAGNOSIS — I480 Paroxysmal atrial fibrillation: Secondary | ICD-10-CM | POA: Diagnosis not present

## 2017-11-08 DIAGNOSIS — M1990 Unspecified osteoarthritis, unspecified site: Secondary | ICD-10-CM | POA: Diagnosis not present

## 2017-11-08 DIAGNOSIS — N1330 Unspecified hydronephrosis: Secondary | ICD-10-CM | POA: Diagnosis not present

## 2017-11-08 DIAGNOSIS — G35 Multiple sclerosis: Secondary | ICD-10-CM | POA: Diagnosis not present

## 2017-11-08 DIAGNOSIS — N319 Neuromuscular dysfunction of bladder, unspecified: Secondary | ICD-10-CM | POA: Diagnosis not present

## 2017-11-08 DIAGNOSIS — I10 Essential (primary) hypertension: Secondary | ICD-10-CM | POA: Diagnosis not present

## 2017-11-08 DIAGNOSIS — G500 Trigeminal neuralgia: Secondary | ICD-10-CM | POA: Diagnosis not present

## 2017-11-09 DIAGNOSIS — M1990 Unspecified osteoarthritis, unspecified site: Secondary | ICD-10-CM | POA: Diagnosis not present

## 2017-11-10 DIAGNOSIS — N1330 Unspecified hydronephrosis: Secondary | ICD-10-CM | POA: Diagnosis not present

## 2017-11-10 DIAGNOSIS — M1990 Unspecified osteoarthritis, unspecified site: Secondary | ICD-10-CM | POA: Diagnosis not present

## 2017-11-10 DIAGNOSIS — G35 Multiple sclerosis: Secondary | ICD-10-CM | POA: Diagnosis not present

## 2017-11-10 DIAGNOSIS — N319 Neuromuscular dysfunction of bladder, unspecified: Secondary | ICD-10-CM | POA: Diagnosis not present

## 2017-11-10 DIAGNOSIS — I480 Paroxysmal atrial fibrillation: Secondary | ICD-10-CM | POA: Diagnosis not present

## 2017-11-10 DIAGNOSIS — G500 Trigeminal neuralgia: Secondary | ICD-10-CM | POA: Diagnosis not present

## 2017-11-10 DIAGNOSIS — I10 Essential (primary) hypertension: Secondary | ICD-10-CM | POA: Diagnosis not present

## 2017-11-11 DIAGNOSIS — I10 Essential (primary) hypertension: Secondary | ICD-10-CM | POA: Diagnosis not present

## 2017-11-11 DIAGNOSIS — M1990 Unspecified osteoarthritis, unspecified site: Secondary | ICD-10-CM | POA: Diagnosis not present

## 2017-11-11 DIAGNOSIS — I480 Paroxysmal atrial fibrillation: Secondary | ICD-10-CM | POA: Diagnosis not present

## 2017-11-11 DIAGNOSIS — N319 Neuromuscular dysfunction of bladder, unspecified: Secondary | ICD-10-CM | POA: Diagnosis not present

## 2017-11-11 DIAGNOSIS — G500 Trigeminal neuralgia: Secondary | ICD-10-CM | POA: Diagnosis not present

## 2017-11-11 DIAGNOSIS — G35 Multiple sclerosis: Secondary | ICD-10-CM | POA: Diagnosis not present

## 2017-11-11 DIAGNOSIS — N1330 Unspecified hydronephrosis: Secondary | ICD-10-CM | POA: Diagnosis not present

## 2017-11-12 DIAGNOSIS — M1990 Unspecified osteoarthritis, unspecified site: Secondary | ICD-10-CM | POA: Diagnosis not present

## 2017-11-14 DIAGNOSIS — M1990 Unspecified osteoarthritis, unspecified site: Secondary | ICD-10-CM | POA: Diagnosis not present

## 2017-11-15 DIAGNOSIS — M1990 Unspecified osteoarthritis, unspecified site: Secondary | ICD-10-CM | POA: Diagnosis not present

## 2017-11-15 DIAGNOSIS — G500 Trigeminal neuralgia: Secondary | ICD-10-CM | POA: Diagnosis not present

## 2017-11-15 DIAGNOSIS — I10 Essential (primary) hypertension: Secondary | ICD-10-CM | POA: Diagnosis not present

## 2017-11-15 DIAGNOSIS — N319 Neuromuscular dysfunction of bladder, unspecified: Secondary | ICD-10-CM | POA: Diagnosis not present

## 2017-11-15 DIAGNOSIS — I480 Paroxysmal atrial fibrillation: Secondary | ICD-10-CM | POA: Diagnosis not present

## 2017-11-15 DIAGNOSIS — G35 Multiple sclerosis: Secondary | ICD-10-CM | POA: Diagnosis not present

## 2017-11-15 DIAGNOSIS — N1330 Unspecified hydronephrosis: Secondary | ICD-10-CM | POA: Diagnosis not present

## 2017-11-16 DIAGNOSIS — M1990 Unspecified osteoarthritis, unspecified site: Secondary | ICD-10-CM | POA: Diagnosis not present

## 2017-11-17 DIAGNOSIS — G35 Multiple sclerosis: Secondary | ICD-10-CM | POA: Diagnosis not present

## 2017-11-17 DIAGNOSIS — N319 Neuromuscular dysfunction of bladder, unspecified: Secondary | ICD-10-CM | POA: Diagnosis not present

## 2017-11-17 DIAGNOSIS — G500 Trigeminal neuralgia: Secondary | ICD-10-CM | POA: Diagnosis not present

## 2017-11-17 DIAGNOSIS — N1330 Unspecified hydronephrosis: Secondary | ICD-10-CM | POA: Diagnosis not present

## 2017-11-17 DIAGNOSIS — M1990 Unspecified osteoarthritis, unspecified site: Secondary | ICD-10-CM | POA: Diagnosis not present

## 2017-11-17 DIAGNOSIS — I480 Paroxysmal atrial fibrillation: Secondary | ICD-10-CM | POA: Diagnosis not present

## 2017-11-17 DIAGNOSIS — I10 Essential (primary) hypertension: Secondary | ICD-10-CM | POA: Diagnosis not present

## 2017-11-18 DIAGNOSIS — E039 Hypothyroidism, unspecified: Secondary | ICD-10-CM | POA: Diagnosis not present

## 2017-11-18 DIAGNOSIS — R531 Weakness: Secondary | ICD-10-CM | POA: Diagnosis not present

## 2017-11-18 DIAGNOSIS — G35 Multiple sclerosis: Secondary | ICD-10-CM | POA: Diagnosis not present

## 2017-11-18 DIAGNOSIS — I4891 Unspecified atrial fibrillation: Secondary | ICD-10-CM | POA: Diagnosis not present

## 2017-11-18 DIAGNOSIS — R269 Unspecified abnormalities of gait and mobility: Secondary | ICD-10-CM | POA: Diagnosis not present

## 2017-11-18 DIAGNOSIS — M1990 Unspecified osteoarthritis, unspecified site: Secondary | ICD-10-CM | POA: Diagnosis not present

## 2017-11-18 DIAGNOSIS — M8589 Other specified disorders of bone density and structure, multiple sites: Secondary | ICD-10-CM | POA: Diagnosis not present

## 2017-11-19 DIAGNOSIS — M1990 Unspecified osteoarthritis, unspecified site: Secondary | ICD-10-CM | POA: Diagnosis not present

## 2017-11-21 DIAGNOSIS — M1990 Unspecified osteoarthritis, unspecified site: Secondary | ICD-10-CM | POA: Diagnosis not present

## 2017-11-22 DIAGNOSIS — G500 Trigeminal neuralgia: Secondary | ICD-10-CM | POA: Diagnosis not present

## 2017-11-22 DIAGNOSIS — I480 Paroxysmal atrial fibrillation: Secondary | ICD-10-CM | POA: Diagnosis not present

## 2017-11-22 DIAGNOSIS — G35 Multiple sclerosis: Secondary | ICD-10-CM | POA: Diagnosis not present

## 2017-11-22 DIAGNOSIS — M1990 Unspecified osteoarthritis, unspecified site: Secondary | ICD-10-CM | POA: Diagnosis not present

## 2017-11-22 DIAGNOSIS — N1330 Unspecified hydronephrosis: Secondary | ICD-10-CM | POA: Diagnosis not present

## 2017-11-22 DIAGNOSIS — N319 Neuromuscular dysfunction of bladder, unspecified: Secondary | ICD-10-CM | POA: Diagnosis not present

## 2017-11-22 DIAGNOSIS — I10 Essential (primary) hypertension: Secondary | ICD-10-CM | POA: Diagnosis not present

## 2017-11-23 DIAGNOSIS — M1990 Unspecified osteoarthritis, unspecified site: Secondary | ICD-10-CM | POA: Diagnosis not present

## 2017-11-23 DIAGNOSIS — N319 Neuromuscular dysfunction of bladder, unspecified: Secondary | ICD-10-CM | POA: Diagnosis not present

## 2017-11-23 DIAGNOSIS — I10 Essential (primary) hypertension: Secondary | ICD-10-CM | POA: Diagnosis not present

## 2017-11-23 DIAGNOSIS — N390 Urinary tract infection, site not specified: Secondary | ICD-10-CM | POA: Diagnosis not present

## 2017-11-23 DIAGNOSIS — G35 Multiple sclerosis: Secondary | ICD-10-CM | POA: Diagnosis not present

## 2017-11-23 DIAGNOSIS — G500 Trigeminal neuralgia: Secondary | ICD-10-CM | POA: Diagnosis not present

## 2017-11-23 DIAGNOSIS — I480 Paroxysmal atrial fibrillation: Secondary | ICD-10-CM | POA: Diagnosis not present

## 2017-11-23 DIAGNOSIS — N1330 Unspecified hydronephrosis: Secondary | ICD-10-CM | POA: Diagnosis not present

## 2017-11-24 DIAGNOSIS — M1990 Unspecified osteoarthritis, unspecified site: Secondary | ICD-10-CM | POA: Diagnosis not present

## 2017-11-24 DIAGNOSIS — N1330 Unspecified hydronephrosis: Secondary | ICD-10-CM | POA: Diagnosis not present

## 2017-11-24 DIAGNOSIS — I480 Paroxysmal atrial fibrillation: Secondary | ICD-10-CM | POA: Diagnosis not present

## 2017-11-24 DIAGNOSIS — N319 Neuromuscular dysfunction of bladder, unspecified: Secondary | ICD-10-CM | POA: Diagnosis not present

## 2017-11-24 DIAGNOSIS — G35 Multiple sclerosis: Secondary | ICD-10-CM | POA: Diagnosis not present

## 2017-11-24 DIAGNOSIS — G500 Trigeminal neuralgia: Secondary | ICD-10-CM | POA: Diagnosis not present

## 2017-11-24 DIAGNOSIS — I10 Essential (primary) hypertension: Secondary | ICD-10-CM | POA: Diagnosis not present

## 2017-11-25 DIAGNOSIS — M1990 Unspecified osteoarthritis, unspecified site: Secondary | ICD-10-CM | POA: Diagnosis not present

## 2017-11-26 DIAGNOSIS — M1990 Unspecified osteoarthritis, unspecified site: Secondary | ICD-10-CM | POA: Diagnosis not present

## 2017-11-28 DIAGNOSIS — M1990 Unspecified osteoarthritis, unspecified site: Secondary | ICD-10-CM | POA: Diagnosis not present

## 2017-11-28 DIAGNOSIS — N312 Flaccid neuropathic bladder, not elsewhere classified: Secondary | ICD-10-CM | POA: Diagnosis not present

## 2017-11-29 DIAGNOSIS — G500 Trigeminal neuralgia: Secondary | ICD-10-CM | POA: Diagnosis not present

## 2017-11-29 DIAGNOSIS — I10 Essential (primary) hypertension: Secondary | ICD-10-CM | POA: Diagnosis not present

## 2017-11-29 DIAGNOSIS — I480 Paroxysmal atrial fibrillation: Secondary | ICD-10-CM | POA: Diagnosis not present

## 2017-11-29 DIAGNOSIS — N319 Neuromuscular dysfunction of bladder, unspecified: Secondary | ICD-10-CM | POA: Diagnosis not present

## 2017-11-29 DIAGNOSIS — G35 Multiple sclerosis: Secondary | ICD-10-CM | POA: Diagnosis not present

## 2017-11-29 DIAGNOSIS — N1330 Unspecified hydronephrosis: Secondary | ICD-10-CM | POA: Diagnosis not present

## 2017-11-29 DIAGNOSIS — M1990 Unspecified osteoarthritis, unspecified site: Secondary | ICD-10-CM | POA: Diagnosis not present

## 2017-11-30 ENCOUNTER — Other Ambulatory Visit: Payer: Self-pay | Admitting: Neurology

## 2017-11-30 DIAGNOSIS — G500 Trigeminal neuralgia: Secondary | ICD-10-CM | POA: Diagnosis not present

## 2017-11-30 DIAGNOSIS — G35 Multiple sclerosis: Secondary | ICD-10-CM | POA: Diagnosis not present

## 2017-11-30 DIAGNOSIS — I480 Paroxysmal atrial fibrillation: Secondary | ICD-10-CM | POA: Diagnosis not present

## 2017-11-30 DIAGNOSIS — N319 Neuromuscular dysfunction of bladder, unspecified: Secondary | ICD-10-CM | POA: Diagnosis not present

## 2017-11-30 DIAGNOSIS — I10 Essential (primary) hypertension: Secondary | ICD-10-CM | POA: Diagnosis not present

## 2017-11-30 DIAGNOSIS — M1990 Unspecified osteoarthritis, unspecified site: Secondary | ICD-10-CM | POA: Diagnosis not present

## 2017-11-30 DIAGNOSIS — N1330 Unspecified hydronephrosis: Secondary | ICD-10-CM | POA: Diagnosis not present

## 2017-12-01 DIAGNOSIS — M1990 Unspecified osteoarthritis, unspecified site: Secondary | ICD-10-CM | POA: Diagnosis not present

## 2017-12-01 DIAGNOSIS — G35 Multiple sclerosis: Secondary | ICD-10-CM | POA: Diagnosis not present

## 2017-12-01 DIAGNOSIS — N319 Neuromuscular dysfunction of bladder, unspecified: Secondary | ICD-10-CM | POA: Diagnosis not present

## 2017-12-01 DIAGNOSIS — I480 Paroxysmal atrial fibrillation: Secondary | ICD-10-CM | POA: Diagnosis not present

## 2017-12-01 DIAGNOSIS — N1330 Unspecified hydronephrosis: Secondary | ICD-10-CM | POA: Diagnosis not present

## 2017-12-01 DIAGNOSIS — G500 Trigeminal neuralgia: Secondary | ICD-10-CM | POA: Diagnosis not present

## 2017-12-01 DIAGNOSIS — I10 Essential (primary) hypertension: Secondary | ICD-10-CM | POA: Diagnosis not present

## 2017-12-02 DIAGNOSIS — M1990 Unspecified osteoarthritis, unspecified site: Secondary | ICD-10-CM | POA: Diagnosis not present

## 2017-12-03 DIAGNOSIS — M1990 Unspecified osteoarthritis, unspecified site: Secondary | ICD-10-CM | POA: Diagnosis not present

## 2017-12-05 DIAGNOSIS — M8589 Other specified disorders of bone density and structure, multiple sites: Secondary | ICD-10-CM | POA: Diagnosis not present

## 2017-12-05 DIAGNOSIS — I4891 Unspecified atrial fibrillation: Secondary | ICD-10-CM | POA: Diagnosis not present

## 2017-12-05 DIAGNOSIS — G35 Multiple sclerosis: Secondary | ICD-10-CM | POA: Diagnosis not present

## 2017-12-05 DIAGNOSIS — E039 Hypothyroidism, unspecified: Secondary | ICD-10-CM | POA: Diagnosis not present

## 2017-12-06 DIAGNOSIS — I480 Paroxysmal atrial fibrillation: Secondary | ICD-10-CM | POA: Diagnosis not present

## 2017-12-06 DIAGNOSIS — N319 Neuromuscular dysfunction of bladder, unspecified: Secondary | ICD-10-CM | POA: Diagnosis not present

## 2017-12-06 DIAGNOSIS — G35 Multiple sclerosis: Secondary | ICD-10-CM | POA: Diagnosis not present

## 2017-12-06 DIAGNOSIS — N1330 Unspecified hydronephrosis: Secondary | ICD-10-CM | POA: Diagnosis not present

## 2017-12-06 DIAGNOSIS — I10 Essential (primary) hypertension: Secondary | ICD-10-CM | POA: Diagnosis not present

## 2017-12-06 DIAGNOSIS — G500 Trigeminal neuralgia: Secondary | ICD-10-CM | POA: Diagnosis not present

## 2017-12-07 DIAGNOSIS — N1330 Unspecified hydronephrosis: Secondary | ICD-10-CM | POA: Diagnosis not present

## 2017-12-07 DIAGNOSIS — E7800 Pure hypercholesterolemia, unspecified: Secondary | ICD-10-CM | POA: Diagnosis not present

## 2017-12-07 DIAGNOSIS — I10 Essential (primary) hypertension: Secondary | ICD-10-CM | POA: Diagnosis not present

## 2017-12-07 DIAGNOSIS — I480 Paroxysmal atrial fibrillation: Secondary | ICD-10-CM | POA: Diagnosis not present

## 2017-12-07 DIAGNOSIS — G35 Multiple sclerosis: Secondary | ICD-10-CM | POA: Diagnosis not present

## 2017-12-07 DIAGNOSIS — G500 Trigeminal neuralgia: Secondary | ICD-10-CM | POA: Diagnosis not present

## 2017-12-07 DIAGNOSIS — I351 Nonrheumatic aortic (valve) insufficiency: Secondary | ICD-10-CM | POA: Diagnosis not present

## 2017-12-07 DIAGNOSIS — N319 Neuromuscular dysfunction of bladder, unspecified: Secondary | ICD-10-CM | POA: Diagnosis not present

## 2017-12-08 DIAGNOSIS — R1312 Dysphagia, oropharyngeal phase: Secondary | ICD-10-CM | POA: Diagnosis not present

## 2017-12-08 DIAGNOSIS — L89312 Pressure ulcer of right buttock, stage 2: Secondary | ICD-10-CM | POA: Diagnosis not present

## 2017-12-08 DIAGNOSIS — N319 Neuromuscular dysfunction of bladder, unspecified: Secondary | ICD-10-CM | POA: Diagnosis not present

## 2017-12-08 DIAGNOSIS — G500 Trigeminal neuralgia: Secondary | ICD-10-CM | POA: Diagnosis not present

## 2017-12-08 DIAGNOSIS — Z466 Encounter for fitting and adjustment of urinary device: Secondary | ICD-10-CM | POA: Diagnosis not present

## 2017-12-08 DIAGNOSIS — I10 Essential (primary) hypertension: Secondary | ICD-10-CM | POA: Diagnosis not present

## 2017-12-08 DIAGNOSIS — G35 Multiple sclerosis: Secondary | ICD-10-CM | POA: Diagnosis not present

## 2017-12-08 DIAGNOSIS — I480 Paroxysmal atrial fibrillation: Secondary | ICD-10-CM | POA: Diagnosis not present

## 2017-12-08 DIAGNOSIS — N1330 Unspecified hydronephrosis: Secondary | ICD-10-CM | POA: Diagnosis not present

## 2017-12-10 DIAGNOSIS — N319 Neuromuscular dysfunction of bladder, unspecified: Secondary | ICD-10-CM | POA: Diagnosis not present

## 2017-12-10 DIAGNOSIS — I480 Paroxysmal atrial fibrillation: Secondary | ICD-10-CM | POA: Diagnosis not present

## 2017-12-10 DIAGNOSIS — I10 Essential (primary) hypertension: Secondary | ICD-10-CM | POA: Diagnosis not present

## 2017-12-10 DIAGNOSIS — R1312 Dysphagia, oropharyngeal phase: Secondary | ICD-10-CM | POA: Diagnosis not present

## 2017-12-10 DIAGNOSIS — L89312 Pressure ulcer of right buttock, stage 2: Secondary | ICD-10-CM | POA: Diagnosis not present

## 2017-12-10 DIAGNOSIS — G500 Trigeminal neuralgia: Secondary | ICD-10-CM | POA: Diagnosis not present

## 2017-12-10 DIAGNOSIS — Z466 Encounter for fitting and adjustment of urinary device: Secondary | ICD-10-CM | POA: Diagnosis not present

## 2017-12-10 DIAGNOSIS — G35 Multiple sclerosis: Secondary | ICD-10-CM | POA: Diagnosis not present

## 2017-12-10 DIAGNOSIS — N1330 Unspecified hydronephrosis: Secondary | ICD-10-CM | POA: Diagnosis not present

## 2017-12-13 DIAGNOSIS — G500 Trigeminal neuralgia: Secondary | ICD-10-CM | POA: Diagnosis not present

## 2017-12-13 DIAGNOSIS — Z466 Encounter for fitting and adjustment of urinary device: Secondary | ICD-10-CM | POA: Diagnosis not present

## 2017-12-13 DIAGNOSIS — N1330 Unspecified hydronephrosis: Secondary | ICD-10-CM | POA: Diagnosis not present

## 2017-12-13 DIAGNOSIS — R1312 Dysphagia, oropharyngeal phase: Secondary | ICD-10-CM | POA: Diagnosis not present

## 2017-12-13 DIAGNOSIS — I10 Essential (primary) hypertension: Secondary | ICD-10-CM | POA: Diagnosis not present

## 2017-12-13 DIAGNOSIS — N319 Neuromuscular dysfunction of bladder, unspecified: Secondary | ICD-10-CM | POA: Diagnosis not present

## 2017-12-13 DIAGNOSIS — I480 Paroxysmal atrial fibrillation: Secondary | ICD-10-CM | POA: Diagnosis not present

## 2017-12-13 DIAGNOSIS — G35 Multiple sclerosis: Secondary | ICD-10-CM | POA: Diagnosis not present

## 2017-12-13 DIAGNOSIS — L89312 Pressure ulcer of right buttock, stage 2: Secondary | ICD-10-CM | POA: Diagnosis not present

## 2017-12-14 DIAGNOSIS — N1330 Unspecified hydronephrosis: Secondary | ICD-10-CM | POA: Diagnosis not present

## 2017-12-14 DIAGNOSIS — G500 Trigeminal neuralgia: Secondary | ICD-10-CM | POA: Diagnosis not present

## 2017-12-14 DIAGNOSIS — Z466 Encounter for fitting and adjustment of urinary device: Secondary | ICD-10-CM | POA: Diagnosis not present

## 2017-12-14 DIAGNOSIS — I10 Essential (primary) hypertension: Secondary | ICD-10-CM | POA: Diagnosis not present

## 2017-12-14 DIAGNOSIS — N319 Neuromuscular dysfunction of bladder, unspecified: Secondary | ICD-10-CM | POA: Diagnosis not present

## 2017-12-14 DIAGNOSIS — L89312 Pressure ulcer of right buttock, stage 2: Secondary | ICD-10-CM | POA: Diagnosis not present

## 2017-12-14 DIAGNOSIS — G35 Multiple sclerosis: Secondary | ICD-10-CM | POA: Diagnosis not present

## 2017-12-14 DIAGNOSIS — R1312 Dysphagia, oropharyngeal phase: Secondary | ICD-10-CM | POA: Diagnosis not present

## 2017-12-14 DIAGNOSIS — I480 Paroxysmal atrial fibrillation: Secondary | ICD-10-CM | POA: Diagnosis not present

## 2017-12-16 DIAGNOSIS — R1312 Dysphagia, oropharyngeal phase: Secondary | ICD-10-CM | POA: Diagnosis not present

## 2017-12-16 DIAGNOSIS — G35 Multiple sclerosis: Secondary | ICD-10-CM | POA: Diagnosis not present

## 2017-12-16 DIAGNOSIS — I10 Essential (primary) hypertension: Secondary | ICD-10-CM | POA: Diagnosis not present

## 2017-12-16 DIAGNOSIS — G500 Trigeminal neuralgia: Secondary | ICD-10-CM | POA: Diagnosis not present

## 2017-12-16 DIAGNOSIS — L89312 Pressure ulcer of right buttock, stage 2: Secondary | ICD-10-CM | POA: Diagnosis not present

## 2017-12-16 DIAGNOSIS — I480 Paroxysmal atrial fibrillation: Secondary | ICD-10-CM | POA: Diagnosis not present

## 2017-12-16 DIAGNOSIS — N1330 Unspecified hydronephrosis: Secondary | ICD-10-CM | POA: Diagnosis not present

## 2017-12-16 DIAGNOSIS — N319 Neuromuscular dysfunction of bladder, unspecified: Secondary | ICD-10-CM | POA: Diagnosis not present

## 2017-12-16 DIAGNOSIS — Z466 Encounter for fitting and adjustment of urinary device: Secondary | ICD-10-CM | POA: Diagnosis not present

## 2017-12-19 DIAGNOSIS — E039 Hypothyroidism, unspecified: Secondary | ICD-10-CM | POA: Diagnosis not present

## 2017-12-19 DIAGNOSIS — R531 Weakness: Secondary | ICD-10-CM | POA: Diagnosis not present

## 2017-12-19 DIAGNOSIS — N1330 Unspecified hydronephrosis: Secondary | ICD-10-CM | POA: Diagnosis not present

## 2017-12-19 DIAGNOSIS — M8589 Other specified disorders of bone density and structure, multiple sites: Secondary | ICD-10-CM | POA: Diagnosis not present

## 2017-12-19 DIAGNOSIS — I10 Essential (primary) hypertension: Secondary | ICD-10-CM | POA: Diagnosis not present

## 2017-12-19 DIAGNOSIS — Z466 Encounter for fitting and adjustment of urinary device: Secondary | ICD-10-CM | POA: Diagnosis not present

## 2017-12-19 DIAGNOSIS — G500 Trigeminal neuralgia: Secondary | ICD-10-CM | POA: Diagnosis not present

## 2017-12-19 DIAGNOSIS — G35 Multiple sclerosis: Secondary | ICD-10-CM | POA: Diagnosis not present

## 2017-12-19 DIAGNOSIS — R269 Unspecified abnormalities of gait and mobility: Secondary | ICD-10-CM | POA: Diagnosis not present

## 2017-12-19 DIAGNOSIS — I480 Paroxysmal atrial fibrillation: Secondary | ICD-10-CM | POA: Diagnosis not present

## 2017-12-19 DIAGNOSIS — I4891 Unspecified atrial fibrillation: Secondary | ICD-10-CM | POA: Diagnosis not present

## 2017-12-19 DIAGNOSIS — N319 Neuromuscular dysfunction of bladder, unspecified: Secondary | ICD-10-CM | POA: Diagnosis not present

## 2017-12-19 DIAGNOSIS — R1312 Dysphagia, oropharyngeal phase: Secondary | ICD-10-CM | POA: Diagnosis not present

## 2017-12-19 DIAGNOSIS — L89312 Pressure ulcer of right buttock, stage 2: Secondary | ICD-10-CM | POA: Diagnosis not present

## 2017-12-20 DIAGNOSIS — N1330 Unspecified hydronephrosis: Secondary | ICD-10-CM | POA: Diagnosis not present

## 2017-12-20 DIAGNOSIS — I480 Paroxysmal atrial fibrillation: Secondary | ICD-10-CM | POA: Diagnosis not present

## 2017-12-20 DIAGNOSIS — Z466 Encounter for fitting and adjustment of urinary device: Secondary | ICD-10-CM | POA: Diagnosis not present

## 2017-12-20 DIAGNOSIS — G500 Trigeminal neuralgia: Secondary | ICD-10-CM | POA: Diagnosis not present

## 2017-12-20 DIAGNOSIS — L89312 Pressure ulcer of right buttock, stage 2: Secondary | ICD-10-CM | POA: Diagnosis not present

## 2017-12-20 DIAGNOSIS — R1312 Dysphagia, oropharyngeal phase: Secondary | ICD-10-CM | POA: Diagnosis not present

## 2017-12-20 DIAGNOSIS — I10 Essential (primary) hypertension: Secondary | ICD-10-CM | POA: Diagnosis not present

## 2017-12-20 DIAGNOSIS — G35 Multiple sclerosis: Secondary | ICD-10-CM | POA: Diagnosis not present

## 2017-12-20 DIAGNOSIS — N319 Neuromuscular dysfunction of bladder, unspecified: Secondary | ICD-10-CM | POA: Diagnosis not present

## 2017-12-21 DIAGNOSIS — R1312 Dysphagia, oropharyngeal phase: Secondary | ICD-10-CM | POA: Diagnosis not present

## 2017-12-21 DIAGNOSIS — I480 Paroxysmal atrial fibrillation: Secondary | ICD-10-CM | POA: Diagnosis not present

## 2017-12-21 DIAGNOSIS — G35 Multiple sclerosis: Secondary | ICD-10-CM | POA: Diagnosis not present

## 2017-12-21 DIAGNOSIS — N319 Neuromuscular dysfunction of bladder, unspecified: Secondary | ICD-10-CM | POA: Diagnosis not present

## 2017-12-21 DIAGNOSIS — G500 Trigeminal neuralgia: Secondary | ICD-10-CM | POA: Diagnosis not present

## 2017-12-21 DIAGNOSIS — N1330 Unspecified hydronephrosis: Secondary | ICD-10-CM | POA: Diagnosis not present

## 2017-12-21 DIAGNOSIS — Z466 Encounter for fitting and adjustment of urinary device: Secondary | ICD-10-CM | POA: Diagnosis not present

## 2017-12-21 DIAGNOSIS — L89312 Pressure ulcer of right buttock, stage 2: Secondary | ICD-10-CM | POA: Diagnosis not present

## 2017-12-21 DIAGNOSIS — I10 Essential (primary) hypertension: Secondary | ICD-10-CM | POA: Diagnosis not present

## 2017-12-22 DIAGNOSIS — I480 Paroxysmal atrial fibrillation: Secondary | ICD-10-CM | POA: Diagnosis not present

## 2017-12-22 DIAGNOSIS — Z466 Encounter for fitting and adjustment of urinary device: Secondary | ICD-10-CM | POA: Diagnosis not present

## 2017-12-22 DIAGNOSIS — L89312 Pressure ulcer of right buttock, stage 2: Secondary | ICD-10-CM | POA: Diagnosis not present

## 2017-12-22 DIAGNOSIS — N319 Neuromuscular dysfunction of bladder, unspecified: Secondary | ICD-10-CM | POA: Diagnosis not present

## 2017-12-22 DIAGNOSIS — R1312 Dysphagia, oropharyngeal phase: Secondary | ICD-10-CM | POA: Diagnosis not present

## 2017-12-22 DIAGNOSIS — N1330 Unspecified hydronephrosis: Secondary | ICD-10-CM | POA: Diagnosis not present

## 2017-12-22 DIAGNOSIS — G500 Trigeminal neuralgia: Secondary | ICD-10-CM | POA: Diagnosis not present

## 2017-12-22 DIAGNOSIS — G35 Multiple sclerosis: Secondary | ICD-10-CM | POA: Diagnosis not present

## 2017-12-22 DIAGNOSIS — I10 Essential (primary) hypertension: Secondary | ICD-10-CM | POA: Diagnosis not present

## 2017-12-23 DIAGNOSIS — R1312 Dysphagia, oropharyngeal phase: Secondary | ICD-10-CM | POA: Diagnosis not present

## 2017-12-23 DIAGNOSIS — G500 Trigeminal neuralgia: Secondary | ICD-10-CM | POA: Diagnosis not present

## 2017-12-23 DIAGNOSIS — I10 Essential (primary) hypertension: Secondary | ICD-10-CM | POA: Diagnosis not present

## 2017-12-23 DIAGNOSIS — G35 Multiple sclerosis: Secondary | ICD-10-CM | POA: Diagnosis not present

## 2017-12-23 DIAGNOSIS — N1330 Unspecified hydronephrosis: Secondary | ICD-10-CM | POA: Diagnosis not present

## 2017-12-23 DIAGNOSIS — L89312 Pressure ulcer of right buttock, stage 2: Secondary | ICD-10-CM | POA: Diagnosis not present

## 2017-12-23 DIAGNOSIS — N319 Neuromuscular dysfunction of bladder, unspecified: Secondary | ICD-10-CM | POA: Diagnosis not present

## 2017-12-23 DIAGNOSIS — Z466 Encounter for fitting and adjustment of urinary device: Secondary | ICD-10-CM | POA: Diagnosis not present

## 2017-12-23 DIAGNOSIS — I480 Paroxysmal atrial fibrillation: Secondary | ICD-10-CM | POA: Diagnosis not present

## 2017-12-26 DIAGNOSIS — G500 Trigeminal neuralgia: Secondary | ICD-10-CM | POA: Diagnosis not present

## 2017-12-26 DIAGNOSIS — N1330 Unspecified hydronephrosis: Secondary | ICD-10-CM | POA: Diagnosis not present

## 2017-12-26 DIAGNOSIS — G35 Multiple sclerosis: Secondary | ICD-10-CM | POA: Diagnosis not present

## 2017-12-26 DIAGNOSIS — L89312 Pressure ulcer of right buttock, stage 2: Secondary | ICD-10-CM | POA: Diagnosis not present

## 2017-12-26 DIAGNOSIS — Z466 Encounter for fitting and adjustment of urinary device: Secondary | ICD-10-CM | POA: Diagnosis not present

## 2017-12-26 DIAGNOSIS — I480 Paroxysmal atrial fibrillation: Secondary | ICD-10-CM | POA: Diagnosis not present

## 2017-12-26 DIAGNOSIS — I10 Essential (primary) hypertension: Secondary | ICD-10-CM | POA: Diagnosis not present

## 2017-12-26 DIAGNOSIS — N319 Neuromuscular dysfunction of bladder, unspecified: Secondary | ICD-10-CM | POA: Diagnosis not present

## 2017-12-26 DIAGNOSIS — R1312 Dysphagia, oropharyngeal phase: Secondary | ICD-10-CM | POA: Diagnosis not present

## 2017-12-27 DIAGNOSIS — G35 Multiple sclerosis: Secondary | ICD-10-CM | POA: Diagnosis not present

## 2017-12-27 DIAGNOSIS — L89312 Pressure ulcer of right buttock, stage 2: Secondary | ICD-10-CM | POA: Diagnosis not present

## 2017-12-27 DIAGNOSIS — R1312 Dysphagia, oropharyngeal phase: Secondary | ICD-10-CM | POA: Diagnosis not present

## 2017-12-27 DIAGNOSIS — N319 Neuromuscular dysfunction of bladder, unspecified: Secondary | ICD-10-CM | POA: Diagnosis not present

## 2017-12-27 DIAGNOSIS — I480 Paroxysmal atrial fibrillation: Secondary | ICD-10-CM | POA: Diagnosis not present

## 2017-12-27 DIAGNOSIS — I10 Essential (primary) hypertension: Secondary | ICD-10-CM | POA: Diagnosis not present

## 2017-12-27 DIAGNOSIS — G500 Trigeminal neuralgia: Secondary | ICD-10-CM | POA: Diagnosis not present

## 2017-12-27 DIAGNOSIS — Z466 Encounter for fitting and adjustment of urinary device: Secondary | ICD-10-CM | POA: Diagnosis not present

## 2017-12-27 DIAGNOSIS — N1330 Unspecified hydronephrosis: Secondary | ICD-10-CM | POA: Diagnosis not present

## 2017-12-28 DIAGNOSIS — L89312 Pressure ulcer of right buttock, stage 2: Secondary | ICD-10-CM | POA: Diagnosis not present

## 2017-12-28 DIAGNOSIS — I480 Paroxysmal atrial fibrillation: Secondary | ICD-10-CM | POA: Diagnosis not present

## 2017-12-28 DIAGNOSIS — Z466 Encounter for fitting and adjustment of urinary device: Secondary | ICD-10-CM | POA: Diagnosis not present

## 2017-12-28 DIAGNOSIS — G35 Multiple sclerosis: Secondary | ICD-10-CM | POA: Diagnosis not present

## 2017-12-28 DIAGNOSIS — G500 Trigeminal neuralgia: Secondary | ICD-10-CM | POA: Diagnosis not present

## 2017-12-28 DIAGNOSIS — N319 Neuromuscular dysfunction of bladder, unspecified: Secondary | ICD-10-CM | POA: Diagnosis not present

## 2017-12-28 DIAGNOSIS — I10 Essential (primary) hypertension: Secondary | ICD-10-CM | POA: Diagnosis not present

## 2017-12-28 DIAGNOSIS — N1330 Unspecified hydronephrosis: Secondary | ICD-10-CM | POA: Diagnosis not present

## 2017-12-28 DIAGNOSIS — R1312 Dysphagia, oropharyngeal phase: Secondary | ICD-10-CM | POA: Diagnosis not present

## 2017-12-29 DIAGNOSIS — G500 Trigeminal neuralgia: Secondary | ICD-10-CM | POA: Diagnosis not present

## 2017-12-29 DIAGNOSIS — N319 Neuromuscular dysfunction of bladder, unspecified: Secondary | ICD-10-CM | POA: Diagnosis not present

## 2017-12-29 DIAGNOSIS — G35 Multiple sclerosis: Secondary | ICD-10-CM | POA: Diagnosis not present

## 2017-12-29 DIAGNOSIS — L89312 Pressure ulcer of right buttock, stage 2: Secondary | ICD-10-CM | POA: Diagnosis not present

## 2017-12-29 DIAGNOSIS — Z466 Encounter for fitting and adjustment of urinary device: Secondary | ICD-10-CM | POA: Diagnosis not present

## 2017-12-29 DIAGNOSIS — N1330 Unspecified hydronephrosis: Secondary | ICD-10-CM | POA: Diagnosis not present

## 2017-12-29 DIAGNOSIS — I480 Paroxysmal atrial fibrillation: Secondary | ICD-10-CM | POA: Diagnosis not present

## 2017-12-29 DIAGNOSIS — R1312 Dysphagia, oropharyngeal phase: Secondary | ICD-10-CM | POA: Diagnosis not present

## 2017-12-29 DIAGNOSIS — I10 Essential (primary) hypertension: Secondary | ICD-10-CM | POA: Diagnosis not present

## 2018-01-02 ENCOUNTER — Telehealth: Payer: Self-pay | Admitting: Neurology

## 2018-01-02 MED ORDER — OXCARBAZEPINE 300 MG PO TABS: 300.0000 mg | ORAL_TABLET | Freq: Two times a day (BID) | ORAL | 3 refills | Status: DC

## 2018-01-02 NOTE — Telephone Encounter (Signed)
I called back and left a message.  We had talked earlier about increasing the Trileptal dose from 150 mg twice daily to 300 mg twice daily.  The patient is also on gabapentin 300 mg capsules taking 1 4 times daily.  I did not intend to increase the gabapentin, we talked about increasing the Trileptal.

## 2018-01-02 NOTE — Telephone Encounter (Signed)
Patient's daughter [name] (on [place]) stating patient's trigeminal neuralgia pain has returned. OXcarbazepine (TRILEPTAL) 150 MG tablet and gabapentin (NEURONTIN) 300 MG capsule are not helping. Please call to disuss possible laser surgery.

## 2018-01-02 NOTE — Telephone Encounter (Signed)
Pt daughter has called back to let Dr Anne Hahn know that re: the gabapentin (NEURONTIN) 300 MG capsule the dosage he is about to call in for pt she has already taken for the day. Please call

## 2018-01-02 NOTE — Telephone Encounter (Signed)
I called the patient, talk with the daughter.  The patient is having increasing pain with neuralgia.  She is on very low-dose Trileptal taking 150 mg twice daily, I would double the dose to 300 mg twice daily, they will call me if the pain continues.

## 2018-01-02 NOTE — Addendum Note (Signed)
Addended by: York Spaniel on: 01/02/2018 04:35 PM   Modules accepted: Orders

## 2018-01-03 DIAGNOSIS — N319 Neuromuscular dysfunction of bladder, unspecified: Secondary | ICD-10-CM | POA: Diagnosis not present

## 2018-01-03 DIAGNOSIS — L89312 Pressure ulcer of right buttock, stage 2: Secondary | ICD-10-CM | POA: Diagnosis not present

## 2018-01-03 DIAGNOSIS — G500 Trigeminal neuralgia: Secondary | ICD-10-CM | POA: Diagnosis not present

## 2018-01-03 DIAGNOSIS — N1330 Unspecified hydronephrosis: Secondary | ICD-10-CM | POA: Diagnosis not present

## 2018-01-03 DIAGNOSIS — R1312 Dysphagia, oropharyngeal phase: Secondary | ICD-10-CM | POA: Diagnosis not present

## 2018-01-03 DIAGNOSIS — Z466 Encounter for fitting and adjustment of urinary device: Secondary | ICD-10-CM | POA: Diagnosis not present

## 2018-01-03 DIAGNOSIS — I10 Essential (primary) hypertension: Secondary | ICD-10-CM | POA: Diagnosis not present

## 2018-01-03 DIAGNOSIS — G35 Multiple sclerosis: Secondary | ICD-10-CM | POA: Diagnosis not present

## 2018-01-03 DIAGNOSIS — I480 Paroxysmal atrial fibrillation: Secondary | ICD-10-CM | POA: Diagnosis not present

## 2018-01-04 DIAGNOSIS — I10 Essential (primary) hypertension: Secondary | ICD-10-CM | POA: Diagnosis not present

## 2018-01-04 DIAGNOSIS — M8589 Other specified disorders of bone density and structure, multiple sites: Secondary | ICD-10-CM | POA: Diagnosis not present

## 2018-01-04 DIAGNOSIS — L89312 Pressure ulcer of right buttock, stage 2: Secondary | ICD-10-CM | POA: Diagnosis not present

## 2018-01-04 DIAGNOSIS — N312 Flaccid neuropathic bladder, not elsewhere classified: Secondary | ICD-10-CM | POA: Diagnosis not present

## 2018-01-04 DIAGNOSIS — E039 Hypothyroidism, unspecified: Secondary | ICD-10-CM | POA: Diagnosis not present

## 2018-01-04 DIAGNOSIS — G35 Multiple sclerosis: Secondary | ICD-10-CM | POA: Diagnosis not present

## 2018-01-04 DIAGNOSIS — N1330 Unspecified hydronephrosis: Secondary | ICD-10-CM | POA: Diagnosis not present

## 2018-01-04 DIAGNOSIS — I4891 Unspecified atrial fibrillation: Secondary | ICD-10-CM | POA: Diagnosis not present

## 2018-01-04 DIAGNOSIS — N319 Neuromuscular dysfunction of bladder, unspecified: Secondary | ICD-10-CM | POA: Diagnosis not present

## 2018-01-04 DIAGNOSIS — G500 Trigeminal neuralgia: Secondary | ICD-10-CM | POA: Diagnosis not present

## 2018-01-04 DIAGNOSIS — I480 Paroxysmal atrial fibrillation: Secondary | ICD-10-CM | POA: Diagnosis not present

## 2018-01-04 DIAGNOSIS — R1312 Dysphagia, oropharyngeal phase: Secondary | ICD-10-CM | POA: Diagnosis not present

## 2018-01-04 DIAGNOSIS — Z466 Encounter for fitting and adjustment of urinary device: Secondary | ICD-10-CM | POA: Diagnosis not present

## 2018-01-05 DIAGNOSIS — I10 Essential (primary) hypertension: Secondary | ICD-10-CM | POA: Diagnosis not present

## 2018-01-05 DIAGNOSIS — G500 Trigeminal neuralgia: Secondary | ICD-10-CM | POA: Diagnosis not present

## 2018-01-05 DIAGNOSIS — L89312 Pressure ulcer of right buttock, stage 2: Secondary | ICD-10-CM | POA: Diagnosis not present

## 2018-01-05 DIAGNOSIS — R1312 Dysphagia, oropharyngeal phase: Secondary | ICD-10-CM | POA: Diagnosis not present

## 2018-01-05 DIAGNOSIS — N319 Neuromuscular dysfunction of bladder, unspecified: Secondary | ICD-10-CM | POA: Diagnosis not present

## 2018-01-05 DIAGNOSIS — G35 Multiple sclerosis: Secondary | ICD-10-CM | POA: Diagnosis not present

## 2018-01-05 DIAGNOSIS — N1330 Unspecified hydronephrosis: Secondary | ICD-10-CM | POA: Diagnosis not present

## 2018-01-05 DIAGNOSIS — Z466 Encounter for fitting and adjustment of urinary device: Secondary | ICD-10-CM | POA: Diagnosis not present

## 2018-01-05 DIAGNOSIS — I480 Paroxysmal atrial fibrillation: Secondary | ICD-10-CM | POA: Diagnosis not present

## 2018-01-10 DIAGNOSIS — I480 Paroxysmal atrial fibrillation: Secondary | ICD-10-CM | POA: Diagnosis not present

## 2018-01-10 DIAGNOSIS — I10 Essential (primary) hypertension: Secondary | ICD-10-CM | POA: Diagnosis not present

## 2018-01-10 DIAGNOSIS — G35 Multiple sclerosis: Secondary | ICD-10-CM | POA: Diagnosis not present

## 2018-01-10 DIAGNOSIS — Z466 Encounter for fitting and adjustment of urinary device: Secondary | ICD-10-CM | POA: Diagnosis not present

## 2018-01-10 DIAGNOSIS — G500 Trigeminal neuralgia: Secondary | ICD-10-CM | POA: Diagnosis not present

## 2018-01-10 DIAGNOSIS — L89312 Pressure ulcer of right buttock, stage 2: Secondary | ICD-10-CM | POA: Diagnosis not present

## 2018-01-10 DIAGNOSIS — R1312 Dysphagia, oropharyngeal phase: Secondary | ICD-10-CM | POA: Diagnosis not present

## 2018-01-10 DIAGNOSIS — N1330 Unspecified hydronephrosis: Secondary | ICD-10-CM | POA: Diagnosis not present

## 2018-01-10 DIAGNOSIS — N319 Neuromuscular dysfunction of bladder, unspecified: Secondary | ICD-10-CM | POA: Diagnosis not present

## 2018-01-11 DIAGNOSIS — N1330 Unspecified hydronephrosis: Secondary | ICD-10-CM | POA: Diagnosis not present

## 2018-01-11 DIAGNOSIS — R1312 Dysphagia, oropharyngeal phase: Secondary | ICD-10-CM | POA: Diagnosis not present

## 2018-01-11 DIAGNOSIS — Z466 Encounter for fitting and adjustment of urinary device: Secondary | ICD-10-CM | POA: Diagnosis not present

## 2018-01-11 DIAGNOSIS — G35 Multiple sclerosis: Secondary | ICD-10-CM | POA: Diagnosis not present

## 2018-01-11 DIAGNOSIS — L89312 Pressure ulcer of right buttock, stage 2: Secondary | ICD-10-CM | POA: Diagnosis not present

## 2018-01-11 DIAGNOSIS — I10 Essential (primary) hypertension: Secondary | ICD-10-CM | POA: Diagnosis not present

## 2018-01-11 DIAGNOSIS — I480 Paroxysmal atrial fibrillation: Secondary | ICD-10-CM | POA: Diagnosis not present

## 2018-01-11 DIAGNOSIS — N319 Neuromuscular dysfunction of bladder, unspecified: Secondary | ICD-10-CM | POA: Diagnosis not present

## 2018-01-11 DIAGNOSIS — G500 Trigeminal neuralgia: Secondary | ICD-10-CM | POA: Diagnosis not present

## 2018-01-16 DIAGNOSIS — R1312 Dysphagia, oropharyngeal phase: Secondary | ICD-10-CM | POA: Diagnosis not present

## 2018-01-16 DIAGNOSIS — N319 Neuromuscular dysfunction of bladder, unspecified: Secondary | ICD-10-CM | POA: Diagnosis not present

## 2018-01-16 DIAGNOSIS — N1330 Unspecified hydronephrosis: Secondary | ICD-10-CM | POA: Diagnosis not present

## 2018-01-16 DIAGNOSIS — G500 Trigeminal neuralgia: Secondary | ICD-10-CM | POA: Diagnosis not present

## 2018-01-16 DIAGNOSIS — I480 Paroxysmal atrial fibrillation: Secondary | ICD-10-CM | POA: Diagnosis not present

## 2018-01-16 DIAGNOSIS — I10 Essential (primary) hypertension: Secondary | ICD-10-CM | POA: Diagnosis not present

## 2018-01-16 DIAGNOSIS — G35 Multiple sclerosis: Secondary | ICD-10-CM | POA: Diagnosis not present

## 2018-01-16 DIAGNOSIS — L89312 Pressure ulcer of right buttock, stage 2: Secondary | ICD-10-CM | POA: Diagnosis not present

## 2018-01-16 DIAGNOSIS — Z466 Encounter for fitting and adjustment of urinary device: Secondary | ICD-10-CM | POA: Diagnosis not present

## 2018-01-18 DIAGNOSIS — Z466 Encounter for fitting and adjustment of urinary device: Secondary | ICD-10-CM | POA: Diagnosis not present

## 2018-01-18 DIAGNOSIS — L89312 Pressure ulcer of right buttock, stage 2: Secondary | ICD-10-CM | POA: Diagnosis not present

## 2018-01-18 DIAGNOSIS — I480 Paroxysmal atrial fibrillation: Secondary | ICD-10-CM | POA: Diagnosis not present

## 2018-01-18 DIAGNOSIS — R531 Weakness: Secondary | ICD-10-CM | POA: Diagnosis not present

## 2018-01-18 DIAGNOSIS — G35 Multiple sclerosis: Secondary | ICD-10-CM | POA: Diagnosis not present

## 2018-01-18 DIAGNOSIS — R1312 Dysphagia, oropharyngeal phase: Secondary | ICD-10-CM | POA: Diagnosis not present

## 2018-01-18 DIAGNOSIS — R269 Unspecified abnormalities of gait and mobility: Secondary | ICD-10-CM | POA: Diagnosis not present

## 2018-01-18 DIAGNOSIS — N319 Neuromuscular dysfunction of bladder, unspecified: Secondary | ICD-10-CM | POA: Diagnosis not present

## 2018-01-18 DIAGNOSIS — M8589 Other specified disorders of bone density and structure, multiple sites: Secondary | ICD-10-CM | POA: Diagnosis not present

## 2018-01-18 DIAGNOSIS — G500 Trigeminal neuralgia: Secondary | ICD-10-CM | POA: Diagnosis not present

## 2018-01-18 DIAGNOSIS — N1330 Unspecified hydronephrosis: Secondary | ICD-10-CM | POA: Diagnosis not present

## 2018-01-18 DIAGNOSIS — E039 Hypothyroidism, unspecified: Secondary | ICD-10-CM | POA: Diagnosis not present

## 2018-01-18 DIAGNOSIS — I10 Essential (primary) hypertension: Secondary | ICD-10-CM | POA: Diagnosis not present

## 2018-01-18 DIAGNOSIS — I4891 Unspecified atrial fibrillation: Secondary | ICD-10-CM | POA: Diagnosis not present

## 2018-01-25 DIAGNOSIS — I10 Essential (primary) hypertension: Secondary | ICD-10-CM | POA: Diagnosis not present

## 2018-01-25 DIAGNOSIS — N1330 Unspecified hydronephrosis: Secondary | ICD-10-CM | POA: Diagnosis not present

## 2018-01-25 DIAGNOSIS — R1312 Dysphagia, oropharyngeal phase: Secondary | ICD-10-CM | POA: Diagnosis not present

## 2018-01-25 DIAGNOSIS — G500 Trigeminal neuralgia: Secondary | ICD-10-CM | POA: Diagnosis not present

## 2018-01-25 DIAGNOSIS — N319 Neuromuscular dysfunction of bladder, unspecified: Secondary | ICD-10-CM | POA: Diagnosis not present

## 2018-01-25 DIAGNOSIS — L89312 Pressure ulcer of right buttock, stage 2: Secondary | ICD-10-CM | POA: Diagnosis not present

## 2018-01-25 DIAGNOSIS — G35 Multiple sclerosis: Secondary | ICD-10-CM | POA: Diagnosis not present

## 2018-01-25 DIAGNOSIS — I480 Paroxysmal atrial fibrillation: Secondary | ICD-10-CM | POA: Diagnosis not present

## 2018-01-25 DIAGNOSIS — Z466 Encounter for fitting and adjustment of urinary device: Secondary | ICD-10-CM | POA: Diagnosis not present

## 2018-01-30 DIAGNOSIS — R1312 Dysphagia, oropharyngeal phase: Secondary | ICD-10-CM | POA: Diagnosis not present

## 2018-01-30 DIAGNOSIS — N1330 Unspecified hydronephrosis: Secondary | ICD-10-CM | POA: Diagnosis not present

## 2018-01-30 DIAGNOSIS — I480 Paroxysmal atrial fibrillation: Secondary | ICD-10-CM | POA: Diagnosis not present

## 2018-01-30 DIAGNOSIS — G35 Multiple sclerosis: Secondary | ICD-10-CM | POA: Diagnosis not present

## 2018-01-30 DIAGNOSIS — Z466 Encounter for fitting and adjustment of urinary device: Secondary | ICD-10-CM | POA: Diagnosis not present

## 2018-01-30 DIAGNOSIS — L89312 Pressure ulcer of right buttock, stage 2: Secondary | ICD-10-CM | POA: Diagnosis not present

## 2018-01-30 DIAGNOSIS — N319 Neuromuscular dysfunction of bladder, unspecified: Secondary | ICD-10-CM | POA: Diagnosis not present

## 2018-01-30 DIAGNOSIS — G500 Trigeminal neuralgia: Secondary | ICD-10-CM | POA: Diagnosis not present

## 2018-01-30 DIAGNOSIS — I10 Essential (primary) hypertension: Secondary | ICD-10-CM | POA: Diagnosis not present

## 2018-01-31 ENCOUNTER — Telehealth: Payer: Self-pay | Admitting: Adult Health

## 2018-01-31 DIAGNOSIS — G35 Multiple sclerosis: Secondary | ICD-10-CM

## 2018-01-31 NOTE — Telephone Encounter (Signed)
Pts daughter Barnet Glasgow) requesting a call stating they would like to move forward with Home Health Aid for the pt. Stating if possible she would like to go through [facility]. Please call to advise

## 2018-02-01 DIAGNOSIS — I480 Paroxysmal atrial fibrillation: Secondary | ICD-10-CM | POA: Diagnosis not present

## 2018-02-01 DIAGNOSIS — Z466 Encounter for fitting and adjustment of urinary device: Secondary | ICD-10-CM | POA: Diagnosis not present

## 2018-02-01 DIAGNOSIS — G35 Multiple sclerosis: Secondary | ICD-10-CM | POA: Diagnosis not present

## 2018-02-01 DIAGNOSIS — N319 Neuromuscular dysfunction of bladder, unspecified: Secondary | ICD-10-CM | POA: Diagnosis not present

## 2018-02-01 DIAGNOSIS — I10 Essential (primary) hypertension: Secondary | ICD-10-CM | POA: Diagnosis not present

## 2018-02-01 DIAGNOSIS — R1312 Dysphagia, oropharyngeal phase: Secondary | ICD-10-CM | POA: Diagnosis not present

## 2018-02-01 DIAGNOSIS — L89312 Pressure ulcer of right buttock, stage 2: Secondary | ICD-10-CM | POA: Diagnosis not present

## 2018-02-01 DIAGNOSIS — N1330 Unspecified hydronephrosis: Secondary | ICD-10-CM | POA: Diagnosis not present

## 2018-02-01 DIAGNOSIS — G500 Trigeminal neuralgia: Secondary | ICD-10-CM | POA: Diagnosis not present

## 2018-02-01 NOTE — Telephone Encounter (Signed)
I spoke to daughter, [name].  She will call [employer] and see what HHA will help.  Pt no longer has Medicaid.  She will call back and LM for me then will process order.

## 2018-02-01 NOTE — Telephone Encounter (Signed)
Do you know what they are wanting an Aide for? I remember discussing in home therapy. Do they need an aide to help with adls?

## 2018-02-02 NOTE — Telephone Encounter (Signed)
Spoke to daughter.  She stated that would like to proceed with HHA this would be for her ADL's at [facility], if not in network will go with [employer] recommendation.  She stated too that her mom is still having issues with her strength and transferring, would like PT as well.

## 2018-02-02 NOTE — Telephone Encounter (Signed)
Pts daughter called stating they would like to move forward with the HHA through [facility]

## 2018-02-03 NOTE — Addendum Note (Signed)
Addended by: Enedina Finner on: 02/03/2018 08:55 AM   Modules accepted: Orders

## 2018-02-03 NOTE — Telephone Encounter (Addendum)
Referral order placed on Jillian Fisher desk (using [facility] for nursing/ aide, and [facility] for PT as daughter did not think [facility] did PT.  Marland Kitchen

## 2018-02-04 DIAGNOSIS — G35 Multiple sclerosis: Secondary | ICD-10-CM | POA: Diagnosis not present

## 2018-02-04 DIAGNOSIS — E039 Hypothyroidism, unspecified: Secondary | ICD-10-CM | POA: Diagnosis not present

## 2018-02-04 DIAGNOSIS — I4891 Unspecified atrial fibrillation: Secondary | ICD-10-CM | POA: Diagnosis not present

## 2018-02-04 DIAGNOSIS — M8589 Other specified disorders of bone density and structure, multiple sites: Secondary | ICD-10-CM | POA: Diagnosis not present

## 2018-02-06 ENCOUNTER — Telehealth: Payer: Self-pay | Admitting: Adult Health

## 2018-02-06 DIAGNOSIS — Z466 Encounter for fitting and adjustment of urinary device: Secondary | ICD-10-CM | POA: Diagnosis not present

## 2018-02-06 DIAGNOSIS — G35 Multiple sclerosis: Secondary | ICD-10-CM | POA: Diagnosis not present

## 2018-02-06 DIAGNOSIS — I10 Essential (primary) hypertension: Secondary | ICD-10-CM | POA: Diagnosis not present

## 2018-02-06 DIAGNOSIS — R1312 Dysphagia, oropharyngeal phase: Secondary | ICD-10-CM | POA: Diagnosis not present

## 2018-02-06 DIAGNOSIS — L89312 Pressure ulcer of right buttock, stage 2: Secondary | ICD-10-CM | POA: Diagnosis not present

## 2018-02-06 DIAGNOSIS — N1330 Unspecified hydronephrosis: Secondary | ICD-10-CM | POA: Diagnosis not present

## 2018-02-06 DIAGNOSIS — G500 Trigeminal neuralgia: Secondary | ICD-10-CM | POA: Diagnosis not present

## 2018-02-06 DIAGNOSIS — I480 Paroxysmal atrial fibrillation: Secondary | ICD-10-CM | POA: Diagnosis not present

## 2018-02-06 DIAGNOSIS — N319 Neuromuscular dysfunction of bladder, unspecified: Secondary | ICD-10-CM | POA: Diagnosis not present

## 2018-02-06 NOTE — Telephone Encounter (Signed)
I have sent to [facility] telephone [phone] Fax [phone] . I have also sent referral to [facility] as well . Daughter will called for scheduling.

## 2018-02-06 NOTE — Telephone Encounter (Signed)
Sent to PT order to [facility] Antony Blackbird.  Sent order for aide to [facility] [phone] - fax [phone] . I have made [facility] and [facility] to please call daughter to schedule. Thanks Annabelle Harman.

## 2018-02-07 NOTE — Telephone Encounter (Signed)
Patient's daughter calling stating that Jillian Fisher does not take [facility].

## 2018-02-08 NOTE — Telephone Encounter (Signed)
Called and left message for Patient's daughter relayed referral had been to [facility] Any way she can inquire with them when they come out out and see her mother mother .

## 2018-02-09 DIAGNOSIS — R3914 Feeling of incomplete bladder emptying: Secondary | ICD-10-CM | POA: Diagnosis not present

## 2018-02-09 DIAGNOSIS — N3941 Urge incontinence: Secondary | ICD-10-CM | POA: Diagnosis not present

## 2018-02-09 DIAGNOSIS — R6889 Other general symptoms and signs: Secondary | ICD-10-CM | POA: Diagnosis not present

## 2018-02-16 DIAGNOSIS — N310 Uninhibited neuropathic bladder, not elsewhere classified: Secondary | ICD-10-CM | POA: Diagnosis not present

## 2018-02-16 DIAGNOSIS — R6889 Other general symptoms and signs: Secondary | ICD-10-CM | POA: Diagnosis not present

## 2018-02-18 DIAGNOSIS — R531 Weakness: Secondary | ICD-10-CM | POA: Diagnosis not present

## 2018-02-18 DIAGNOSIS — R269 Unspecified abnormalities of gait and mobility: Secondary | ICD-10-CM | POA: Diagnosis not present

## 2018-02-18 DIAGNOSIS — I4891 Unspecified atrial fibrillation: Secondary | ICD-10-CM | POA: Diagnosis not present

## 2018-02-18 DIAGNOSIS — M8589 Other specified disorders of bone density and structure, multiple sites: Secondary | ICD-10-CM | POA: Diagnosis not present

## 2018-02-18 DIAGNOSIS — E039 Hypothyroidism, unspecified: Secondary | ICD-10-CM | POA: Diagnosis not present

## 2018-02-18 DIAGNOSIS — G35 Multiple sclerosis: Secondary | ICD-10-CM | POA: Diagnosis not present

## 2018-02-28 DIAGNOSIS — Z466 Encounter for fitting and adjustment of urinary device: Secondary | ICD-10-CM | POA: Diagnosis not present

## 2018-02-28 DIAGNOSIS — G35 Multiple sclerosis: Secondary | ICD-10-CM | POA: Diagnosis not present

## 2018-02-28 DIAGNOSIS — N319 Neuromuscular dysfunction of bladder, unspecified: Secondary | ICD-10-CM | POA: Diagnosis not present

## 2018-02-28 DIAGNOSIS — R1312 Dysphagia, oropharyngeal phase: Secondary | ICD-10-CM | POA: Diagnosis not present

## 2018-02-28 DIAGNOSIS — L89312 Pressure ulcer of right buttock, stage 2: Secondary | ICD-10-CM | POA: Diagnosis not present

## 2018-02-28 DIAGNOSIS — I10 Essential (primary) hypertension: Secondary | ICD-10-CM | POA: Diagnosis not present

## 2018-02-28 DIAGNOSIS — I480 Paroxysmal atrial fibrillation: Secondary | ICD-10-CM | POA: Diagnosis not present

## 2018-02-28 DIAGNOSIS — M1991 Primary osteoarthritis, unspecified site: Secondary | ICD-10-CM | POA: Diagnosis not present

## 2018-02-28 DIAGNOSIS — N1330 Unspecified hydronephrosis: Secondary | ICD-10-CM | POA: Diagnosis not present

## 2018-03-01 DIAGNOSIS — N1330 Unspecified hydronephrosis: Secondary | ICD-10-CM | POA: Diagnosis not present

## 2018-03-01 DIAGNOSIS — G35 Multiple sclerosis: Secondary | ICD-10-CM | POA: Diagnosis not present

## 2018-03-01 DIAGNOSIS — L89312 Pressure ulcer of right buttock, stage 2: Secondary | ICD-10-CM | POA: Diagnosis not present

## 2018-03-01 DIAGNOSIS — M1991 Primary osteoarthritis, unspecified site: Secondary | ICD-10-CM | POA: Diagnosis not present

## 2018-03-01 DIAGNOSIS — R1312 Dysphagia, oropharyngeal phase: Secondary | ICD-10-CM | POA: Diagnosis not present

## 2018-03-01 DIAGNOSIS — Z466 Encounter for fitting and adjustment of urinary device: Secondary | ICD-10-CM | POA: Diagnosis not present

## 2018-03-01 DIAGNOSIS — I10 Essential (primary) hypertension: Secondary | ICD-10-CM | POA: Diagnosis not present

## 2018-03-01 DIAGNOSIS — I480 Paroxysmal atrial fibrillation: Secondary | ICD-10-CM | POA: Diagnosis not present

## 2018-03-01 DIAGNOSIS — N319 Neuromuscular dysfunction of bladder, unspecified: Secondary | ICD-10-CM | POA: Diagnosis not present

## 2018-03-06 DIAGNOSIS — I4891 Unspecified atrial fibrillation: Secondary | ICD-10-CM | POA: Diagnosis not present

## 2018-03-06 DIAGNOSIS — E039 Hypothyroidism, unspecified: Secondary | ICD-10-CM | POA: Diagnosis not present

## 2018-03-06 DIAGNOSIS — G35 Multiple sclerosis: Secondary | ICD-10-CM | POA: Diagnosis not present

## 2018-03-06 DIAGNOSIS — M8589 Other specified disorders of bone density and structure, multiple sites: Secondary | ICD-10-CM | POA: Diagnosis not present

## 2018-03-07 DIAGNOSIS — Z466 Encounter for fitting and adjustment of urinary device: Secondary | ICD-10-CM | POA: Diagnosis not present

## 2018-03-07 DIAGNOSIS — N1330 Unspecified hydronephrosis: Secondary | ICD-10-CM | POA: Diagnosis not present

## 2018-03-07 DIAGNOSIS — I480 Paroxysmal atrial fibrillation: Secondary | ICD-10-CM | POA: Diagnosis not present

## 2018-03-07 DIAGNOSIS — M1991 Primary osteoarthritis, unspecified site: Secondary | ICD-10-CM | POA: Diagnosis not present

## 2018-03-07 DIAGNOSIS — R1312 Dysphagia, oropharyngeal phase: Secondary | ICD-10-CM | POA: Diagnosis not present

## 2018-03-07 DIAGNOSIS — N319 Neuromuscular dysfunction of bladder, unspecified: Secondary | ICD-10-CM | POA: Diagnosis not present

## 2018-03-07 DIAGNOSIS — L89312 Pressure ulcer of right buttock, stage 2: Secondary | ICD-10-CM | POA: Diagnosis not present

## 2018-03-07 DIAGNOSIS — G35 Multiple sclerosis: Secondary | ICD-10-CM | POA: Diagnosis not present

## 2018-03-07 DIAGNOSIS — I10 Essential (primary) hypertension: Secondary | ICD-10-CM | POA: Diagnosis not present

## 2018-03-08 DIAGNOSIS — I10 Essential (primary) hypertension: Secondary | ICD-10-CM | POA: Diagnosis not present

## 2018-03-08 DIAGNOSIS — L89312 Pressure ulcer of right buttock, stage 2: Secondary | ICD-10-CM | POA: Diagnosis not present

## 2018-03-08 DIAGNOSIS — N1330 Unspecified hydronephrosis: Secondary | ICD-10-CM | POA: Diagnosis not present

## 2018-03-08 DIAGNOSIS — G35 Multiple sclerosis: Secondary | ICD-10-CM | POA: Diagnosis not present

## 2018-03-08 DIAGNOSIS — M1991 Primary osteoarthritis, unspecified site: Secondary | ICD-10-CM | POA: Diagnosis not present

## 2018-03-08 DIAGNOSIS — N319 Neuromuscular dysfunction of bladder, unspecified: Secondary | ICD-10-CM | POA: Diagnosis not present

## 2018-03-08 DIAGNOSIS — R1312 Dysphagia, oropharyngeal phase: Secondary | ICD-10-CM | POA: Diagnosis not present

## 2018-03-08 DIAGNOSIS — Z466 Encounter for fitting and adjustment of urinary device: Secondary | ICD-10-CM | POA: Diagnosis not present

## 2018-03-08 DIAGNOSIS — I480 Paroxysmal atrial fibrillation: Secondary | ICD-10-CM | POA: Diagnosis not present

## 2018-03-14 ENCOUNTER — Telehealth: Payer: Self-pay | Admitting: Adult Health

## 2018-03-14 DIAGNOSIS — G35 Multiple sclerosis: Secondary | ICD-10-CM | POA: Diagnosis not present

## 2018-03-14 DIAGNOSIS — M1991 Primary osteoarthritis, unspecified site: Secondary | ICD-10-CM | POA: Diagnosis not present

## 2018-03-14 DIAGNOSIS — N1330 Unspecified hydronephrosis: Secondary | ICD-10-CM | POA: Diagnosis not present

## 2018-03-14 DIAGNOSIS — L89312 Pressure ulcer of right buttock, stage 2: Secondary | ICD-10-CM | POA: Diagnosis not present

## 2018-03-14 DIAGNOSIS — I480 Paroxysmal atrial fibrillation: Secondary | ICD-10-CM | POA: Diagnosis not present

## 2018-03-14 DIAGNOSIS — N319 Neuromuscular dysfunction of bladder, unspecified: Secondary | ICD-10-CM | POA: Diagnosis not present

## 2018-03-14 DIAGNOSIS — I10 Essential (primary) hypertension: Secondary | ICD-10-CM | POA: Diagnosis not present

## 2018-03-14 DIAGNOSIS — Z466 Encounter for fitting and adjustment of urinary device: Secondary | ICD-10-CM | POA: Diagnosis not present

## 2018-03-14 DIAGNOSIS — R1312 Dysphagia, oropharyngeal phase: Secondary | ICD-10-CM | POA: Diagnosis not present

## 2018-03-14 NOTE — Telephone Encounter (Signed)
Pts daughter [name] (on dpr) called stating that the pts TN pain throughout her right jaw started back yesterday. Requesting a call to discuss something to help.

## 2018-03-15 ENCOUNTER — Other Ambulatory Visit: Payer: Self-pay | Admitting: Neurology

## 2018-03-15 DIAGNOSIS — M1991 Primary osteoarthritis, unspecified site: Secondary | ICD-10-CM | POA: Diagnosis not present

## 2018-03-15 DIAGNOSIS — I10 Essential (primary) hypertension: Secondary | ICD-10-CM | POA: Diagnosis not present

## 2018-03-15 DIAGNOSIS — N310 Uninhibited neuropathic bladder, not elsewhere classified: Secondary | ICD-10-CM | POA: Diagnosis not present

## 2018-03-15 DIAGNOSIS — I480 Paroxysmal atrial fibrillation: Secondary | ICD-10-CM | POA: Diagnosis not present

## 2018-03-15 DIAGNOSIS — G35 Multiple sclerosis: Secondary | ICD-10-CM | POA: Diagnosis not present

## 2018-03-15 DIAGNOSIS — R3914 Feeling of incomplete bladder emptying: Secondary | ICD-10-CM | POA: Diagnosis not present

## 2018-03-15 DIAGNOSIS — Z466 Encounter for fitting and adjustment of urinary device: Secondary | ICD-10-CM | POA: Diagnosis not present

## 2018-03-15 DIAGNOSIS — N1330 Unspecified hydronephrosis: Secondary | ICD-10-CM | POA: Diagnosis not present

## 2018-03-15 DIAGNOSIS — R6889 Other general symptoms and signs: Secondary | ICD-10-CM | POA: Diagnosis not present

## 2018-03-15 DIAGNOSIS — R1312 Dysphagia, oropharyngeal phase: Secondary | ICD-10-CM | POA: Diagnosis not present

## 2018-03-15 DIAGNOSIS — L89312 Pressure ulcer of right buttock, stage 2: Secondary | ICD-10-CM | POA: Diagnosis not present

## 2018-03-15 DIAGNOSIS — N319 Neuromuscular dysfunction of bladder, unspecified: Secondary | ICD-10-CM | POA: Diagnosis not present

## 2018-03-15 NOTE — Telephone Encounter (Signed)
I called and spoke to pt.   She stated TN pain started last week and is progressively getting worse, cold, touch makes things worse.  She is taking trileptal 300mg  po bid, and gabapentin 300mg  po qid on regular basis and this is not helping. She wanted me to call her daughter, [phone] which I did, LMVM for her.

## 2018-03-16 NOTE — Telephone Encounter (Signed)
I called the patient's daughter.  She is still having pain.  She states that is better after she takes her morning medication but then gets worse later in the afternoon.  She is only taking gabapentin 3 times a day versus 4 times a day.  Advised to increase the gabapentin back to 4 times a day to see if this offers any benefit.  She will continue on Trileptal twice a day

## 2018-03-20 DIAGNOSIS — M8589 Other specified disorders of bone density and structure, multiple sites: Secondary | ICD-10-CM | POA: Diagnosis not present

## 2018-03-20 DIAGNOSIS — R269 Unspecified abnormalities of gait and mobility: Secondary | ICD-10-CM | POA: Diagnosis not present

## 2018-03-20 DIAGNOSIS — I4891 Unspecified atrial fibrillation: Secondary | ICD-10-CM | POA: Diagnosis not present

## 2018-03-20 DIAGNOSIS — E039 Hypothyroidism, unspecified: Secondary | ICD-10-CM | POA: Diagnosis not present

## 2018-03-20 DIAGNOSIS — G35 Multiple sclerosis: Secondary | ICD-10-CM | POA: Diagnosis not present

## 2018-03-20 DIAGNOSIS — R531 Weakness: Secondary | ICD-10-CM | POA: Diagnosis not present

## 2018-03-22 DIAGNOSIS — I480 Paroxysmal atrial fibrillation: Secondary | ICD-10-CM | POA: Diagnosis not present

## 2018-03-22 DIAGNOSIS — R1312 Dysphagia, oropharyngeal phase: Secondary | ICD-10-CM | POA: Diagnosis not present

## 2018-03-22 DIAGNOSIS — I10 Essential (primary) hypertension: Secondary | ICD-10-CM | POA: Diagnosis not present

## 2018-03-22 DIAGNOSIS — N319 Neuromuscular dysfunction of bladder, unspecified: Secondary | ICD-10-CM | POA: Diagnosis not present

## 2018-03-22 DIAGNOSIS — M1991 Primary osteoarthritis, unspecified site: Secondary | ICD-10-CM | POA: Diagnosis not present

## 2018-03-22 DIAGNOSIS — N1330 Unspecified hydronephrosis: Secondary | ICD-10-CM | POA: Diagnosis not present

## 2018-03-22 DIAGNOSIS — G35 Multiple sclerosis: Secondary | ICD-10-CM | POA: Diagnosis not present

## 2018-03-22 DIAGNOSIS — Z466 Encounter for fitting and adjustment of urinary device: Secondary | ICD-10-CM | POA: Diagnosis not present

## 2018-03-22 DIAGNOSIS — L89312 Pressure ulcer of right buttock, stage 2: Secondary | ICD-10-CM | POA: Diagnosis not present

## 2018-03-27 DIAGNOSIS — I10 Essential (primary) hypertension: Secondary | ICD-10-CM | POA: Diagnosis not present

## 2018-03-27 DIAGNOSIS — M1991 Primary osteoarthritis, unspecified site: Secondary | ICD-10-CM | POA: Diagnosis not present

## 2018-03-27 DIAGNOSIS — R1312 Dysphagia, oropharyngeal phase: Secondary | ICD-10-CM | POA: Diagnosis not present

## 2018-03-27 DIAGNOSIS — I480 Paroxysmal atrial fibrillation: Secondary | ICD-10-CM | POA: Diagnosis not present

## 2018-03-27 DIAGNOSIS — Z466 Encounter for fitting and adjustment of urinary device: Secondary | ICD-10-CM | POA: Diagnosis not present

## 2018-03-27 DIAGNOSIS — G35 Multiple sclerosis: Secondary | ICD-10-CM | POA: Diagnosis not present

## 2018-03-27 DIAGNOSIS — N319 Neuromuscular dysfunction of bladder, unspecified: Secondary | ICD-10-CM | POA: Diagnosis not present

## 2018-03-27 DIAGNOSIS — L89312 Pressure ulcer of right buttock, stage 2: Secondary | ICD-10-CM | POA: Diagnosis not present

## 2018-03-27 DIAGNOSIS — N1330 Unspecified hydronephrosis: Secondary | ICD-10-CM | POA: Diagnosis not present

## 2018-03-30 DIAGNOSIS — Z466 Encounter for fitting and adjustment of urinary device: Secondary | ICD-10-CM | POA: Diagnosis not present

## 2018-03-30 DIAGNOSIS — I10 Essential (primary) hypertension: Secondary | ICD-10-CM | POA: Diagnosis not present

## 2018-03-30 DIAGNOSIS — R1312 Dysphagia, oropharyngeal phase: Secondary | ICD-10-CM | POA: Diagnosis not present

## 2018-03-30 DIAGNOSIS — I480 Paroxysmal atrial fibrillation: Secondary | ICD-10-CM | POA: Diagnosis not present

## 2018-03-30 DIAGNOSIS — N1330 Unspecified hydronephrosis: Secondary | ICD-10-CM | POA: Diagnosis not present

## 2018-03-30 DIAGNOSIS — G35 Multiple sclerosis: Secondary | ICD-10-CM | POA: Diagnosis not present

## 2018-03-30 DIAGNOSIS — M1991 Primary osteoarthritis, unspecified site: Secondary | ICD-10-CM | POA: Diagnosis not present

## 2018-03-30 DIAGNOSIS — L89312 Pressure ulcer of right buttock, stage 2: Secondary | ICD-10-CM | POA: Diagnosis not present

## 2018-03-30 DIAGNOSIS — N319 Neuromuscular dysfunction of bladder, unspecified: Secondary | ICD-10-CM | POA: Diagnosis not present

## 2018-04-04 DIAGNOSIS — R1312 Dysphagia, oropharyngeal phase: Secondary | ICD-10-CM | POA: Diagnosis not present

## 2018-04-04 DIAGNOSIS — M1991 Primary osteoarthritis, unspecified site: Secondary | ICD-10-CM | POA: Diagnosis not present

## 2018-04-04 DIAGNOSIS — I10 Essential (primary) hypertension: Secondary | ICD-10-CM | POA: Diagnosis not present

## 2018-04-04 DIAGNOSIS — N1330 Unspecified hydronephrosis: Secondary | ICD-10-CM | POA: Diagnosis not present

## 2018-04-04 DIAGNOSIS — N319 Neuromuscular dysfunction of bladder, unspecified: Secondary | ICD-10-CM | POA: Diagnosis not present

## 2018-04-04 DIAGNOSIS — G35 Multiple sclerosis: Secondary | ICD-10-CM | POA: Diagnosis not present

## 2018-04-04 DIAGNOSIS — L89312 Pressure ulcer of right buttock, stage 2: Secondary | ICD-10-CM | POA: Diagnosis not present

## 2018-04-04 DIAGNOSIS — I480 Paroxysmal atrial fibrillation: Secondary | ICD-10-CM | POA: Diagnosis not present

## 2018-04-04 DIAGNOSIS — Z466 Encounter for fitting and adjustment of urinary device: Secondary | ICD-10-CM | POA: Diagnosis not present

## 2018-04-06 DIAGNOSIS — M8589 Other specified disorders of bone density and structure, multiple sites: Secondary | ICD-10-CM | POA: Diagnosis not present

## 2018-04-06 DIAGNOSIS — I4891 Unspecified atrial fibrillation: Secondary | ICD-10-CM | POA: Diagnosis not present

## 2018-04-06 DIAGNOSIS — E039 Hypothyroidism, unspecified: Secondary | ICD-10-CM | POA: Diagnosis not present

## 2018-04-06 DIAGNOSIS — G35 Multiple sclerosis: Secondary | ICD-10-CM | POA: Diagnosis not present

## 2018-04-20 DIAGNOSIS — M8589 Other specified disorders of bone density and structure, multiple sites: Secondary | ICD-10-CM | POA: Diagnosis not present

## 2018-04-20 DIAGNOSIS — R531 Weakness: Secondary | ICD-10-CM | POA: Diagnosis not present

## 2018-04-20 DIAGNOSIS — G35 Multiple sclerosis: Secondary | ICD-10-CM | POA: Diagnosis not present

## 2018-04-20 DIAGNOSIS — I4891 Unspecified atrial fibrillation: Secondary | ICD-10-CM | POA: Diagnosis not present

## 2018-04-20 DIAGNOSIS — R269 Unspecified abnormalities of gait and mobility: Secondary | ICD-10-CM | POA: Diagnosis not present

## 2018-04-20 DIAGNOSIS — E039 Hypothyroidism, unspecified: Secondary | ICD-10-CM | POA: Diagnosis not present

## 2018-04-27 ENCOUNTER — Ambulatory Visit (INDEPENDENT_AMBULATORY_CARE_PROVIDER_SITE_OTHER): Payer: Medicare HMO | Admitting: Neurology

## 2018-04-27 ENCOUNTER — Encounter: Payer: Self-pay | Admitting: Neurology

## 2018-04-27 VITALS — BP 140/74 | HR 83

## 2018-04-27 DIAGNOSIS — Z5181 Encounter for therapeutic drug level monitoring: Secondary | ICD-10-CM | POA: Diagnosis not present

## 2018-04-27 DIAGNOSIS — R269 Unspecified abnormalities of gait and mobility: Secondary | ICD-10-CM | POA: Diagnosis not present

## 2018-04-27 DIAGNOSIS — G500 Trigeminal neuralgia: Secondary | ICD-10-CM | POA: Diagnosis not present

## 2018-04-27 DIAGNOSIS — N319 Neuromuscular dysfunction of bladder, unspecified: Secondary | ICD-10-CM | POA: Diagnosis not present

## 2018-04-27 DIAGNOSIS — G35 Multiple sclerosis: Secondary | ICD-10-CM

## 2018-04-27 DIAGNOSIS — R6889 Other general symptoms and signs: Secondary | ICD-10-CM | POA: Diagnosis not present

## 2018-04-27 NOTE — Progress Notes (Signed)
Reason for visit: Multiple sclerosis  Jillian Fisher is an 73 y.o. female  History of present illness:  Jillian Fisher is a 73 year old right-handed black female with a history of multiple sclerosis with brain and spinal cord lesions.  The patient has not done well since last seen.  She has been placed on Tecfidera in the summer 2019, she had a severe MS attack in April 2019.  She has had significant weakness of the right leg, she is no longer able to stand or ambulate.  The patient lives at home with her husband, her daughter comes over to help frequently.  The patient has not had any falls, her daughter basically has to lift her out of her hospital bed at home to a chair or to the commode.  The patient may have had some gradual changes in her clinical condition since the summer, she did undergo some physical therapy in the home environment.  She currently is on Tecfidera which was started in June 2019.  She is tolerating the medication well.  She has trigeminal neuralgia and her pain is increased and December 2019, she has gone up on the gabapentin taking 4 of the 300 mg capsules daily which seem to help, she is also on Trileptal taking 300 mg twice daily.  She returns for an evaluation.  She does have a history of neurogenic bladder, she is followed through urology.  Past Medical History:  Diagnosis Date  . A-fib (HCC)   . Abnormality of gait 01/25/2013  . Arthritis   . GERD (gastroesophageal reflux disease)   . Hypercholesteremia   . Hypertension   . Hypothyroidism   . Multiple sclerosis (HCC)   . Neurogenic bladder 01/25/2013  . Trigeminal neuralgia 07/07/2017   Right V3    Past Surgical History:  Procedure Laterality Date  . ABDOMINAL HYSTERECTOMY      Family History  Problem Relation Age of Onset  . Heart attack Mother   . Multiple sclerosis Sister   . Heart disease Sister   . Diabetes Other     Social history:  reports that she has never smoked. She has never used  smokeless tobacco. She reports that she does not drink alcohol or use drugs.    Allergies  Allergen Reactions  . Sulfa Antibiotics Rash    Medications:  Prior to Admission medications   Medication Sig Start Date End Date Taking? Authorizing Provider  ALPRAZolam (XANAX) 0.25 MG tablet Take 1 tablet (0.25 mg total) by mouth daily as needed for anxiety. 09/07/17   Jillian Merles Latif, DO  apixaban (ELIQUIS) 5 MG TABS tablet Take 1 tablet (5 mg total) by mouth 2 (two) times daily. 02/02/16 10/25/17  Fisher, Jillian Repress, Fisher  diclofenac sodium (VOLTAREN) 1 % GEL Apply 2 g topically 4 (four) times daily. 09/07/17   Jillian Merles Latif, DO  diltiazem (CARDIZEM CD) 240 MG 24 hr capsule Take 1 capsule (240 mg total) by mouth daily. 02/02/16   Fisher, Jillian Repress, Fisher  feeding supplement, ENSURE ENLIVE, (ENSURE ENLIVE) LIQD Take 237 mLs by mouth 2 (two) times daily between meals. 09/07/17   Jillian Merles Latif, DO  gabapentin (NEURONTIN) 300 MG capsule Take 1 capsule (300 mg total) by mouth 4 (four) times daily. 07/07/17   Jillian Fisher  levothyroxine (SYNTHROID, LEVOTHROID) 125 MCG tablet Take 125 mcg by mouth daily before breakfast.  12/11/14   Provider, Historical, Fisher  metoprolol tartrate (LOPRESSOR) 50 MG tablet Take 1 tablet (50 mg  total) by mouth 2 (two) times daily. 09/07/17   Jillian MerlesSheikh, Jillian Latif, DO  Multiple Vitamin (MULTIVITAMIN WITH MINERALS) TABS Take 1 tablet by mouth daily.    Provider, Historical, Fisher  omeprazole (PRILOSEC) 20 MG capsule Take 20 mg by mouth daily as needed (reflux).     Provider, Historical, Fisher  Oxcarbazepine (TRILEPTAL) 300 MG tablet TAKE 1 TABLET (300 MG TOTAL) BY MOUTH 2 (TWO) TIMES DAILY. 03/15/18   Jillian SpanielWillis, Jillian Fisher, Fisher  rosuvastatin (CRESTOR) 20 MG tablet Take 10 mg by mouth daily.     Provider, Historical, Fisher  TECFIDERA 240 MG CPDR 240 mg 2 (two) times daily. 10/14/17   Provider, Historical, Fisher    ROS:  Out of a complete 14 system review of symptoms, the patient complains only  of the following symptoms, and all other reviewed systems are negative.  Numbness of the hand Walking problem  Blood pressure 140/74, pulse 83.  Physical Exam  General: The patient is alert and cooperative at the time of the examination.  Skin: No significant peripheral edema is noted.   Neurologic Exam  Mental status: The patient is alert and oriented x 3 at the time of the examination. The patient has apparent normal recent and remote memory, with an apparently normal attention span and concentration ability.   Cranial nerves: Facial symmetry is present. Speech is normal, no aphasia or dysarthria is noted. Extraocular movements are full. Visual fields are full.  Pupils are equal, round, and reactive to light.  Discs are flat bilaterally.  Motor: The patient has good strength in the upper extremities.  The patient has diffuse 2-3/5 strength in the right lower extremity, the patient has 4/5 strength with hip flexion on the left, fairly good strength with knee flexion extension on the left.  The patient has bilateral foot drops.  Sensory examination: Soft touch sensation is symmetric on the face, arms, and legs.  Coordination: The patient has good finger-nose-finger bilaterally.  The patient is not able perform heel-to-shin with the right leg, she has some difficulty performing it with the left.  Gait and station: The patient is not able to stand.  Reflexes: Deep tendon reflexes are symmetric, but are somewhat brisk in the arms.   Assessment/Plan:  1.  Multiple sclerosis  2.  Gait disturbance  3.  Neurogenic bladder   4.  Trigeminal neuralgia  The patient is not able to ambulate independently or with assistance currently.  If the patient continues to gradually slowly worsen over time, a switch to Ocrevus should be considered.  The patient will undergo blood work today, if needed, we may be able to go up on the Trileptal dosing for the trigeminal neuralgia.  The patient will  follow-up in 6 months.  A prescription was given for a Hoyer lift for home use.  Jillian Palau. Keith Willis Fisher 04/27/2018 9:46 AM  [facility] [address] [place], [place] [id number]  Phone [phone] Fax [phone]

## 2018-04-29 DIAGNOSIS — I4891 Unspecified atrial fibrillation: Secondary | ICD-10-CM | POA: Diagnosis not present

## 2018-04-29 DIAGNOSIS — E039 Hypothyroidism, unspecified: Secondary | ICD-10-CM | POA: Diagnosis not present

## 2018-04-29 DIAGNOSIS — G35 Multiple sclerosis: Secondary | ICD-10-CM | POA: Diagnosis not present

## 2018-04-29 DIAGNOSIS — M8589 Other specified disorders of bone density and structure, multiple sites: Secondary | ICD-10-CM | POA: Diagnosis not present

## 2018-04-29 DIAGNOSIS — R269 Unspecified abnormalities of gait and mobility: Secondary | ICD-10-CM | POA: Diagnosis not present

## 2018-04-29 DIAGNOSIS — R531 Weakness: Secondary | ICD-10-CM | POA: Diagnosis not present

## 2018-04-29 LAB — CBC WITH DIFFERENTIAL/PLATELET
BASOS: 1 %
Basophils Absolute: 0.1 10*3/uL (ref 0.0–0.2)
EOS (ABSOLUTE): 0.1 10*3/uL (ref 0.0–0.4)
EOS: 2 %
HEMATOCRIT: 34.4 % (ref 34.0–46.6)
HEMOGLOBIN: 10.8 g/dL — AB (ref 11.1–15.9)
Immature Grans (Abs): 0 10*3/uL (ref 0.0–0.1)
Immature Granulocytes: 0 %
LYMPHS ABS: 1 10*3/uL (ref 0.7–3.1)
Lymphs: 19 %
MCH: 27.3 pg (ref 26.6–33.0)
MCHC: 31.4 g/dL — AB (ref 31.5–35.7)
MCV: 87 fL (ref 79–97)
MONOS ABS: 0.3 10*3/uL (ref 0.1–0.9)
Monocytes: 6 %
NEUTROS ABS: 3.9 10*3/uL (ref 1.4–7.0)
Neutrophils: 72 %
Platelets: 604 10*3/uL — ABNORMAL HIGH (ref 150–450)
RBC: 3.96 x10E6/uL (ref 3.77–5.28)
RDW: 13.4 % (ref 11.7–15.4)
WBC: 5.5 10*3/uL (ref 3.4–10.8)

## 2018-04-29 LAB — COMPREHENSIVE METABOLIC PANEL
ALBUMIN: 3 g/dL — AB (ref 3.7–4.7)
ALK PHOS: 68 IU/L (ref 39–117)
ALT: 9 IU/L (ref 0–32)
AST: 14 IU/L (ref 0–40)
Albumin/Globulin Ratio: 0.8 — ABNORMAL LOW (ref 1.2–2.2)
BUN/Creatinine Ratio: 16 (ref 12–28)
BUN: 9 mg/dL (ref 8–27)
CO2: 21 mmol/L (ref 20–29)
CREATININE: 0.58 mg/dL (ref 0.57–1.00)
Calcium: 9.7 mg/dL (ref 8.7–10.3)
Chloride: 103 mmol/L (ref 96–106)
GFR calc non Af Amer: 93 mL/min/{1.73_m2} (ref >59)
GFR, EST AFRICAN AMERICAN: 107 mL/min/{1.73_m2} (ref >59)
GLOBULIN, TOTAL: 3.6 g/dL (ref 1.5–4.5)
GLUCOSE: 140 mg/dL — AB (ref 65–99)
Potassium: 4.1 mmol/L (ref 3.5–5.2)
SODIUM: 142 mmol/L (ref 134–144)
Total Protein: 6.6 g/dL (ref 6.0–8.5)

## 2018-04-29 LAB — 10-HYDROXYCARBAZEPINE: Oxcarbazepine SerPl-Mcnc: 15 ug/mL (ref 10–35)

## 2018-05-02 ENCOUNTER — Telehealth: Payer: Self-pay

## 2018-05-02 NOTE — Telephone Encounter (Signed)
-----   Message from York Spaniel, MD sent at 04/30/2018  4:11 PM EST ----- Blood work shows a low albumin level that is improving, may be related to malnutrition.  There is a chronic stable anemia.  Trileptal level is in the therapeutic range, can go up on the dose if the neuralgia pain worsens in the future. Please call the patient.Marland Kitchen ----- Message ----- From: Nell Range Lab Results In Sent: 04/28/2018   7:38 AM EST To: York Spaniel, MD

## 2018-05-02 NOTE — Telephone Encounter (Signed)
I contacted the Jillian Fisher and reviewed lab results from Dr. Anne Hahn. Jillian Fisher verbalized understanding but requested I contact her daughter Jillian Fisher (ok per dpr) and discuss results as well. I contacted Jillian Fisher and reviewed labs and she voiced understanding. She had no further questions/concerns at this time.

## 2018-05-07 DIAGNOSIS — M8589 Other specified disorders of bone density and structure, multiple sites: Secondary | ICD-10-CM | POA: Diagnosis not present

## 2018-05-07 DIAGNOSIS — G35 Multiple sclerosis: Secondary | ICD-10-CM | POA: Diagnosis not present

## 2018-05-07 DIAGNOSIS — E039 Hypothyroidism, unspecified: Secondary | ICD-10-CM | POA: Diagnosis not present

## 2018-05-07 DIAGNOSIS — I4891 Unspecified atrial fibrillation: Secondary | ICD-10-CM | POA: Diagnosis not present

## 2018-05-21 DIAGNOSIS — R269 Unspecified abnormalities of gait and mobility: Secondary | ICD-10-CM | POA: Diagnosis not present

## 2018-05-21 DIAGNOSIS — M8589 Other specified disorders of bone density and structure, multiple sites: Secondary | ICD-10-CM | POA: Diagnosis not present

## 2018-05-21 DIAGNOSIS — G35 Multiple sclerosis: Secondary | ICD-10-CM | POA: Diagnosis not present

## 2018-05-21 DIAGNOSIS — E039 Hypothyroidism, unspecified: Secondary | ICD-10-CM | POA: Diagnosis not present

## 2018-05-21 DIAGNOSIS — I4891 Unspecified atrial fibrillation: Secondary | ICD-10-CM | POA: Diagnosis not present

## 2018-05-21 DIAGNOSIS — R531 Weakness: Secondary | ICD-10-CM | POA: Diagnosis not present

## 2018-06-02 ENCOUNTER — Other Ambulatory Visit: Payer: Self-pay | Admitting: Cardiology

## 2018-06-04 ENCOUNTER — Other Ambulatory Visit: Payer: Self-pay | Admitting: Adult Health

## 2018-06-05 DIAGNOSIS — E039 Hypothyroidism, unspecified: Secondary | ICD-10-CM | POA: Diagnosis not present

## 2018-06-05 DIAGNOSIS — N319 Neuromuscular dysfunction of bladder, unspecified: Secondary | ICD-10-CM | POA: Diagnosis not present

## 2018-06-05 DIAGNOSIS — I4891 Unspecified atrial fibrillation: Secondary | ICD-10-CM | POA: Diagnosis not present

## 2018-06-05 DIAGNOSIS — Z5181 Encounter for therapeutic drug level monitoring: Secondary | ICD-10-CM | POA: Diagnosis not present

## 2018-06-05 DIAGNOSIS — M8589 Other specified disorders of bone density and structure, multiple sites: Secondary | ICD-10-CM | POA: Diagnosis not present

## 2018-06-05 DIAGNOSIS — G35 Multiple sclerosis: Secondary | ICD-10-CM | POA: Diagnosis not present

## 2018-06-05 DIAGNOSIS — G500 Trigeminal neuralgia: Secondary | ICD-10-CM | POA: Diagnosis not present

## 2018-06-05 DIAGNOSIS — R269 Unspecified abnormalities of gait and mobility: Secondary | ICD-10-CM | POA: Diagnosis not present

## 2018-06-07 ENCOUNTER — Ambulatory Visit: Payer: Self-pay | Admitting: Cardiology

## 2018-06-13 DIAGNOSIS — R6889 Other general symptoms and signs: Secondary | ICD-10-CM | POA: Diagnosis not present

## 2018-06-13 DIAGNOSIS — N310 Uninhibited neuropathic bladder, not elsewhere classified: Secondary | ICD-10-CM | POA: Diagnosis not present

## 2018-06-19 ENCOUNTER — Telehealth: Payer: Self-pay

## 2018-06-19 ENCOUNTER — Other Ambulatory Visit: Payer: Self-pay

## 2018-06-19 DIAGNOSIS — G500 Trigeminal neuralgia: Secondary | ICD-10-CM | POA: Diagnosis not present

## 2018-06-19 DIAGNOSIS — Z5181 Encounter for therapeutic drug level monitoring: Secondary | ICD-10-CM | POA: Diagnosis not present

## 2018-06-19 DIAGNOSIS — R269 Unspecified abnormalities of gait and mobility: Secondary | ICD-10-CM | POA: Diagnosis not present

## 2018-06-19 DIAGNOSIS — G35 Multiple sclerosis: Secondary | ICD-10-CM | POA: Diagnosis not present

## 2018-06-19 DIAGNOSIS — N319 Neuromuscular dysfunction of bladder, unspecified: Secondary | ICD-10-CM | POA: Diagnosis not present

## 2018-06-19 DIAGNOSIS — E039 Hypothyroidism, unspecified: Secondary | ICD-10-CM | POA: Diagnosis not present

## 2018-06-19 DIAGNOSIS — R531 Weakness: Secondary | ICD-10-CM | POA: Diagnosis not present

## 2018-06-19 MED ORDER — APIXABAN 5 MG PO TABS: 5.0000 mg | ORAL_TABLET | Freq: Two times a day (BID) | ORAL | 2 refills | Status: DC

## 2018-06-19 NOTE — Telephone Encounter (Signed)
Received a call from the patients daughter Jillian Fisher wanting to know if there was any type of assistance programs that would help pay for Tecfidera since her out of pocket cost will be so high. Please advise.

## 2018-06-19 NOTE — Telephone Encounter (Signed)
Called back. Recommended she contact [employer] at [phone] to see if they can help with this. She will call back if anything further needed.

## 2018-06-28 DIAGNOSIS — R269 Unspecified abnormalities of gait and mobility: Secondary | ICD-10-CM | POA: Diagnosis not present

## 2018-06-28 DIAGNOSIS — N319 Neuromuscular dysfunction of bladder, unspecified: Secondary | ICD-10-CM | POA: Diagnosis not present

## 2018-06-28 DIAGNOSIS — R531 Weakness: Secondary | ICD-10-CM | POA: Diagnosis not present

## 2018-06-28 DIAGNOSIS — G500 Trigeminal neuralgia: Secondary | ICD-10-CM | POA: Diagnosis not present

## 2018-06-28 DIAGNOSIS — Z5181 Encounter for therapeutic drug level monitoring: Secondary | ICD-10-CM | POA: Diagnosis not present

## 2018-06-28 DIAGNOSIS — G35 Multiple sclerosis: Secondary | ICD-10-CM | POA: Diagnosis not present

## 2018-06-28 DIAGNOSIS — E039 Hypothyroidism, unspecified: Secondary | ICD-10-CM | POA: Diagnosis not present

## 2018-07-05 ENCOUNTER — Ambulatory Visit: Payer: Self-pay | Admitting: Cardiology

## 2018-07-06 DIAGNOSIS — I4891 Unspecified atrial fibrillation: Secondary | ICD-10-CM | POA: Diagnosis not present

## 2018-07-06 DIAGNOSIS — M8589 Other specified disorders of bone density and structure, multiple sites: Secondary | ICD-10-CM | POA: Diagnosis not present

## 2018-07-06 DIAGNOSIS — N319 Neuromuscular dysfunction of bladder, unspecified: Secondary | ICD-10-CM | POA: Diagnosis not present

## 2018-07-06 DIAGNOSIS — R269 Unspecified abnormalities of gait and mobility: Secondary | ICD-10-CM | POA: Diagnosis not present

## 2018-07-06 DIAGNOSIS — E039 Hypothyroidism, unspecified: Secondary | ICD-10-CM | POA: Diagnosis not present

## 2018-07-06 DIAGNOSIS — G500 Trigeminal neuralgia: Secondary | ICD-10-CM | POA: Diagnosis not present

## 2018-07-06 DIAGNOSIS — G35 Multiple sclerosis: Secondary | ICD-10-CM | POA: Diagnosis not present

## 2018-07-06 DIAGNOSIS — Z5181 Encounter for therapeutic drug level monitoring: Secondary | ICD-10-CM | POA: Diagnosis not present

## 2018-07-20 DIAGNOSIS — G35 Multiple sclerosis: Secondary | ICD-10-CM | POA: Diagnosis not present

## 2018-07-20 DIAGNOSIS — G500 Trigeminal neuralgia: Secondary | ICD-10-CM | POA: Diagnosis not present

## 2018-07-20 DIAGNOSIS — E039 Hypothyroidism, unspecified: Secondary | ICD-10-CM | POA: Diagnosis not present

## 2018-07-20 DIAGNOSIS — Z5181 Encounter for therapeutic drug level monitoring: Secondary | ICD-10-CM | POA: Diagnosis not present

## 2018-07-20 DIAGNOSIS — R269 Unspecified abnormalities of gait and mobility: Secondary | ICD-10-CM | POA: Diagnosis not present

## 2018-07-20 DIAGNOSIS — R531 Weakness: Secondary | ICD-10-CM | POA: Diagnosis not present

## 2018-07-20 DIAGNOSIS — N319 Neuromuscular dysfunction of bladder, unspecified: Secondary | ICD-10-CM | POA: Diagnosis not present

## 2018-07-24 ENCOUNTER — Other Ambulatory Visit: Payer: Self-pay | Admitting: Neurology

## 2018-07-24 DIAGNOSIS — G35 Multiple sclerosis: Secondary | ICD-10-CM

## 2018-07-29 DIAGNOSIS — G35 Multiple sclerosis: Secondary | ICD-10-CM | POA: Diagnosis not present

## 2018-07-29 DIAGNOSIS — R531 Weakness: Secondary | ICD-10-CM | POA: Diagnosis not present

## 2018-07-29 DIAGNOSIS — E039 Hypothyroidism, unspecified: Secondary | ICD-10-CM | POA: Diagnosis not present

## 2018-07-29 DIAGNOSIS — R269 Unspecified abnormalities of gait and mobility: Secondary | ICD-10-CM | POA: Diagnosis not present

## 2018-08-05 DIAGNOSIS — I4891 Unspecified atrial fibrillation: Secondary | ICD-10-CM | POA: Diagnosis not present

## 2018-08-05 DIAGNOSIS — M8589 Other specified disorders of bone density and structure, multiple sites: Secondary | ICD-10-CM | POA: Diagnosis not present

## 2018-08-05 DIAGNOSIS — G35 Multiple sclerosis: Secondary | ICD-10-CM | POA: Diagnosis not present

## 2018-08-05 DIAGNOSIS — E039 Hypothyroidism, unspecified: Secondary | ICD-10-CM | POA: Diagnosis not present

## 2018-08-10 ENCOUNTER — Ambulatory Visit: Payer: Self-pay | Admitting: Cardiology

## 2018-08-11 ENCOUNTER — Ambulatory Visit (INDEPENDENT_AMBULATORY_CARE_PROVIDER_SITE_OTHER): Payer: Medicare HMO | Admitting: Cardiology

## 2018-08-11 ENCOUNTER — Encounter: Payer: Self-pay | Admitting: Cardiology

## 2018-08-11 ENCOUNTER — Other Ambulatory Visit: Payer: Self-pay

## 2018-08-11 VITALS — BP 158/69 | HR 72 | Ht 64.0 in | Wt 157.0 lb

## 2018-08-11 DIAGNOSIS — G35 Multiple sclerosis: Secondary | ICD-10-CM

## 2018-08-11 DIAGNOSIS — I10 Essential (primary) hypertension: Secondary | ICD-10-CM | POA: Diagnosis not present

## 2018-08-11 DIAGNOSIS — I480 Paroxysmal atrial fibrillation: Secondary | ICD-10-CM | POA: Diagnosis not present

## 2018-08-11 NOTE — Progress Notes (Signed)
Primary Physician/Referring:  Clovis Riley, L.August Saucer, MD  Patient ID: Jillian Fisher, female    DOB: 1945-11-12, 73 y.o.   MRN: [id number]  No chief complaint on file.   HPI: Jillian Fisher  is a 73 y.o. female  African-American female with hyperlipidemia, HTN, multiple sclerosis, and PAF first diagnosed in October 2017. She lives  at home with her husband, daughter helping her and now essentially wheel chair and bed bound due to MS. Follows Dr. Lesia Sago for MS.   Nuclear stress test on 02/06/2016 had revealed sinus rhythm and no evidence of ischemia with normal LVEF. Echocardiogram on 02/19/2016 revealed normal LVEF, mild to moderate aortic regurgitation and mild aortic valve annular calcification. There is also mild to moderate MR and mild TR without pulmonary hypertension.  This is her 6 month OV for f/u of atrial fib.  On Eliquis, no bleeding diathesis. No syncope or complaints of palpitations. Daughter present during the virtual visit.  Past Medical History:  Diagnosis Date  . Abnormality of gait 01/25/2013  . Arthritis   . GERD (gastroesophageal reflux disease)   . Hypercholesteremia   . Hypothyroidism   . Multiple sclerosis (HCC)   . Neurogenic bladder 01/25/2013  . Trigeminal neuralgia 07/07/2017   Right V3    Past Surgical History:  Procedure Laterality Date  . ABDOMINAL HYSTERECTOMY      Social History   Socioeconomic History  . Marital status: Married    Spouse name: [name]  . Number of children: 2  . Years of education: 8 th  . Highest education level: Not on file  Occupational History  . Occupation: retired  Engineer, production  . Financial resource strain: Not on file  . Food insecurity:    Worry: Not on file    Inability: Not on file  . Transportation needs:    Medical: Not on file    Non-medical: Not on file  Tobacco Use  . Smoking status: Never Smoker  . Smokeless tobacco: Never Used  . Tobacco comment: quit 30-40 years ago (documented in 2013)   Substance and Sexual Activity  . Alcohol use: No    Alcohol/week: 0.0 standard drinks  . Drug use: No  . Sexual activity: Not on file  Lifestyle  . Physical activity:    Days per week: Not on file    Minutes per session: Not on file  . Stress: Not on file  Relationships  . Social connections:    Talks on phone: Not on file    Gets together: Not on file    Attends religious service: Not on file    Active member of club or organization: Not on file    Attends meetings of clubs or organizations: Not on file    Relationship status: Not on file  . Intimate partner violence:    Fear of current or ex partner: Not on file    Emotionally abused: Not on file    Physically abused: Not on file    Forced sexual activity: Not on file  Other Topics Concern  . Not on file  Social History Narrative   Patient lives at home with her husband.   Retired.   Education 8 th grade   Right handed.   Caffeine none.    Current Outpatient Medications on File Prior to Visit  Medication Sig Dispense Refill  . ALPRAZolam (XANAX) 0.25 MG tablet Take 1 tablet (0.25 mg total) by mouth daily as needed for anxiety. 5 tablet 0  .  apixaban (ELIQUIS) 5 MG TABS tablet Take 1 tablet (5 mg total) by mouth 2 (two) times daily for 30 days. 60 tablet 2  . diclofenac sodium (VOLTAREN) 1 % GEL Apply 2 g topically 4 (four) times daily.    Marland Kitchen. diltiazem (CARDIZEM CD) 240 MG 24 hr capsule Take 1 capsule (240 mg total) by mouth daily. 30 capsule 0  . feeding supplement, ENSURE ENLIVE, (ENSURE ENLIVE) LIQD Take 237 mLs by mouth 2 (two) times daily between meals. 237 mL 12  . gabapentin (NEURONTIN) 300 MG capsule Take 1 capsule (300 mg total) by mouth 4 (four) times daily. 90 capsule 11  . levothyroxine (SYNTHROID, LEVOTHROID) 125 MCG tablet Take 125 mcg by mouth daily before breakfast.   3  . metoprolol tartrate (LOPRESSOR) 25 MG tablet TAKE 1 TABLET BY MOUTH TWICE A DAY 180 tablet 1  . metoprolol tartrate (LOPRESSOR) 50 MG  tablet Take 1 tablet (50 mg total) by mouth 2 (two) times daily.    . Multiple Vitamin (MULTIVITAMIN WITH MINERALS) TABS Take 1 tablet by mouth daily.    Marland Kitchen. omeprazole (PRILOSEC) 20 MG capsule Take 20 mg by mouth daily as needed (reflux).     . Oxcarbazepine (TRILEPTAL) 300 MG tablet TAKE 1 TABLET (300 MG TOTAL) BY MOUTH 2 (TWO) TIMES DAILY. 180 tablet 1  . rosuvastatin (CRESTOR) 20 MG tablet Take 10 mg by mouth daily.     . TECFIDERA 240 MG CPDR RX#2 TAKE 1 CAPSULE BY MOUTH TWICE A DAY 60 capsule 5   No current facility-administered medications on file prior to visit.     Review of Systems  Constitution: Positive for malaise/fatigue. Negative for chills, decreased appetite and weight gain.  Cardiovascular: Negative for dyspnea on exertion, leg swelling and syncope.  Endocrine: Negative for cold intolerance.  Hematologic/Lymphatic: Does not bruise/bleed easily.  Musculoskeletal: Positive for muscle weakness and myalgias (wheel chair bound). Negative for joint swelling.  Gastrointestinal: Negative for abdominal pain, anorexia and change in bowel habit.  Genitourinary: Positive for bladder incontinence.  Neurological: Positive for disturbances in coordination, focal weakness (right leg since MS episode) and loss of balance. Negative for headaches and light-headedness.  Psychiatric/Behavioral: Negative for depression and substance abuse.  All other systems reviewed and are negative.     Objective  There were no vitals taken for this visit. There is no height or weight on file to calculate BMI. Physical exam not performed or limited due to virtual visit.  Patient appeared to be in no distress, Neck was supple, respiration was not labored.  Please see exam details from prior visit is as below.     Physical Exam  Constitutional: She is oriented to person, place, and time. She appears well-developed and well-nourished. No distress.  HENT:  Head: Atraumatic.  Eyes: Conjunctivae are normal.   Neck: Neck supple. No JVD present. No thyromegaly present.  Cardiovascular: Normal rate, regular rhythm, normal heart sounds and intact distal pulses. Exam reveals no gallop.  No murmur heard. Pulmonary/Chest: Effort normal and breath sounds normal.  Abdominal: Soft. Bowel sounds are normal.  Musculoskeletal: Normal range of motion.  Neurological: She is alert and oriented to person, place, and time.  Is wheel chair bound due to MS. Exam not performed  Skin: Skin is warm and dry.  Psychiatric: She has a normal mood and affect.   Radiology: No results found.  Laboratory examination:    CMP Latest Ref Rng & Units 04/27/2018 09/08/2017 09/07/2017  Glucose 65 - 99 mg/dL 130(Q140(H)  127(H) 110(H)  BUN 8 - 27 mg/dL Creatinine 0.57 - 1.00 mg/dL 1.61 0.96 0.45  Sodium 134 - 144 mmol/L 142 139 139  Potassium 3.5 - 5.2 mmol/L 4.1 3.2(L) 3.7  Chloride 96 - 106 mmol/L 103 100(L) 104  CO2 20 - 29 mmol/L Calcium 8.7 - 10.3 mg/dL 9.7 7.9(L) 8.2(L)  Total Protein 6.0 - 8.5 g/dL 6.6 4.0(J) -  Total Bilirubin 0.0 - 1.2 mg/dL <8.1 0.5 -  Alkaline Phos 39 - 117 IU/L 68 59 -  AST 0 - 40 IU/L 14 36 -  ALT 0 - 32 IU/L 9 39 -   CBC Latest Ref Rng & Units 04/27/2018 09/08/2017 09/07/2017  WBC 3.4 - 10.8 x10E3/uL 5.5 6.2 8.4  Hemoglobin 11.1 - 15.9 g/dL 10.8(L) 9.8(L) 10.4(L)  Hematocrit 34.0 - 46.6 % 34.4 31.5(L) 32.5(L)  Platelets 150 - 450 x10E3/uL 604(H) 328 259   Lipid Panel  No results found for: CHOL, TRIG, HDL, CHOLHDL, VLDL, LDLCALC, LDLDIRECT HEMOGLOBIN A1C No results found for: HGBA1C, MPG TSH Recent Labs    09/03/17 0329  TSH 3.017    Cardiac Studies:    Lexiscan myoview stress test 02/06/2016: 1. The resting electrocardiogram demonstrated NORMAL SINUS RHYTM, normal resting conduction, no resting arrhythmias and normal rest repolarization. Stress EKG is non-diagnostic for ischemia as it a pharmacologic stress using Lexiscan. Stress symptoms included dyspnea. 2. Myocardial  perfusion imaging is normal. Overall left ventricular systolic function was normal without regional wall motion abnormalities. The left ventricular ejection fraction was 53%.  Echo- 02/19/2016 1. Left ventricle cavity is normal in size. Moderate concentric hypertrophy of the left ventricle. Septum is sigmoid shape. Normal global wall motion. Calculated EF 67%. 2. Left atrial cavity is moderately dilated. 3. Moderate (Grade II) aortic regurgitation. Mild calcification of the aortic valve annulus. 4. Mild to moderate mitral regurgitation. 5. Mild tricuspid regurgitation. No evidence of pulmonary hypertension  Assessment   PAF (paroxysmal atrial fibrillation) (HCC)  Essential hypertension  Multiple sclerosis (HCC)  EKG 08/31/2017: Sinus tachycardia and 101 bpm, left axis deviation, no evidence of ischemia.  Compared to EKG 2018, heart rate is elevated.  Recommendations:   Patient with paroxysmal atrial fibrillation, maintaining sinus rhythm, presently on appropriate anticoagulation with Eliquis.  CBC and BMP were reviewed and has been stable.  Over the past one year, her physical strength has deteriorated and essentially been bed bound and with a life gets into wheelchair, since an episode of acute MS exacerbation.  I discussed with the daughter whether we should discontinue Eliquis in view of her advancing MS.  Daughter states that she still has good quality of life, until he still sharp, shooting like to continue present therapy.  Blood pressure is slightly elevated today however I would like to continue observation only for now.  Do not want to be too aggressive in blood pressure control, laryngeal goals will be to address end-of-life issues.  I would like to see her back in 6 months for follow-up.  She has a complete physical scheduled in July with Dr. Clovis Riley, daughter will bring the labs to me for review.  Labs from January with reviewed, renal function is remained stable, hemoglobin has  remained stable although she is mildly anemic this is chronic.  Yates Decamp, MD, [facility] 08/11/2018, 7:01 AM [facility] [place] Pager: [phone] Office: [phone] If no answer Cell [phone]

## 2018-08-19 DIAGNOSIS — R269 Unspecified abnormalities of gait and mobility: Secondary | ICD-10-CM | POA: Diagnosis not present

## 2018-08-19 DIAGNOSIS — E039 Hypothyroidism, unspecified: Secondary | ICD-10-CM | POA: Diagnosis not present

## 2018-08-19 DIAGNOSIS — R531 Weakness: Secondary | ICD-10-CM | POA: Diagnosis not present

## 2018-08-19 DIAGNOSIS — G35 Multiple sclerosis: Secondary | ICD-10-CM | POA: Diagnosis not present

## 2018-08-28 DIAGNOSIS — R531 Weakness: Secondary | ICD-10-CM | POA: Diagnosis not present

## 2018-08-28 DIAGNOSIS — G35 Multiple sclerosis: Secondary | ICD-10-CM | POA: Diagnosis not present

## 2018-08-28 DIAGNOSIS — R269 Unspecified abnormalities of gait and mobility: Secondary | ICD-10-CM | POA: Diagnosis not present

## 2018-08-28 DIAGNOSIS — E039 Hypothyroidism, unspecified: Secondary | ICD-10-CM | POA: Diagnosis not present

## 2018-08-30 ENCOUNTER — Telehealth: Payer: Self-pay | Admitting: Neurology

## 2018-08-30 MED ORDER — OXCARBAZEPINE 300 MG PO TABS: 300.0000 mg | ORAL_TABLET | Freq: Three times a day (TID) | ORAL | 1 refills | Status: DC

## 2018-08-30 NOTE — Telephone Encounter (Signed)
Pt daughter has called to inform that pt face pain from Trigeminal neuralgia has come back from yesterday.  Please call

## 2018-08-30 NOTE — Telephone Encounter (Signed)
I called the daughter, the patient began having her trigeminal neuralgia pain yesterday.  The medication doses will temporarily suppress the pain, but it comes back for the next dose.  We will go up on the Trileptal taking 300 mg 3 times daily.  A prescription was sent in.  They will call for any other dose adjustments.

## 2018-08-30 NOTE — Addendum Note (Signed)
Addended by: York Spaniel on: 08/30/2018 01:01 PM   Modules accepted: Orders

## 2018-09-12 ENCOUNTER — Other Ambulatory Visit: Payer: Self-pay | Admitting: Cardiology

## 2018-09-19 DIAGNOSIS — G35 Multiple sclerosis: Secondary | ICD-10-CM | POA: Diagnosis not present

## 2018-09-19 DIAGNOSIS — R269 Unspecified abnormalities of gait and mobility: Secondary | ICD-10-CM | POA: Diagnosis not present

## 2018-09-19 DIAGNOSIS — G500 Trigeminal neuralgia: Secondary | ICD-10-CM | POA: Diagnosis not present

## 2018-09-19 DIAGNOSIS — N319 Neuromuscular dysfunction of bladder, unspecified: Secondary | ICD-10-CM | POA: Diagnosis not present

## 2018-09-19 DIAGNOSIS — Z5181 Encounter for therapeutic drug level monitoring: Secondary | ICD-10-CM | POA: Diagnosis not present

## 2018-09-28 DIAGNOSIS — N319 Neuromuscular dysfunction of bladder, unspecified: Secondary | ICD-10-CM | POA: Diagnosis not present

## 2018-09-28 DIAGNOSIS — G500 Trigeminal neuralgia: Secondary | ICD-10-CM | POA: Diagnosis not present

## 2018-09-28 DIAGNOSIS — G35 Multiple sclerosis: Secondary | ICD-10-CM | POA: Diagnosis not present

## 2018-09-28 DIAGNOSIS — Z5181 Encounter for therapeutic drug level monitoring: Secondary | ICD-10-CM | POA: Diagnosis not present

## 2018-09-28 DIAGNOSIS — R269 Unspecified abnormalities of gait and mobility: Secondary | ICD-10-CM | POA: Diagnosis not present

## 2018-10-13 DIAGNOSIS — H2513 Age-related nuclear cataract, bilateral: Secondary | ICD-10-CM | POA: Diagnosis not present

## 2018-10-13 DIAGNOSIS — H401232 Low-tension glaucoma, bilateral, moderate stage: Secondary | ICD-10-CM | POA: Diagnosis not present

## 2018-10-13 DIAGNOSIS — H25013 Cortical age-related cataract, bilateral: Secondary | ICD-10-CM | POA: Diagnosis not present

## 2018-10-13 DIAGNOSIS — R6889 Other general symptoms and signs: Secondary | ICD-10-CM | POA: Diagnosis not present

## 2018-10-13 DIAGNOSIS — H35033 Hypertensive retinopathy, bilateral: Secondary | ICD-10-CM | POA: Diagnosis not present

## 2018-10-16 ENCOUNTER — Telehealth: Payer: Self-pay | Admitting: Adult Health

## 2018-10-16 MED ORDER — GABAPENTIN 300 MG PO CAPS: 300.0000 mg | ORAL_CAPSULE | Freq: Four times a day (QID) | ORAL | 5 refills | Status: DC

## 2018-10-16 NOTE — Telephone Encounter (Signed)
Renewed prescription for 6 months.  Pt has appt 10-26-18 with SS/NP.

## 2018-10-16 NOTE — Telephone Encounter (Signed)
Pt's daughter called stating her mother is needing a refill on her gabapentin (NEURONTIN) 300 MG capsule called in to [facility] on [address].

## 2018-10-19 DIAGNOSIS — G500 Trigeminal neuralgia: Secondary | ICD-10-CM | POA: Diagnosis not present

## 2018-10-19 DIAGNOSIS — N319 Neuromuscular dysfunction of bladder, unspecified: Secondary | ICD-10-CM | POA: Diagnosis not present

## 2018-10-19 DIAGNOSIS — G35 Multiple sclerosis: Secondary | ICD-10-CM | POA: Diagnosis not present

## 2018-10-19 DIAGNOSIS — Z5181 Encounter for therapeutic drug level monitoring: Secondary | ICD-10-CM | POA: Diagnosis not present

## 2018-10-19 DIAGNOSIS — R269 Unspecified abnormalities of gait and mobility: Secondary | ICD-10-CM | POA: Diagnosis not present

## 2018-10-25 ENCOUNTER — Telehealth: Payer: Self-pay | Admitting: Neurology

## 2018-10-25 DIAGNOSIS — N3281 Overactive bladder: Secondary | ICD-10-CM | POA: Diagnosis not present

## 2018-10-25 DIAGNOSIS — E7800 Pure hypercholesterolemia, unspecified: Secondary | ICD-10-CM | POA: Diagnosis not present

## 2018-10-25 DIAGNOSIS — E039 Hypothyroidism, unspecified: Secondary | ICD-10-CM | POA: Diagnosis not present

## 2018-10-25 DIAGNOSIS — G35 Multiple sclerosis: Secondary | ICD-10-CM | POA: Diagnosis not present

## 2018-10-25 DIAGNOSIS — Z0000 Encounter for general adult medical examination without abnormal findings: Secondary | ICD-10-CM | POA: Diagnosis not present

## 2018-10-25 NOTE — Telephone Encounter (Signed)
Dr. [facility] wants to add on Labs TSH and Lipid Panel .  Please fax back to them fax [phone] Telephone [phone] . Jillian Fisher . Patient has apt with Judson Roch 10/26/2018

## 2018-10-25 NOTE — Progress Notes (Signed)
PATIENT: Jillian Fisher DOB: 1945-08-31  REASON FOR VISIT: follow up HISTORY FROM: patient  HISTORY OF PRESENT ILLNESS: Today 10/26/18  Jillian Fisher is a 73 year old female with history of multiple sclerosis with brain and spinal cord lesions and trigeminal neuralgia.  She remains on Tecfidera for MS.  She has been on Tecfidera since summer 2019, she had a severe MS attack in April 2019.  She is no longer able to stand or ambulate. For trigeminal neuralgia she remains on gabapentin 300 mg capsule, 1 capsule 4 times a day, Trileptal 300 mg, 1 tablet 3 times daily.  She reports her trigeminal neuralgia pain has been under good control.  She remains on Tecfidera, is tolerating medication well.  Her daughter reports over the last month they have noticed more movement in her right leg, greater strength.  She is not able to stand or ambulate.  She remains in a wheelchair, has a Nurse, adultHoyer lift she uses in the home.  She lives with her husband, and her daughter is nearby and helps out daily.  Overall, she feels her condition is stable, possible slight improvement with increasing strength in her right leg.  She does have history of neurogenic bladder.  Her daughter indicates she is aware when she has to urinate, but she uses an incontinence brief.  She is planning to have cataract surgery on both eyes in the next few weeks.  She is sleeping well.  She has a good appetite, is eating much more and is no longer having to take Ensure.  They had a virtual visit with her primary, Dr. Clovis RileyMitchell yesterday.  They have requested we obtain a TSH and lipid panel.  The patient indicates her overall health has been well since last visit.  She presents today for follow-up accompanied by her daughter.  HISTORY 04/27/2018 Dr. Anne HahnWillis: Jillian Fisher is a 73 year old right-handed black female with a history of multiple sclerosis with brain and spinal cord lesions. The patient has not done well since last seen.  She has been placed on  Tecfidera in the summer 2019, she had a severe MS attack in April 2019.  She has had significant weakness of the right leg, she is no longer able to stand or ambulate.  The patient lives at home with her husband, her daughter comes over to help frequently.  The patient has not had any falls, her daughter basically has to lift her out of her hospital bed at home to a chair or to the commode.  The patient may have had some gradual changes in her clinical condition since the summer, she did undergo some physical therapy in the home environment.  She currently is on Tecfidera which was started in June 2019.  She is tolerating the medication well.  She has trigeminal neuralgia and her pain is increased and December 2019, she has gone up on the gabapentin taking 4 of the 300 mg capsules daily which seem to help, she is also on Trileptal taking 300 mg twice daily.  She returns for an evaluation.  She does have a history of neurogenic bladder, she is followed through urology.   REVIEW OF SYSTEMS: Out of a complete 14 system review of symptoms, the patient complains only of the following symptoms, and all other reviewed systems are negative.  Weakness, gait abnormality, facial pain  ALLERGIES: Allergies  Allergen Reactions  . Sulfa Antibiotics Rash    HOME MEDICATIONS: Outpatient Medications Prior to Visit  Medication Sig Dispense Refill  .  ALPRAZolam (XANAX) 0.25 MG tablet Take 1 tablet (0.25 mg total) by mouth daily as needed for anxiety. 5 tablet 0  . diclofenac sodium (VOLTAREN) 1 % GEL Apply 2 g topically 4 (four) times daily.    Marland Kitchen. diltiazem (CARDIZEM CD) 240 MG 24 hr capsule Take 1 capsule (240 mg total) by mouth daily. 30 capsule 0  . ELIQUIS 5 MG TABS tablet TAKE 1 TABLET BY MOUTH TWICE A DAY 60 tablet 2  . gabapentin (NEURONTIN) 300 MG capsule Take 1 capsule (300 mg total) by mouth 4 (four) times daily. 120 capsule 5  . levothyroxine (SYNTHROID, LEVOTHROID) 125 MCG tablet Take 125 mcg by mouth  daily before breakfast.   3  . metoprolol tartrate (LOPRESSOR) 25 MG tablet TAKE 1 TABLET BY MOUTH TWICE A DAY 180 tablet 1  . Multiple Vitamin (MULTIVITAMIN WITH MINERALS) TABS Take 1 tablet by mouth daily.    Marland Kitchen. omeprazole (PRILOSEC) 20 MG capsule Take 20 mg by mouth daily as needed (reflux).     . Oxcarbazepine (TRILEPTAL) 300 MG tablet Take 1 tablet (300 mg total) by mouth 3 (three) times daily. 270 tablet 1  . rosuvastatin (CRESTOR) 20 MG tablet Take 10 mg by mouth daily.     . TECFIDERA 240 MG CPDR RX#2 TAKE 1 CAPSULE BY MOUTH TWICE A DAY 60 capsule 5  . feeding supplement, ENSURE ENLIVE, (ENSURE ENLIVE) LIQD Take 237 mLs by mouth 2 (two) times daily between meals. (Patient not taking: Reported on 10/26/2018) 237 mL 12  . metoprolol tartrate (LOPRESSOR) 50 MG tablet Take 1 tablet (50 mg total) by mouth 2 (two) times daily.     No facility-administered medications prior to visit.     PAST MEDICAL HISTORY: Past Medical History:  Diagnosis Date  . Abnormality of gait 01/25/2013  . Arthritis   . GERD (gastroesophageal reflux disease)   . Hypercholesteremia   . Hypothyroidism   . Multiple sclerosis (HCC)   . Neurogenic bladder 01/25/2013  . Trigeminal neuralgia 07/07/2017   Right V3    PAST SURGICAL HISTORY: Past Surgical History:  Procedure Laterality Date  . ABDOMINAL HYSTERECTOMY      FAMILY HISTORY: Family History  Problem Relation Age of Onset  . Heart attack Mother   . Multiple sclerosis Sister   . Heart disease Sister   . Diabetes Other     SOCIAL HISTORY: Social History   Socioeconomic History  . Marital status: Married    Spouse name: Fayrene Fearingjames  . Number of children: 2  . Years of education: 8 th  . Highest education level: Not on file  Occupational History  . Occupation: retired  Engineer, productionocial Needs  . Financial resource strain: Not on file  . Food insecurity    Worry: Not on file    Inability: Not on file  . Transportation needs    Medical: Not on file     Non-medical: Not on file  Tobacco Use  . Smoking status: Former Smoker    Years: 1.00    Types: Cigarettes    Quit date: 08/10/1988    Years since quitting: 30.2  . Smokeless tobacco: Never Used  . Tobacco comment: quit 30-40 years ago (documented in 2013)  Substance and Sexual Activity  . Alcohol use: No    Alcohol/week: 0.0 standard drinks  . Drug use: No  . Sexual activity: Not on file  Lifestyle  . Physical activity    Days per week: Not on file    Minutes per session:  Not on file  . Stress: Not on file  Relationships  . Social Musician on phone: Not on file    Gets together: Not on file    Attends religious service: Not on file    Active member of club or organization: Not on file    Attends meetings of clubs or organizations: Not on file    Relationship status: Not on file  . Intimate partner violence    Fear of current or ex partner: Not on file    Emotionally abused: Not on file    Physically abused: Not on file    Forced sexual activity: Not on file  Other Topics Concern  . Not on file  Social History Narrative   Patient lives at home with her husband.   Retired.   Education 8 th grade   Right handed.   Caffeine none.    PHYSICAL EXAM  Vitals:   10/26/18 1005  BP: 106/64  Pulse: 64  Temp: 97.7 F (36.5 C)   There is no height or weight on file to calculate BMI.  Generalized: Well developed, in no acute distress  Neurological examination  Mentation: Alert oriented to time, place, her daughter provides most of the history. Follows all commands speech and language fluent Cranial nerve II-XII: Pupils were equal round reactive to light. Extraocular movements were full, visual field were full on confrontational test. Facial sensation and strength were normal. Uvula tongue midline. Head turning and shoulder shrug were normal and symmetric. Motor: The motor testing reveals 4/5 strength left upper and lower extremity, 3/5 strength right upper and 2/5  right lower extremity, limited movement of RLE. Good symmetric motor tone is noted throughout.  Sensory: Sensory testing is intact to soft touch on all 4 extremities. No evidence of extinction is noted.  Coordination: Cerebellar testing reveals good finger-nose-finger, unable to perform heel-to-shin Gait and station: In a wheelchair, Hoyer pad underneath, is not able to stand or ambulate Reflexes: Deep tendon reflexes are symmetric but decreased bilaterally  DIAGNOSTIC DATA (LABS, IMAGING, TESTING) - I reviewed patient records, labs, notes, testing and imaging myself where available.  Lab Results  Component Value Date   WBC 5.5 04/27/2018   HGB 10.8 (L) 04/27/2018   HCT 34.4 04/27/2018   MCV 87 04/27/2018   PLT 604 (H) 04/27/2018      Component Value Date/Time   NA 142 04/27/2018 1001   K 4.1 04/27/2018 1001   CL 103 04/27/2018 1001   CO2 21 04/27/2018 1001   GLUCOSE 140 (H) 04/27/2018 1001   GLUCOSE 127 (H) 09/08/2017 0822   BUN 9 04/27/2018 1001   CREATININE 0.58 04/27/2018 1001   CALCIUM 9.7 04/27/2018 1001   PROT 6.6 04/27/2018 1001   ALBUMIN 3.0 (L) 04/27/2018 1001   AST 14 04/27/2018 1001   ALT 9 04/27/2018 1001   ALKPHOS 68 04/27/2018 1001   BILITOT <0.2 04/27/2018 1001   GFRNONAA 93 04/27/2018 1001   GFRAA 107 04/27/2018 1001   No results found for: CHOL, HDL, LDLCALC, LDLDIRECT, TRIG, CHOLHDL No results found for: OMVE7M No results found for: VITAMINB12 Lab Results  Component Value Date   TSH 3.017 09/03/2017    ASSESSMENT AND PLAN 73 y.o. year old female  has a past medical history of Abnormality of gait (01/25/2013), Arthritis, GERD (gastroesophageal reflux disease), Hypercholesteremia, Hypothyroidism, Multiple sclerosis (HCC), Neurogenic bladder (01/25/2013), and Trigeminal neuralgia (07/07/2017). here with:  1.  Multiple sclerosis 2.  Gait disturbance 3.  Trigeminal neuralgia  She remains on Tecfidera.  She is tolerating the medication well.  Her  clinical condition has remained stable since last visit.  If anything, over the last month her daughter reports she has regained some strength in her right leg, however she is still unable to ambulate or stand independently.  Fortunately, she has a Civil Service fast streamer at home, has good family support.  Her trigeminal neuralgia is under good control with current medications.  She will continue taking gabapentin and Trileptal.  Her primary care doctor, Dr. Alroy Dust requested we obtain TSH and lipid panel with routine labs.  I will order those labs, in addition to a Trileptal level and a CBC and CMP to screen for adverse effect of Tecfidera.  Once labs result, I will ensure Dr. Alroy Dust received a copy.  It is important to consider, that if the patient continues to gradually slowly worsen over time, we should consider switch Ocrevus.  I did discuss this with the patient and her daughter.  They will closely monitor her condition and let us know of any change.  She will follow-up in 6 months needed.  I advised that if her symptoms worsen or she develops any new symptoms she should let us know.  She will be undergoing surgery for bilateral cataracts in the next few weeks.    I spent 25 minutes with the patient. 50% of this time was spent discussing her plan of care.   Butler Denmark, AGNP-C, DNP 10/26/2018, 10:42 AM [facility] [address], [place], [place] [address] [phone]

## 2018-10-25 NOTE — Telephone Encounter (Signed)
We can get this blood work done when the patient comes in for a visit tomorrow.

## 2018-10-26 ENCOUNTER — Ambulatory Visit (INDEPENDENT_AMBULATORY_CARE_PROVIDER_SITE_OTHER): Payer: Medicare HMO | Admitting: Neurology

## 2018-10-26 ENCOUNTER — Encounter: Payer: Self-pay | Admitting: Neurology

## 2018-10-26 ENCOUNTER — Other Ambulatory Visit: Payer: Self-pay

## 2018-10-26 VITALS — BP 106/64 | HR 64 | Temp 97.7°F

## 2018-10-26 DIAGNOSIS — R269 Unspecified abnormalities of gait and mobility: Secondary | ICD-10-CM | POA: Diagnosis not present

## 2018-10-26 DIAGNOSIS — G35 Multiple sclerosis: Secondary | ICD-10-CM

## 2018-10-26 DIAGNOSIS — G35D Multiple sclerosis, unspecified: Secondary | ICD-10-CM

## 2018-10-26 DIAGNOSIS — R6889 Other general symptoms and signs: Secondary | ICD-10-CM | POA: Diagnosis not present

## 2018-10-26 DIAGNOSIS — G500 Trigeminal neuralgia: Secondary | ICD-10-CM | POA: Diagnosis not present

## 2018-10-26 DIAGNOSIS — Z79899 Other long term (current) drug therapy: Secondary | ICD-10-CM | POA: Diagnosis not present

## 2018-10-26 NOTE — Progress Notes (Signed)
I have read the note, and I agree with the clinical assessment and plan.  Katianna Mcclenney K Malashia Kamaka   

## 2018-10-28 DIAGNOSIS — N319 Neuromuscular dysfunction of bladder, unspecified: Secondary | ICD-10-CM | POA: Diagnosis not present

## 2018-10-28 DIAGNOSIS — Z5181 Encounter for therapeutic drug level monitoring: Secondary | ICD-10-CM | POA: Diagnosis not present

## 2018-10-28 DIAGNOSIS — G500 Trigeminal neuralgia: Secondary | ICD-10-CM | POA: Diagnosis not present

## 2018-10-28 DIAGNOSIS — R269 Unspecified abnormalities of gait and mobility: Secondary | ICD-10-CM | POA: Diagnosis not present

## 2018-10-28 DIAGNOSIS — G35 Multiple sclerosis: Secondary | ICD-10-CM | POA: Diagnosis not present

## 2018-10-28 LAB — COMPREHENSIVE METABOLIC PANEL
ALT: 13 IU/L (ref 0–32)
AST: 16 IU/L (ref 0–40)
Albumin/Globulin Ratio: 1.3 (ref 1.2–2.2)
Albumin: 4.2 g/dL (ref 3.7–4.7)
Alkaline Phosphatase: 65 IU/L (ref 39–117)
BUN/Creatinine Ratio: 27 (ref 12–28)
BUN: 13 mg/dL (ref 8–27)
Bilirubin Total: 0.3 mg/dL (ref 0.0–1.2)
CO2: 22 mmol/L (ref 20–29)
Calcium: 9.7 mg/dL (ref 8.7–10.3)
Chloride: 95 mmol/L — ABNORMAL LOW (ref 96–106)
Creatinine, Ser: 0.48 mg/dL — ABNORMAL LOW (ref 0.57–1.00)
GFR calc Af Amer: 113 mL/min/{1.73_m2} (ref >59)
GFR calc non Af Amer: 98 mL/min/{1.73_m2} (ref >59)
Globulin, Total: 3.3 g/dL (ref 1.5–4.5)
Glucose: 120 mg/dL — ABNORMAL HIGH (ref 65–99)
Potassium: 4.6 mmol/L (ref 3.5–5.2)
Sodium: 131 mmol/L — ABNORMAL LOW (ref 134–144)
Total Protein: 7.5 g/dL (ref 6.0–8.5)

## 2018-10-28 LAB — CBC WITH DIFFERENTIAL/PLATELET
Basophils Absolute: 0.1 10*3/uL (ref 0.0–0.2)
Basos: 1 %
EOS (ABSOLUTE): 0.1 10*3/uL (ref 0.0–0.4)
Eos: 2 %
Hematocrit: 36.5 % (ref 34.0–46.6)
Hemoglobin: 11.9 g/dL (ref 11.1–15.9)
Immature Grans (Abs): 0 10*3/uL (ref 0.0–0.1)
Immature Granulocytes: 0 %
Lymphocytes Absolute: 1.4 10*3/uL (ref 0.7–3.1)
Lymphs: 23 %
MCH: 26.7 pg (ref 26.6–33.0)
MCHC: 32.6 g/dL (ref 31.5–35.7)
MCV: 82 fL (ref 79–97)
Monocytes Absolute: 0.5 10*3/uL (ref 0.1–0.9)
Monocytes: 9 %
Neutrophils Absolute: 3.9 10*3/uL (ref 1.4–7.0)
Neutrophils: 65 %
Platelets: 351 10*3/uL (ref 150–450)
RBC: 4.45 x10E6/uL (ref 3.77–5.28)
RDW: 17.5 % — ABNORMAL HIGH (ref 11.7–15.4)
WBC: 6 10*3/uL (ref 3.4–10.8)

## 2018-10-28 LAB — LIPID PANEL
Chol/HDL Ratio: 2.6 ratio (ref 0.0–4.4)
Cholesterol, Total: 170 mg/dL (ref 100–199)
HDL: 66 mg/dL (ref >39)
LDL Calculated: 93 mg/dL (ref 0–99)
Triglycerides: 53 mg/dL (ref 0–149)
VLDL Cholesterol Cal: 11 mg/dL (ref 5–40)

## 2018-10-28 LAB — 10-HYDROXYCARBAZEPINE: Oxcarbazepine SerPl-Mcnc: 25 ug/mL (ref 10–35)

## 2018-10-28 LAB — TSH: TSH: 5.81 u[IU]/mL — ABNORMAL HIGH (ref 0.450–4.500)

## 2018-10-30 ENCOUNTER — Telehealth: Payer: Self-pay | Admitting: Neurology

## 2018-10-30 DIAGNOSIS — G35 Multiple sclerosis: Secondary | ICD-10-CM

## 2018-10-30 MED ORDER — OXCARBAZEPINE 300 MG PO TABS: 300.0000 mg | ORAL_TABLET | Freq: Two times a day (BID) | ORAL | 1 refills | Status: DC

## 2018-10-30 NOTE — Telephone Encounter (Signed)
Fax confirmation received Dr. Marlou Sa [phone].  (lab results).

## 2018-10-30 NOTE — Telephone Encounter (Signed)
I called the patient, she asked me to contact her daughter Katharine Look.  I reviewed lab work with her daughter.  Her sodium level was low at 131.  We will decrease her Trileptal to 300 mg twice a day.  She will come back in 1 month to have it rechecked.  I have placed the order.  At current, her trigeminal neuralgia is under good control.  She does remain on Tecfidera, her absolute lymphocyte count is within good range at 1.4.  Her TSH was mildly elevated at 5.810.  Please fax her lab work to Dr. Marlou Sa at [place].

## 2018-10-30 NOTE — Telephone Encounter (Signed)
Ready to fax.  Phone service out.

## 2018-11-02 DIAGNOSIS — H2513 Age-related nuclear cataract, bilateral: Secondary | ICD-10-CM | POA: Diagnosis not present

## 2018-11-02 DIAGNOSIS — H25013 Cortical age-related cataract, bilateral: Secondary | ICD-10-CM | POA: Diagnosis not present

## 2018-11-02 DIAGNOSIS — R6889 Other general symptoms and signs: Secondary | ICD-10-CM | POA: Diagnosis not present

## 2018-11-02 DIAGNOSIS — H25012 Cortical age-related cataract, left eye: Secondary | ICD-10-CM | POA: Diagnosis not present

## 2018-11-07 ENCOUNTER — Other Ambulatory Visit: Payer: Self-pay | Admitting: Neurology

## 2018-11-09 ENCOUNTER — Telehealth: Payer: Self-pay | Admitting: Neurology

## 2018-11-09 MED ORDER — GABAPENTIN 600 MG PO TABS: 600.0000 mg | ORAL_TABLET | Freq: Three times a day (TID) | ORAL | 3 refills | Status: DC

## 2018-11-09 NOTE — Telephone Encounter (Signed)
Pts daughter Katharine Look called in and stated that since the change in the Oxcarbazepine (TRILEPTAL) 300 MG tablet pt has been experiencing a lot of pain  CB# [phone]

## 2018-11-09 NOTE — Telephone Encounter (Signed)
Pts trileptal was decreased to 300mg  po BID 10-30-18 from TID dosing due to low Na.  Please advise.

## 2018-11-09 NOTE — Telephone Encounter (Signed)
I called and talk with the daughter.  The patient is at increased pain with reduction in the Trileptal dosing.  The patient was reduced because of hyponatremia on the Trileptal.  We will try increasing the gabapentin to 600 mg 3 times a day and see if this helps her pain.

## 2018-11-10 DIAGNOSIS — N310 Uninhibited neuropathic bladder, not elsewhere classified: Secondary | ICD-10-CM | POA: Diagnosis not present

## 2018-11-19 DIAGNOSIS — G500 Trigeminal neuralgia: Secondary | ICD-10-CM | POA: Diagnosis not present

## 2018-11-19 DIAGNOSIS — Z5181 Encounter for therapeutic drug level monitoring: Secondary | ICD-10-CM | POA: Diagnosis not present

## 2018-11-19 DIAGNOSIS — N319 Neuromuscular dysfunction of bladder, unspecified: Secondary | ICD-10-CM | POA: Diagnosis not present

## 2018-11-19 DIAGNOSIS — R269 Unspecified abnormalities of gait and mobility: Secondary | ICD-10-CM | POA: Diagnosis not present

## 2018-11-19 DIAGNOSIS — G35 Multiple sclerosis: Secondary | ICD-10-CM | POA: Diagnosis not present

## 2018-11-28 ENCOUNTER — Telehealth: Payer: Self-pay | Admitting: *Deleted

## 2018-11-28 DIAGNOSIS — G35 Multiple sclerosis: Secondary | ICD-10-CM | POA: Diagnosis not present

## 2018-11-28 DIAGNOSIS — Z5181 Encounter for therapeutic drug level monitoring: Secondary | ICD-10-CM | POA: Diagnosis not present

## 2018-11-28 DIAGNOSIS — N319 Neuromuscular dysfunction of bladder, unspecified: Secondary | ICD-10-CM | POA: Diagnosis not present

## 2018-11-28 DIAGNOSIS — R269 Unspecified abnormalities of gait and mobility: Secondary | ICD-10-CM | POA: Diagnosis not present

## 2018-11-28 DIAGNOSIS — G500 Trigeminal neuralgia: Secondary | ICD-10-CM | POA: Diagnosis not present

## 2018-11-28 NOTE — Telephone Encounter (Signed)
Received [facility] fax asking for diagnosis codes for labs requested.  G35, G50.0, R26.9, Z79.899.  Fax confirmation received.

## 2018-11-30 ENCOUNTER — Other Ambulatory Visit: Payer: Self-pay

## 2018-11-30 DIAGNOSIS — I10 Essential (primary) hypertension: Secondary | ICD-10-CM

## 2018-11-30 MED ORDER — METOPROLOL TARTRATE 25 MG PO TABS: 25.0000 mg | ORAL_TABLET | Freq: Two times a day (BID) | ORAL | 1 refills | Status: DC

## 2018-12-01 ENCOUNTER — Other Ambulatory Visit: Payer: Self-pay

## 2018-12-08 DIAGNOSIS — R3914 Feeling of incomplete bladder emptying: Secondary | ICD-10-CM | POA: Diagnosis not present

## 2018-12-08 DIAGNOSIS — N310 Uninhibited neuropathic bladder, not elsewhere classified: Secondary | ICD-10-CM | POA: Diagnosis not present

## 2018-12-08 DIAGNOSIS — R6889 Other general symptoms and signs: Secondary | ICD-10-CM | POA: Diagnosis not present

## 2018-12-10 ENCOUNTER — Other Ambulatory Visit: Payer: Self-pay | Admitting: Cardiology

## 2018-12-29 DIAGNOSIS — R269 Unspecified abnormalities of gait and mobility: Secondary | ICD-10-CM | POA: Diagnosis not present

## 2018-12-29 DIAGNOSIS — G35 Multiple sclerosis: Secondary | ICD-10-CM | POA: Diagnosis not present

## 2018-12-29 DIAGNOSIS — Z5181 Encounter for therapeutic drug level monitoring: Secondary | ICD-10-CM | POA: Diagnosis not present

## 2018-12-29 DIAGNOSIS — N319 Neuromuscular dysfunction of bladder, unspecified: Secondary | ICD-10-CM | POA: Diagnosis not present

## 2018-12-29 DIAGNOSIS — G500 Trigeminal neuralgia: Secondary | ICD-10-CM | POA: Diagnosis not present

## 2019-01-09 ENCOUNTER — Other Ambulatory Visit: Payer: Self-pay

## 2019-01-09 DIAGNOSIS — I10 Essential (primary) hypertension: Secondary | ICD-10-CM

## 2019-01-09 MED ORDER — DILTIAZEM HCL ER COATED BEADS 240 MG PO CP24: 240.0000 mg | ORAL_CAPSULE | Freq: Every day | ORAL | 0 refills | Status: DC

## 2019-01-17 ENCOUNTER — Other Ambulatory Visit: Payer: Self-pay | Admitting: Neurology

## 2019-01-17 DIAGNOSIS — G35 Multiple sclerosis: Secondary | ICD-10-CM

## 2019-01-26 ENCOUNTER — Other Ambulatory Visit: Payer: Self-pay

## 2019-01-28 DIAGNOSIS — N319 Neuromuscular dysfunction of bladder, unspecified: Secondary | ICD-10-CM | POA: Diagnosis not present

## 2019-01-28 DIAGNOSIS — Z5181 Encounter for therapeutic drug level monitoring: Secondary | ICD-10-CM | POA: Diagnosis not present

## 2019-01-28 DIAGNOSIS — G35 Multiple sclerosis: Secondary | ICD-10-CM | POA: Diagnosis not present

## 2019-01-28 DIAGNOSIS — R269 Unspecified abnormalities of gait and mobility: Secondary | ICD-10-CM | POA: Diagnosis not present

## 2019-01-28 DIAGNOSIS — G500 Trigeminal neuralgia: Secondary | ICD-10-CM | POA: Diagnosis not present

## 2019-01-31 ENCOUNTER — Other Ambulatory Visit: Payer: Self-pay

## 2019-01-31 DIAGNOSIS — I10 Essential (primary) hypertension: Secondary | ICD-10-CM

## 2019-01-31 MED ORDER — DILTIAZEM HCL ER COATED BEADS 240 MG PO CP24: 240.0000 mg | ORAL_CAPSULE | Freq: Every day | ORAL | 1 refills | Status: DC

## 2019-02-06 ENCOUNTER — Other Ambulatory Visit: Payer: Self-pay

## 2019-02-06 DIAGNOSIS — I10 Essential (primary) hypertension: Secondary | ICD-10-CM

## 2019-02-06 MED ORDER — DILTIAZEM HCL ER COATED BEADS 240 MG PO CP24: 240.0000 mg | ORAL_CAPSULE | Freq: Every day | ORAL | 1 refills | Status: DC

## 2019-02-15 ENCOUNTER — Ambulatory Visit: Payer: Medicare HMO | Admitting: Cardiology

## 2019-02-28 DIAGNOSIS — N319 Neuromuscular dysfunction of bladder, unspecified: Secondary | ICD-10-CM | POA: Diagnosis not present

## 2019-02-28 DIAGNOSIS — Z5181 Encounter for therapeutic drug level monitoring: Secondary | ICD-10-CM | POA: Diagnosis not present

## 2019-02-28 DIAGNOSIS — G500 Trigeminal neuralgia: Secondary | ICD-10-CM | POA: Diagnosis not present

## 2019-02-28 DIAGNOSIS — R269 Unspecified abnormalities of gait and mobility: Secondary | ICD-10-CM | POA: Diagnosis not present

## 2019-02-28 DIAGNOSIS — G35 Multiple sclerosis: Secondary | ICD-10-CM | POA: Diagnosis not present

## 2019-03-25 ENCOUNTER — Other Ambulatory Visit: Payer: Self-pay | Admitting: Cardiology

## 2019-03-30 DIAGNOSIS — R269 Unspecified abnormalities of gait and mobility: Secondary | ICD-10-CM | POA: Diagnosis not present

## 2019-03-30 DIAGNOSIS — Z5181 Encounter for therapeutic drug level monitoring: Secondary | ICD-10-CM | POA: Diagnosis not present

## 2019-03-30 DIAGNOSIS — N319 Neuromuscular dysfunction of bladder, unspecified: Secondary | ICD-10-CM | POA: Diagnosis not present

## 2019-03-30 DIAGNOSIS — G500 Trigeminal neuralgia: Secondary | ICD-10-CM | POA: Diagnosis not present

## 2019-03-30 DIAGNOSIS — G35 Multiple sclerosis: Secondary | ICD-10-CM | POA: Diagnosis not present

## 2019-04-02 ENCOUNTER — Ambulatory Visit (INDEPENDENT_AMBULATORY_CARE_PROVIDER_SITE_OTHER): Payer: Medicare HMO | Admitting: Cardiology

## 2019-04-02 ENCOUNTER — Other Ambulatory Visit: Payer: Self-pay

## 2019-04-02 ENCOUNTER — Encounter: Payer: Self-pay | Admitting: Cardiology

## 2019-04-02 VITALS — BP 130/48 | HR 49 | Temp 97.7°F | Ht 65.0 in

## 2019-04-02 DIAGNOSIS — I351 Nonrheumatic aortic (valve) insufficiency: Secondary | ICD-10-CM

## 2019-04-02 DIAGNOSIS — I480 Paroxysmal atrial fibrillation: Secondary | ICD-10-CM

## 2019-04-02 DIAGNOSIS — I340 Nonrheumatic mitral (valve) insufficiency: Secondary | ICD-10-CM

## 2019-04-02 DIAGNOSIS — R6889 Other general symptoms and signs: Secondary | ICD-10-CM | POA: Diagnosis not present

## 2019-04-02 NOTE — Progress Notes (Signed)
Primary Physician/Referring:  Alroy Dust, L.Marlou Sa, MD  Patient ID: Jillian Fisher, female    DOB: [birth year], 73 y.o.   MRN: [id number]  Chief Complaint  Patient presents with  . Atrial Fibrillation  . Hypertension  . Follow-up    65mo   HPI:    Jillian Fisher  is a 73 y.o. African-American female with hyperlipidemia, HTN, multiple sclerosis, and PAF first diagnosed in October 2017. She lives  at home with her husband, daughter helping her and now essentially wheel chair and bed bound due to [place]. Follows Dr. Floyde Parkins for MS.    Nuclear stress test on 02/06/2016 had revealed sinus rhythm and no evidence of ischemia with normal LVEF. Echocardiogram on 02/19/2016 revealed normal LVEF, mild to moderate aortic regurgitation and mild aortic valve annular calcification. There is also mild to moderate MR and mild TR without pulmonary hypertension.  This is her 6 month OV for f/u of atrial fib.  On Eliquis, no bleeding diathesis. No syncope or complaints of palpitations. Daughter present.  Past Medical History:  Diagnosis Date  . Abnormality of gait 01/25/2013  . Arthritis   . GERD (gastroesophageal reflux disease)   . Hypercholesteremia   . Hypothyroidism   . Multiple sclerosis ([place])   . Neurogenic bladder 01/25/2013  . Trigeminal neuralgia 07/07/2017   Right V3   Past Surgical History:  Procedure Laterality Date  . ABDOMINAL HYSTERECTOMY     Social History   Socioeconomic History  . Marital status: Married    Spouse name: [name]  . Number of children: 2  . Years of education: 8 th  . Highest education level: Not on file  Occupational History  . Occupation: retired  Tobacco Use  . Smoking status: Former Smoker    Packs/day: 0.25    Years: 1.00    Pack years: 0.25    Types: Cigarettes    Quit date: 08/10/1988    Years since quitting: 30.6  . Smokeless tobacco: Never Used  . Tobacco comment: quit 30-40 years ago (documented in 2013)  Substance and Sexual Activity  .  Alcohol use: No    Alcohol/week: 0.0 standard drinks  . Drug use: No  . Sexual activity: Not on file  Other Topics Concern  . Not on file  Social History Narrative   Patient lives at home with her husband.   Retired.   Education 8 th grade   Right handed.   Caffeine none.   Social Determinants of Health   Financial Resource Strain:   . Difficulty of Paying Living Expenses: Not on file  Food Insecurity:   . Worried About Charity fundraiser in the Last Year: Not on file  . Ran Out of Food in the Last Year: Not on file  Transportation Needs:   . Lack of Transportation (Medical): Not on file  . Lack of Transportation (Non-Medical): Not on file  Physical Activity:   . Days of Exercise per Week: Not on file  . Minutes of Exercise per Session: Not on file  Stress:   . Feeling of Stress : Not on file  Social Connections:   . Frequency of Communication with Friends and Family: Not on file  . Frequency of Social Gatherings with Friends and Family: Not on file  . Attends Religious Services: Not on file  . Active Member of Clubs or Organizations: Not on file  . Attends Archivist Meetings: Not on file  . Marital Status: Not on file  Intimate  Partner Violence:   . Fear of Current or Ex-Partner: Not on file  . Emotionally Abused: Not on file  . Physically Abused: Not on file  . Sexually Abused: Not on file   ROS  Review of Systems  Constitution: Positive for malaise/fatigue. Negative for chills, decreased appetite and weight gain.  Cardiovascular: Negative for dyspnea on exertion, leg swelling and syncope.  Endocrine: Negative for cold intolerance.  Hematologic/Lymphatic: Does not bruise/bleed easily.  Musculoskeletal: Positive for muscle weakness (Wheel chair bound) and myalgias. Negative for joint swelling.  Gastrointestinal: Negative for abdominal pain, anorexia and change in bowel habit.  Genitourinary: Positive for bladder incontinence.  Neurological: Positive for  disturbances in coordination, focal weakness (right leg since MS episode) and loss of balance. Negative for headaches and light-headedness.  Psychiatric/Behavioral: Negative for depression and substance abuse.  All other systems reviewed and are negative.  Objective  Blood pressure (!) 130/48, pulse (!) 49, temperature 97.7 F (36.5 C), height 5\' 5"  (1.651 m), SpO2 98 %.  Vitals with BMI 04/02/2019 10/26/2018 08/11/2018  Height 5\' 5"  - 5\' 4"   Weight - (No Data) 157 lbs  BMI - - 26.94  Systolic 130 106 [id number]  Diastolic 48 64 69  Pulse 49 64 72     Physical Exam  Constitutional: She is oriented to person, place, and time.  She is moderately built and [place] in no acute distress.  Appears withdrawn.  HENT:  Head: Atraumatic.  Eyes: Conjunctivae are normal.  Neck: No JVD present. No thyromegaly present.  Cardiovascular: Normal rate, regular rhythm, intact distal pulses and normal pulses. Exam reveals no gallop.  Murmur heard.  Midsystolic murmur is present with a grade of 2/6 at the apex.  Blowing decrescendo early diastolic murmur is present with a grade of 3/6 at the upper right sternal border and apex. Pulmonary/Chest: Effort normal and breath sounds normal.  Abdominal: Soft. Bowel sounds are normal.  Musculoskeletal:     Cervical back: Neck supple.  Neurological: She is alert and oriented to person, place, and time.  Is wheel chair bound due to MS. Exam not performed  Skin: Skin is warm and dry.   Laboratory examination:   Recent Labs    04/27/18 1001 10/26/18 1051  NA 142 131*  K 4.1 4.6  CL 103 95*  CO2 21 22  GLUCOSE 140* 120*  BUN 9 13  CREATININE 0.58 0.48*  CALCIUM 9.7 9.7  GFRNONAA 93 98  GFRAA 107 113   CrCl cannot be calculated (Patient's most recent lab result is older than the maximum 21 days allowed.).  CMP Latest Ref Rng & Units 10/26/2018 04/27/2018 09/08/2017  Glucose 65 - 99 mg/dL 096(E120(H) 454(U140(H) 981(X127(H)  BUN 8 - 27 mg/dL 13 9 9   Creatinine 0.57 - 1.00 mg/dL  9.14(N0.48(L) 8.290.58 5.620.85  Sodium 134 - 144 mmol/L 131(L) 142 139  Potassium 3.5 - 5.2 mmol/L 4.6 4.1 3.2(L)  Chloride 96 - 106 mmol/L 95(L) 103 100(L)  CO2 20 - 29 mmol/L 22 21 29   Calcium 8.7 - 10.3 mg/dL 9.7 9.7 7.9(L)  Total Protein 6.0 - 8.5 g/dL 7.5 6.6 1.3(Y5.4(L)  Total Bilirubin 0.0 - 1.2 mg/dL 0.3 <8.6<0.2 0.5  Alkaline Phos 39 - 117 IU/L 65 68 59  AST 0 - 40 IU/L 16 14 36  ALT 0 - 32 IU/L 13 9 39   CBC Latest Ref Rng & Units 10/26/2018 04/27/2018 09/08/2017  WBC 3.4 - 10.8 x10E3/uL 6.0 5.5 6.2  Hemoglobin 11.1 - 15.9 g/dL  11.9 10.8(L) 9.8(L)  Hematocrit 34.0 - 46.6 % 36.5 34.4 31.5(L)  Platelets 150 - 450 x10E3/uL 351 604(H) 328   Lipid Panel     Component Value Date/Time   CHOL 170 10/26/2018 1051   TRIG 53 10/26/2018 1051   HDL 66 10/26/2018 1051   CHOLHDL 2.6 10/26/2018 1051   LDLCALC 93 10/26/2018 1051   HEMOGLOBIN A1C No results found for: HGBA1C, MPG TSH Recent Labs    10/26/18 1051  TSH 5.810*    Medications and allergies   Allergies  Allergen Reactions  . Sulfa Antibiotics Rash     Current Outpatient Medications  Medication Instructions  . cetirizine (ZYRTEC) 5 mg, Oral, Daily  . diclofenac sodium (VOLTAREN) 2 g, Topical, 4 times daily  . diltiazem (CARDIZEM CD) 240 mg, Oral, Daily  . ELIQUIS 5 MG TABS tablet TAKE 1 TABLET BY MOUTH TWICE A DAY  . gabapentin (NEURONTIN) 600 mg, Oral, 3 times daily  . levothyroxine (SYNTHROID) 125 mcg, Oral, Daily before breakfast  . metoprolol tartrate (LOPRESSOR) 25 mg, Oral, 2 times daily  . Multiple Vitamin (MULTIVITAMIN WITH MINERALS) TABS 1 tablet, Oral, Daily  . omeprazole (PRILOSEC) 20 mg, Oral, Daily PRN  . Oxcarbazepine (TRILEPTAL) 300 mg, Oral, 2 times daily  . rosuvastatin (CRESTOR) 10 mg, Oral, Daily  . tamsulosin (FLOMAX) 0.4 MG CAPS capsule Daily  . TECFIDERA 240 MG CPDR TAKE 1 CAPSULE BY MOUTH TWICE A DAY   Radiology:  No results found.  Cardiac Studies:   Lexiscan myoview stress test 02/06/2016: 1. The  resting electrocardiogram demonstrated NORMAL SINUS RHYTM, normal resting conduction, no resting arrhythmias and normal rest repolarization. Stress EKG is non-diagnostic for ischemia as it a pharmacologic stress using Lexiscan. Stress symptoms included dyspnea. 2. Myocardial perfusion imaging is normal. Overall left ventricular systolic function was normal without regional wall motion abnormalities. The left ventricular ejection fraction was 53%.  Echo- 02/19/2016 1. Left ventricle cavity is normal in size. Moderate concentric hypertrophy of the left ventricle. Septum is sigmoid shape. Normal global wall motion. Calculated EF 67%. 2. Left atrial cavity is moderately dilated. 3. Moderate (Grade II) aortic regurgitation. Mild calcification of the aortic valve annulus. 4. Mild to moderate mitral regurgitation. 5. Mild tricuspid regurgitation. No evidence of pulmonary hypertension  Assessment     ICD-10-CM   1. PAF (paroxysmal atrial fibrillation) (HCC)  I48.0 EKG 12-Lead    PCV ECHOCARDIOGRAM COMPLETE   CHA2DS2-VASc Score is 3.  Yearly risk of stroke: 3.2% (F, Age, HTN).  2. Moderate aortic regurgitation  I35.1 PCV ECHOCARDIOGRAM COMPLETE  3. Moderate mitral regurgitation  I34.0 PCV ECHOCARDIOGRAM COMPLETE    EKG 04/02/2019: Sinus bradycardia at the rate of 50 bpm with first-degree AV block, left axis deviation, poor R-wave progression, probably normal variant.  No evidence of ischemia.  Normal QT interval.   EKG 08/31/2017: Sinus tachycardia and 101 bpm, left axis deviation, no evidence of ischemia.  Compared to EKG 2018, heart rate is elevated.    Recommendations:  No orders of the defined types were placed in this encounter.  Jillian Fisher  is a 73 y.o. African-American female with hyperlipidemia, HTN, multiple sclerosis, and PAF first diagnosed in October 2017. She lives  at home with her husband, daughter helping her and now essentially wheel chair and bed bound due to MS. Follows Dr.  Lesia Sago for MS.     She is presently doing well, maintains sinus rhythm and remains asymptomatic without clinical evidence of heart failure. With regard to  moderate aortic regurgitation and mitral regurgitation, aortic regurgitation murmur slightly more prominent than previous.  Again there is no clinical evidence of heart failure.  I'll repeat echocardiogram.  She has not had any bleeding diathesis, I reviewed her labs, renal function and CBC is remained stable as well.  Unless echocardiogram is markedly abnormal, I will see her back in 6 months and if she remains stable at 6 months, I will see her back on an annual basis.  Yates Decamp, MD, [facility] 04/02/2019, 2:54 PM [facility] Pager: [phone] Office: [phone]

## 2019-04-16 ENCOUNTER — Other Ambulatory Visit: Payer: Self-pay | Admitting: *Deleted

## 2019-04-16 MED ORDER — GABAPENTIN 600 MG PO TABS: 600.0000 mg | ORAL_TABLET | Freq: Three times a day (TID) | ORAL | 0 refills | Status: DC

## 2019-04-17 ENCOUNTER — Encounter: Payer: Self-pay | Admitting: *Deleted

## 2019-04-17 ENCOUNTER — Other Ambulatory Visit: Payer: Self-pay | Admitting: *Deleted

## 2019-04-17 MED ORDER — OXCARBAZEPINE 300 MG PO TABS: 300.0000 mg | ORAL_TABLET | Freq: Two times a day (BID) | ORAL | 1 refills | Status: DC

## 2019-04-17 NOTE — Progress Notes (Signed)
Sent letter about repeat labs to check sodium level, (Mailed).  Since reduction of trileptal.

## 2019-04-17 NOTE — Telephone Encounter (Signed)
I mailed letter to pt to have labs redone as per previous note.  I filled trileptal at 300mg  po bid dosing.

## 2019-04-20 ENCOUNTER — Other Ambulatory Visit: Payer: Self-pay

## 2019-04-20 DIAGNOSIS — I10 Essential (primary) hypertension: Secondary | ICD-10-CM

## 2019-04-20 MED ORDER — DILTIAZEM HCL ER COATED BEADS 240 MG PO CP24: 240.0000 mg | ORAL_CAPSULE | Freq: Every day | ORAL | 3 refills | Status: DC

## 2019-04-20 MED ORDER — METOPROLOL TARTRATE 25 MG PO TABS: 25.0000 mg | ORAL_TABLET | Freq: Two times a day (BID) | ORAL | 3 refills | Status: DC

## 2019-04-20 MED ORDER — APIXABAN 5 MG PO TABS: 5.0000 mg | ORAL_TABLET | Freq: Two times a day (BID) | ORAL | 3 refills | Status: DC

## 2019-04-30 DIAGNOSIS — G35 Multiple sclerosis: Secondary | ICD-10-CM | POA: Diagnosis not present

## 2019-04-30 DIAGNOSIS — N319 Neuromuscular dysfunction of bladder, unspecified: Secondary | ICD-10-CM | POA: Diagnosis not present

## 2019-04-30 DIAGNOSIS — G500 Trigeminal neuralgia: Secondary | ICD-10-CM | POA: Diagnosis not present

## 2019-04-30 DIAGNOSIS — R269 Unspecified abnormalities of gait and mobility: Secondary | ICD-10-CM | POA: Diagnosis not present

## 2019-04-30 DIAGNOSIS — Z5181 Encounter for therapeutic drug level monitoring: Secondary | ICD-10-CM | POA: Diagnosis not present

## 2019-05-01 DIAGNOSIS — N312 Flaccid neuropathic bladder, not elsewhere classified: Secondary | ICD-10-CM | POA: Diagnosis not present

## 2019-05-01 DIAGNOSIS — R3914 Feeling of incomplete bladder emptying: Secondary | ICD-10-CM | POA: Diagnosis not present

## 2019-05-01 DIAGNOSIS — R6889 Other general symptoms and signs: Secondary | ICD-10-CM | POA: Diagnosis not present

## 2019-05-01 DIAGNOSIS — N310 Uninhibited neuropathic bladder, not elsewhere classified: Secondary | ICD-10-CM | POA: Diagnosis not present

## 2019-05-01 DIAGNOSIS — N3000 Acute cystitis without hematuria: Secondary | ICD-10-CM | POA: Diagnosis not present

## 2019-05-08 ENCOUNTER — Ambulatory Visit: Payer: Medicare HMO | Admitting: Neurology

## 2019-05-14 IMAGING — CT CT HEAD W/O CM
3 series · 15 of 47 positions shown, 18 images · non-contrast
Comparison: Brain MRI 08/13/2017

CLINICAL DATA: Altered level of consciousness. Fever. Lethargy.
History of multiple sclerosis.

EXAM:
CT HEAD WITHOUT CONTRAST
TECHNIQUE: Contiguous axial images were obtained from the base of the skull
through the vertex without intravenous contrast.

[Series 2: head wo · axial · 0.47mm/px · z∈[+1633,+1768]mm · 9 of 33 slices shown, 12 images]
[im 3/33  brain]
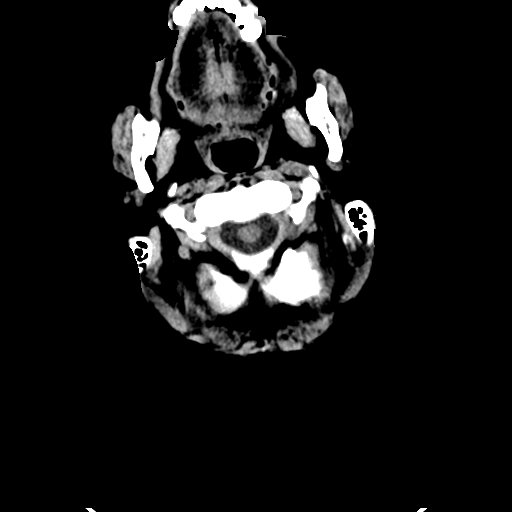
[im 3/33  bone]
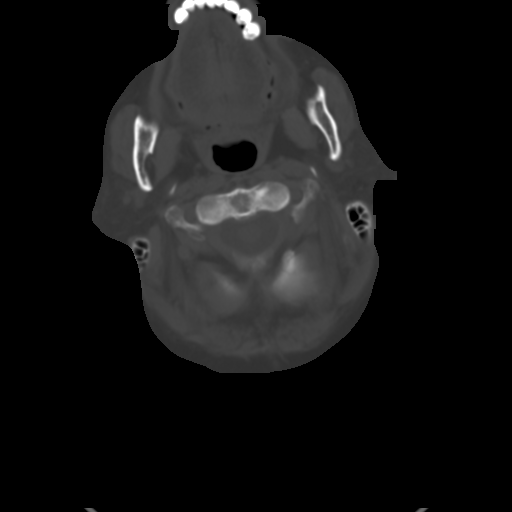
[im 6/33  brain]
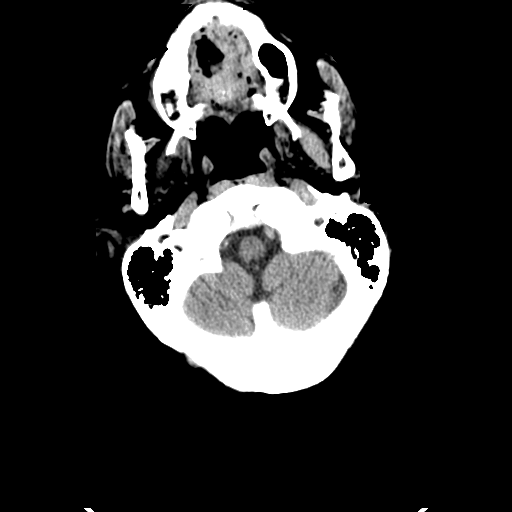
[im 9/33  brain]
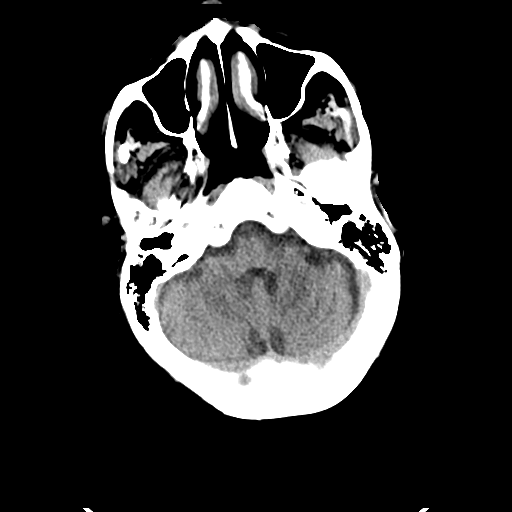
[im 13/33  brain]
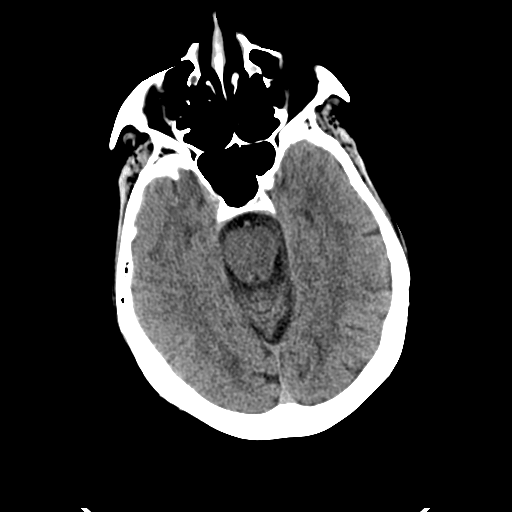
[im 17/33  brain]
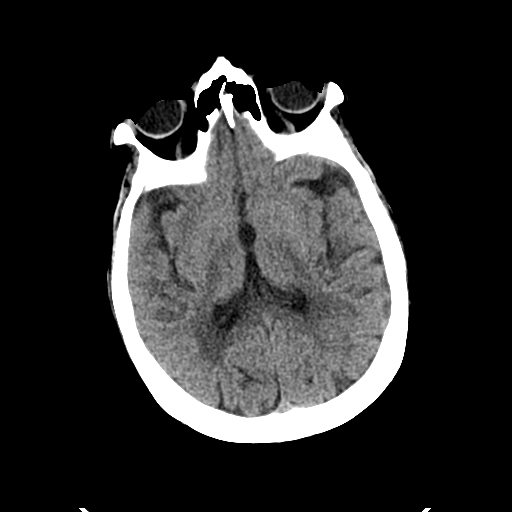
[im 17/33  bone]
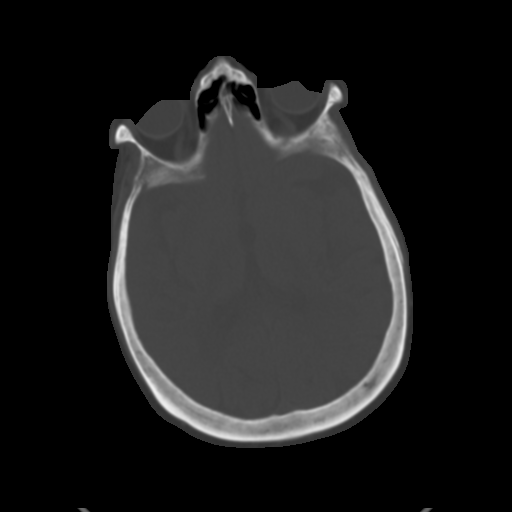
[im 20/33  brain]
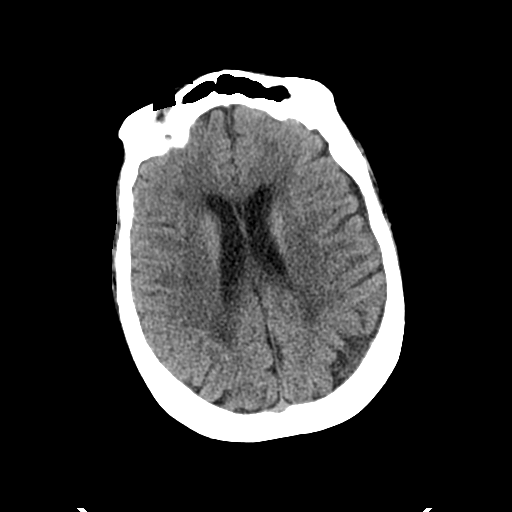
[im 24/33  brain]
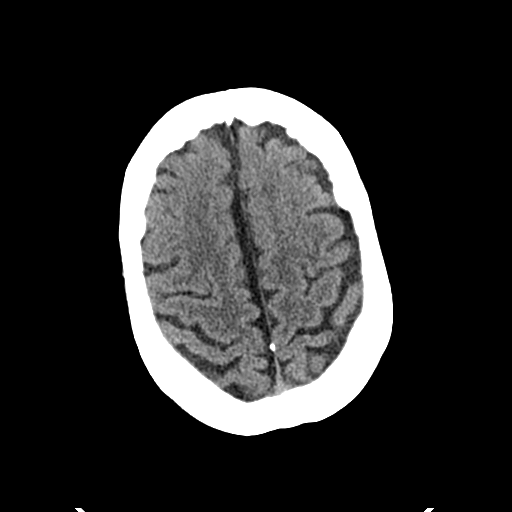
[im 27/33  brain]
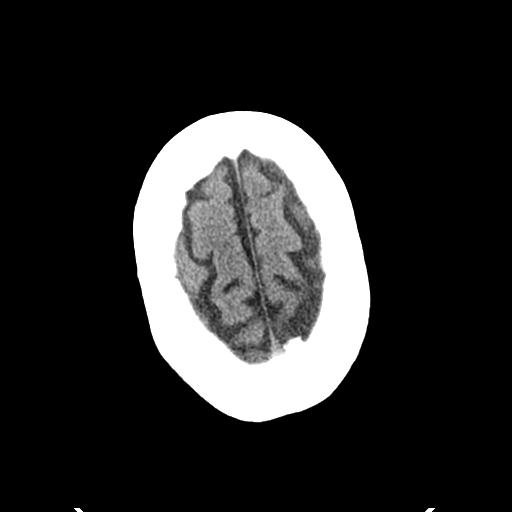
[im 30/33  brain]
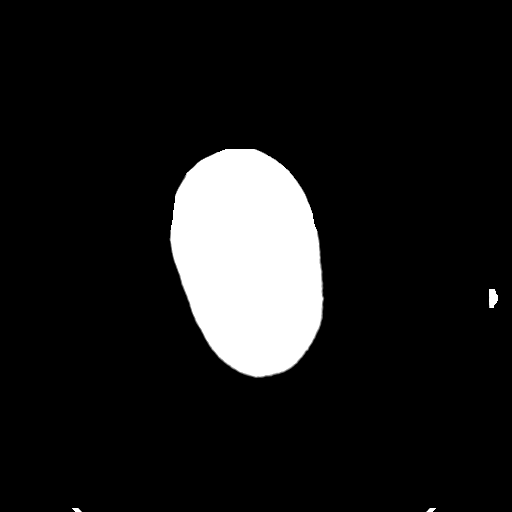
[im 30/33  bone]
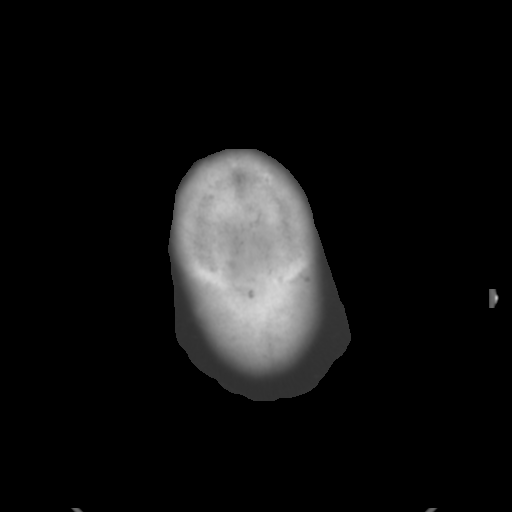

[Series 5: coronal soft tissue · coronal · 0.31mm/px · 3 of 69 slices shown]
[im 25/69  brain]
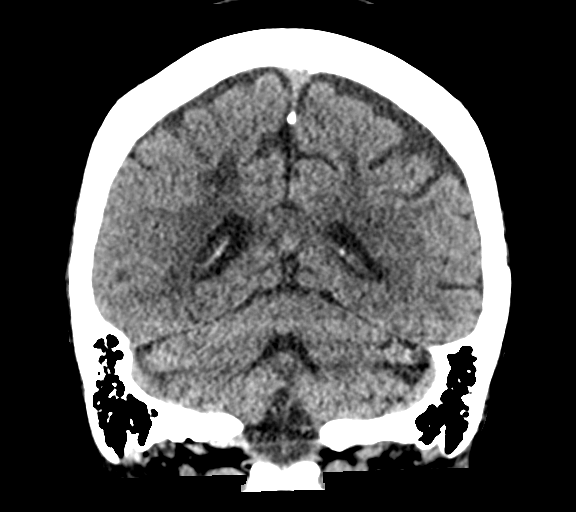
[im 32/69  brain]
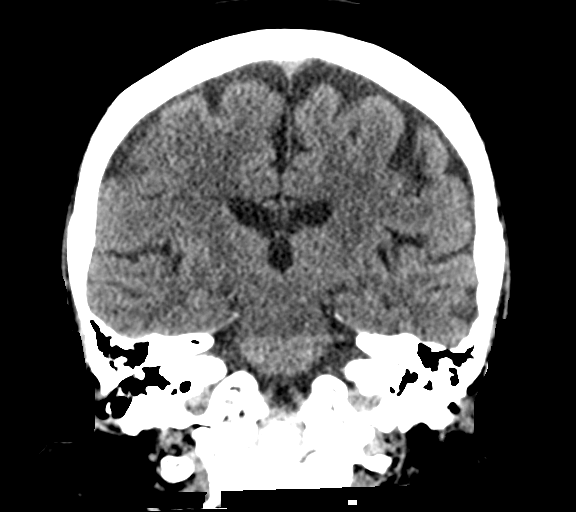
[im 39/69  brain]
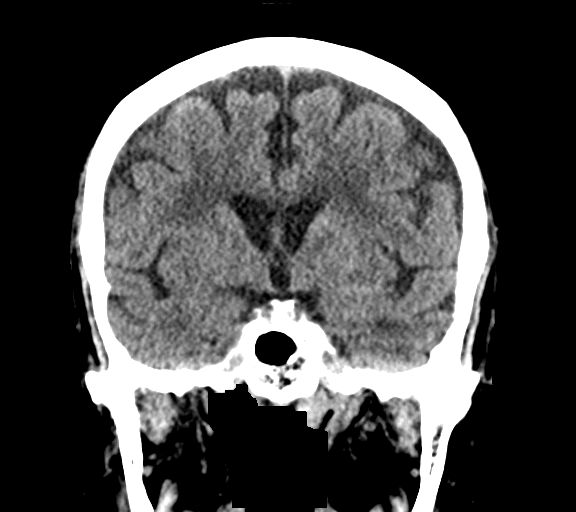

[Series 6: sagittal soft tissue · sagittal · 0.31mm/px · 3 of 60 slices shown]
[im 20/60  brain]
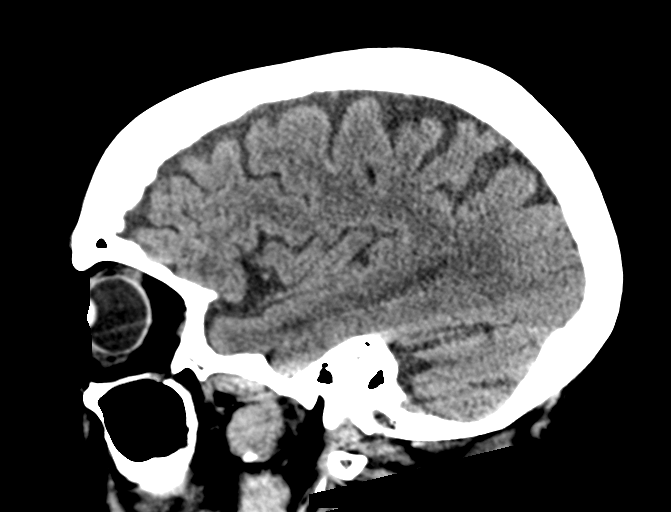
[im 30/60  brain]
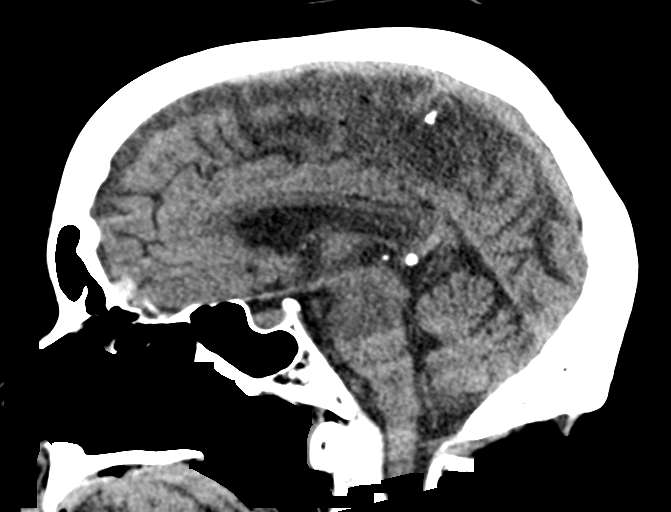
[im 40/60  brain]
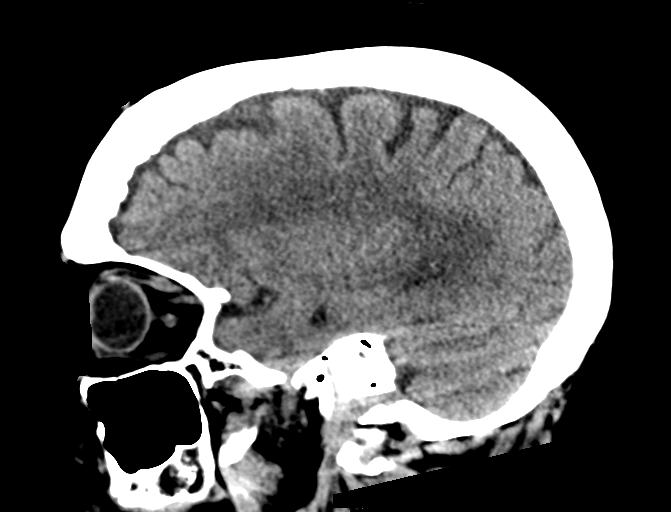

[15 of 47 positions shown; findings below may reference images not displayed]

FINDINGS: Brain: There is no evidence of acute infarct, intracranial
hemorrhage, mass, midline shift, or extra-axial fluid collection.
The ventricles and sulci are within normal limits for age. Extensive
patchy to confluent hypodensity is present throughout the cerebral
white matter.

Vascular: No hyperdense vessel.

Skull: No fracture or focal osseous lesion.

Sinuses/Orbits: Paranasal sinuses and mastoid air cells are clear.
Unremarkable orbits.

Other: None.
IMPRESSION: 1. No evidence of acute intracranial abnormality.
2. Extensive white matter disease consistent with known multiple
sclerosis.

## 2019-05-24 ENCOUNTER — Other Ambulatory Visit: Payer: Self-pay | Admitting: Cardiology

## 2019-05-24 DIAGNOSIS — I10 Essential (primary) hypertension: Secondary | ICD-10-CM

## 2019-05-28 ENCOUNTER — Telehealth: Payer: Self-pay

## 2019-05-28 NOTE — Telephone Encounter (Signed)
Handicap placard put in Dr. Anne Hahn for Monday to review for pt.

## 2019-05-30 DIAGNOSIS — R6889 Other general symptoms and signs: Secondary | ICD-10-CM | POA: Diagnosis not present

## 2019-05-30 DIAGNOSIS — N3020 Other chronic cystitis without hematuria: Secondary | ICD-10-CM | POA: Diagnosis not present

## 2019-05-30 DIAGNOSIS — R3914 Feeling of incomplete bladder emptying: Secondary | ICD-10-CM | POA: Diagnosis not present

## 2019-05-30 DIAGNOSIS — N310 Uninhibited neuropathic bladder, not elsewhere classified: Secondary | ICD-10-CM | POA: Diagnosis not present

## 2019-06-05 NOTE — Telephone Encounter (Signed)
Handicap placard has been signed and Daughter notified form is ready for pick up.  Form has been placed up front.

## 2019-07-17 ENCOUNTER — Other Ambulatory Visit: Payer: Self-pay | Admitting: Family Medicine

## 2019-07-17 DIAGNOSIS — Z1231 Encounter for screening mammogram for malignant neoplasm of breast: Secondary | ICD-10-CM

## 2019-08-05 ENCOUNTER — Other Ambulatory Visit: Payer: Self-pay | Admitting: Neurology

## 2019-08-05 DIAGNOSIS — G35 Multiple sclerosis: Secondary | ICD-10-CM

## 2019-09-04 ENCOUNTER — Other Ambulatory Visit: Payer: Medicare HMO

## 2019-09-11 ENCOUNTER — Telehealth: Payer: Self-pay | Admitting: Neurology

## 2019-09-11 ENCOUNTER — Encounter: Payer: Self-pay | Admitting: Neurology

## 2019-09-11 ENCOUNTER — Ambulatory Visit: Payer: Medicare HMO | Admitting: Neurology

## 2019-09-11 NOTE — Telephone Encounter (Signed)
This patient did not show for a revisit appointment today. 

## 2019-09-17 ENCOUNTER — Telehealth: Payer: Self-pay | Admitting: Cardiology

## 2019-09-17 ENCOUNTER — Ambulatory Visit: Payer: Medicare HMO

## 2019-09-17 ENCOUNTER — Other Ambulatory Visit: Payer: Self-pay

## 2019-09-17 DIAGNOSIS — R6889 Other general symptoms and signs: Secondary | ICD-10-CM | POA: Diagnosis not present

## 2019-09-17 NOTE — Telephone Encounter (Signed)
Patient is now wheel chair bound and unable to get up on the chair or exam table for echo. Looks frail. Will cancel echo and Postpone her visit to 6 months.  Yates Decamp, MD, [facility] 09/17/2019, 2:30 PM [facility] [place] Office: [phone]

## 2019-09-24 ENCOUNTER — Ambulatory Visit: Payer: Medicare HMO | Admitting: Cardiology

## 2019-10-03 ENCOUNTER — Telehealth: Payer: Self-pay | Admitting: Neurology

## 2019-10-03 MED ORDER — GABAPENTIN 600 MG PO TABS: 600.0000 mg | ORAL_TABLET | Freq: Four times a day (QID) | ORAL | 0 refills | Status: DC

## 2019-10-03 NOTE — Telephone Encounter (Signed)
I called her daughter. Indicates more TN pain over the weekend, is on right side, taking gabapentin 600 mg 3 times daily, Trileptal 300 mg twice daily. She will try gabapentin 600 mg 4 times daily. Has had low sodium levels, won't change the Trileptal. She missed revisit appointment with Dr. Anne Hahn June 8. Will try to get her in sooner to see me or Dr. Anne Hahn, seeing Dr. Anne Hahn in November, was last seen in July 2020. If pain is still not controlled, may need to add on Keppra.

## 2019-10-03 NOTE — Telephone Encounter (Signed)
Pt's daughter [name] called needing to speak to the RN about the pt's face pain that has started up again. She states the medication helps a little bit but the pain comes back quick, mainly in the morning. Please advise.

## 2019-10-03 NOTE — Telephone Encounter (Signed)
Spoke to pts daughter and she is having a flare of TN.  Taking gabapentin 600po tid, and oxcarbamazepine 300mg  po bid.  Seems like th frequency getting worse.  Please advise.  Next appt 11-211 with Dr. 11-30-1985 scheduled.  Watching sodium with oxcarbamazepine.

## 2019-10-04 NOTE — Telephone Encounter (Signed)
Pt has been added to wait list.

## 2019-10-16 ENCOUNTER — Other Ambulatory Visit: Payer: Self-pay | Admitting: Neurology

## 2019-10-16 NOTE — Telephone Encounter (Signed)
Jillian Sago, Jillian Fisher has openings on 10/18/2019.  I called pt's daughter, Jillian Fisher, per DPR, to offer pt one of these appts. No answer, left a message asking her to call us back. If pt calls back please offer her any remaining appts with Jillian Sago, Jillian Fisher if still available.

## 2019-10-26 DIAGNOSIS — Z23 Encounter for immunization: Secondary | ICD-10-CM | POA: Diagnosis not present

## 2019-10-26 DIAGNOSIS — E039 Hypothyroidism, unspecified: Secondary | ICD-10-CM | POA: Diagnosis not present

## 2019-10-26 DIAGNOSIS — Z1211 Encounter for screening for malignant neoplasm of colon: Secondary | ICD-10-CM | POA: Diagnosis not present

## 2019-10-26 DIAGNOSIS — I4891 Unspecified atrial fibrillation: Secondary | ICD-10-CM | POA: Diagnosis not present

## 2019-10-26 DIAGNOSIS — R6889 Other general symptoms and signs: Secondary | ICD-10-CM | POA: Diagnosis not present

## 2019-10-26 DIAGNOSIS — E7800 Pure hypercholesterolemia, unspecified: Secondary | ICD-10-CM | POA: Diagnosis not present

## 2019-10-26 DIAGNOSIS — M8589 Other specified disorders of bone density and structure, multiple sites: Secondary | ICD-10-CM | POA: Diagnosis not present

## 2019-10-26 DIAGNOSIS — Z0000 Encounter for general adult medical examination without abnormal findings: Secondary | ICD-10-CM | POA: Diagnosis not present

## 2019-10-26 DIAGNOSIS — G35 Multiple sclerosis: Secondary | ICD-10-CM | POA: Diagnosis not present

## 2019-10-28 ENCOUNTER — Other Ambulatory Visit: Payer: Self-pay | Admitting: Cardiology

## 2019-11-15 DIAGNOSIS — Z1211 Encounter for screening for malignant neoplasm of colon: Secondary | ICD-10-CM | POA: Diagnosis not present

## 2019-11-21 DIAGNOSIS — R6889 Other general symptoms and signs: Secondary | ICD-10-CM | POA: Diagnosis not present

## 2019-11-21 DIAGNOSIS — H2513 Age-related nuclear cataract, bilateral: Secondary | ICD-10-CM | POA: Diagnosis not present

## 2019-11-21 DIAGNOSIS — H18513 Endothelial corneal dystrophy, bilateral: Secondary | ICD-10-CM | POA: Diagnosis not present

## 2019-11-22 DIAGNOSIS — R6889 Other general symptoms and signs: Secondary | ICD-10-CM | POA: Diagnosis not present

## 2019-11-22 DIAGNOSIS — R310 Gross hematuria: Secondary | ICD-10-CM | POA: Diagnosis not present

## 2019-11-22 DIAGNOSIS — N310 Uninhibited neuropathic bladder, not elsewhere classified: Secondary | ICD-10-CM | POA: Diagnosis not present

## 2020-01-09 ENCOUNTER — Ambulatory Visit: Payer: Self-pay | Admitting: Neurology

## 2020-01-17 ENCOUNTER — Other Ambulatory Visit: Payer: Self-pay

## 2020-01-17 MED ORDER — GABAPENTIN 600 MG PO TABS: 600.0000 mg | ORAL_TABLET | Freq: Four times a day (QID) | ORAL | 0 refills | Status: DC

## 2020-01-23 ENCOUNTER — Ambulatory Visit: Payer: Self-pay | Admitting: Neurology

## 2020-01-23 NOTE — Progress Notes (Deleted)
PATIENT: Jillian Fisher DOB: 05-04-1945  REASON FOR VISIT: follow up HISTORY FROM: patient  HISTORY OF PRESENT ILLNESS: Today 01/23/20 Jillian Fisher is a 74 year old female with history of multiple sclerosis with brain and spinal cord lesions, on Tecfidera.  She has history of trigeminal neuralgia, taking gabapentin and Trileptal.  Has been on Tecfidera since summer 2019, had severe MS attack in April 2019.  She is no longer able to stand or ambulate.  She is in a wheelchair, has a Nurse, adult she uses at home.  She lives with her husband, her daughter is nearby.  Has neurogenic bladder.  When last seen, Trileptal was decreased 300 mg twice a day due to sodium low 131, gabapentin had to be increased 600 mg 4 times a day. HISTORY 10/26/2018 SS: Jillian Fisher is a 74 year old female with history of multiple sclerosis with brain and spinal cord lesions and trigeminal neuralgia.  She remains on Tecfidera for MS.  She has been on Tecfidera since summer 2019, she had a severe MS attack in April 2019.  She is no longer able to stand or ambulate. For trigeminal neuralgia she remains on gabapentin 300 mg capsule, 1 capsule 4 times a day, Trileptal 300 mg, 1 tablet 3 times daily.  She reports her trigeminal neuralgia pain has been under good control.  She remains on Tecfidera, is tolerating medication well.  Her daughter reports over the last month they have noticed more movement in her right leg, greater strength.  She is not able to stand or ambulate.  She remains in a wheelchair, has a Nurse, adult she uses in the home.  She lives with her husband, and her daughter is nearby and helps out daily.  Overall, she feels her condition is stable, possible slight improvement with increasing strength in her right leg.  She does have history of neurogenic bladder.  Her daughter indicates she is aware when she has to urinate, but she uses an incontinence brief.  She is planning to have cataract surgery on both eyes in the  next few weeks.  She is sleeping well.  She has a good appetite, is eating much more and is no longer having to take Ensure.  They had a virtual visit with her primary, Dr. Clovis Riley yesterday.  They have requested we obtain a TSH and lipid panel.  The patient indicates her overall health has been well since last visit.  She presents today for follow-up accompanied by her daughter.   REVIEW OF SYSTEMS: Out of a complete 14 system review of symptoms, the patient complains only of the following symptoms, and all other reviewed systems are negative.  ALLERGIES: Allergies  Allergen Reactions  . Sulfa Antibiotics Rash    HOME MEDICATIONS: Outpatient Medications Prior to Visit  Medication Sig Dispense Refill  . cetirizine (ZYRTEC) 5 MG tablet Take 5 mg by mouth daily.    . diclofenac sodium (VOLTAREN) 1 % GEL Apply 2 g topically 4 (four) times daily. (Patient taking differently: Apply 2 g topically as needed. )    . diltiazem (CARDIZEM CD) 240 MG 24 hr capsule Take 1 capsule (240 mg total) by mouth daily. 90 capsule 3  . ELIQUIS 5 MG TABS tablet TAKE 1 TABLET BY MOUTH TWICE A DAY 60 tablet 2  . gabapentin (NEURONTIN) 600 MG tablet Take 1 tablet (600 mg total) by mouth in the morning, at noon, in the evening, and at bedtime. 120 tablet 0  . levothyroxine (SYNTHROID, LEVOTHROID) 125 MCG  tablet Take 125 mcg by mouth daily before breakfast.   3  . metoprolol tartrate (LOPRESSOR) 25 MG tablet TAKE 1 TABLET BY MOUTH TWICE A DAY 180 tablet 1  . Multiple Vitamin (MULTIVITAMIN WITH MINERALS) TABS Take 1 tablet by mouth daily.    Marland Kitchen omeprazole (PRILOSEC) 20 MG capsule Take 20 mg by mouth daily as needed (reflux).     . Oxcarbazepine (TRILEPTAL) 300 MG tablet TAKE 1 TABLET (300 MG TOTAL) BY MOUTH 2 (TWO) TIMES DAILY. 180 tablet 0  . rosuvastatin (CRESTOR) 20 MG tablet Take 10 mg by mouth daily.     . tamsulosin (FLOMAX) 0.4 MG CAPS capsule daily.    . TECFIDERA 240 MG CPDR TAKE 1 CAPSULE BY MOUTH TWICE A DAY  60 capsule 5   No facility-administered medications prior to visit.    PAST MEDICAL HISTORY: Past Medical History:  Diagnosis Date  . Abnormality of gait 01/25/2013  . Arthritis   . GERD (gastroesophageal reflux disease)   . Hypercholesteremia   . Hypothyroidism   . Multiple sclerosis (HCC)   . Neurogenic bladder 01/25/2013  . Trigeminal neuralgia 07/07/2017   Right V3    PAST SURGICAL HISTORY: Past Surgical History:  Procedure Laterality Date  . ABDOMINAL HYSTERECTOMY      FAMILY HISTORY: Family History  Problem Relation Age of Onset  . Heart attack Mother   . Multiple sclerosis Sister   . Heart disease Sister   . Diabetes Other     SOCIAL HISTORY: Social History   Socioeconomic History  . Marital status: Married    Spouse name: [name]  . Number of children: 2  . Years of education: 8 th  . Highest education level: Not on file  Occupational History  . Occupation: retired  Tobacco Use  . Smoking status: Former Smoker    Packs/day: 0.25    Years: 1.00    Pack years: 0.25    Types: Cigarettes    Quit date: 08/10/1988    Years since quitting: 31.4  . Smokeless tobacco: Never Used  . Tobacco comment: quit 30-40 years ago (documented in 2013)  Vaping Use  . Vaping Use: Never used  Substance and Sexual Activity  . Alcohol use: No    Alcohol/week: 0.0 standard drinks  . Drug use: No  . Sexual activity: Not on file  Other Topics Concern  . Not on file  Social History Narrative   Patient lives at home with her husband.   Retired.   Education 8 th grade   Right handed.   Caffeine none.   Social Determinants of Health   Financial Resource Strain:   . Difficulty of Paying Living Expenses: Not on file  Food Insecurity:   . Worried About Programme researcher, broadcasting/film/video in the Last Year: Not on file  . Ran Out of Food in the Last Year: Not on file  Transportation Needs:   . Lack of Transportation (Medical): Not on file  . Lack of Transportation (Non-Medical): Not on  file  Physical Activity:   . Days of Exercise per Week: Not on file  . Minutes of Exercise per Session: Not on file  Stress:   . Feeling of Stress : Not on file  Social Connections:   . Frequency of Communication with Friends and Family: Not on file  . Frequency of Social Gatherings with Friends and Family: Not on file  . Attends Religious Services: Not on file  . Active Member of Clubs or Organizations: Not on  file  . Attends Banker Meetings: Not on file  . Marital Status: Not on file  Intimate Partner Violence:   . Fear of Current or Ex-Partner: Not on file  . Emotionally Abused: Not on file  . Physically Abused: Not on file  . Sexually Abused: Not on file      PHYSICAL EXAM  There were no vitals filed for this visit. There is no height or weight on file to calculate BMI.  Generalized: Well developed, in no acute distress   Neurological examination  Mentation: Alert oriented to time, place, history taking. Follows all commands speech and language fluent Cranial nerve II-XII: Pupils were equal round reactive to light. Extraocular movements were full, visual field were full on confrontational test. Facial sensation and strength were normal. Uvula tongue midline. Head turning and shoulder shrug  were normal and symmetric. Motor: The motor testing reveals 5 over 5 strength of all 4 extremities. Good symmetric motor tone is noted throughout.  Sensory: Sensory testing is intact to soft touch on all 4 extremities. No evidence of extinction is noted.  Coordination: Cerebellar testing reveals good finger-nose-finger and heel-to-shin bilaterally.  Gait and station: Gait is normal. Tandem gait is normal. Romberg is negative. No drift is seen.  Reflexes: Deep tendon reflexes are symmetric and normal bilaterally.   DIAGNOSTIC DATA (LABS, IMAGING, TESTING) - I reviewed patient records, labs, notes, testing and imaging myself where available.  Lab Results  Component Value  Date   WBC 6.0 10/26/2018   HGB 11.9 10/26/2018   HCT 36.5 10/26/2018   MCV 82 10/26/2018   PLT 351 10/26/2018      Component Value Date/Time   NA 131 (L) 10/26/2018 1051   K 4.6 10/26/2018 1051   CL 95 (L) 10/26/2018 1051   CO2 22 10/26/2018 1051   GLUCOSE 120 (H) 10/26/2018 1051   GLUCOSE 127 (H) 09/08/2017 0822   BUN 13 10/26/2018 1051   CREATININE 0.48 (L) 10/26/2018 1051   CALCIUM 9.7 10/26/2018 1051   PROT 7.5 10/26/2018 1051   ALBUMIN 4.2 10/26/2018 1051   AST 16 10/26/2018 1051   ALT 13 10/26/2018 1051   ALKPHOS 65 10/26/2018 1051   BILITOT 0.3 10/26/2018 1051   GFRNONAA 98 10/26/2018 1051   GFRAA 113 10/26/2018 1051   Lab Results  Component Value Date   CHOL 170 10/26/2018   HDL 66 10/26/2018   LDLCALC 93 10/26/2018   TRIG 53 10/26/2018   CHOLHDL 2.6 10/26/2018   No results found for: HGBA1C No results found for: VITAMINB12 Lab Results  Component Value Date   TSH 5.810 (H) 10/26/2018      ASSESSMENT AND PLAN 74 y.o. year old female  has a past medical history of Abnormality of gait (01/25/2013), Arthritis, GERD (gastroesophageal reflux disease), Hypercholesteremia, Hypothyroidism, Multiple sclerosis (HCC), Neurogenic bladder (01/25/2013), and Trigeminal neuralgia (07/07/2017). here with ***   I spent 15 minutes with the patient. 50% of this time was spent   Margie Ege, [place], DNP 01/23/2020, 5:35 AM [facility] [address] [place], [place] [address] [phone]

## 2020-02-07 DIAGNOSIS — Z01818 Encounter for other preprocedural examination: Secondary | ICD-10-CM | POA: Diagnosis not present

## 2020-02-07 DIAGNOSIS — R6889 Other general symptoms and signs: Secondary | ICD-10-CM | POA: Diagnosis not present

## 2020-02-07 DIAGNOSIS — H2513 Age-related nuclear cataract, bilateral: Secondary | ICD-10-CM | POA: Diagnosis not present

## 2020-02-08 ENCOUNTER — Other Ambulatory Visit: Payer: Self-pay | Admitting: Neurology

## 2020-02-08 ENCOUNTER — Other Ambulatory Visit: Payer: Self-pay | Admitting: Cardiology

## 2020-02-08 DIAGNOSIS — I10 Essential (primary) hypertension: Secondary | ICD-10-CM

## 2020-02-12 ENCOUNTER — Telehealth: Payer: Self-pay | Admitting: Emergency Medicine

## 2020-02-12 ENCOUNTER — Other Ambulatory Visit: Payer: Self-pay | Admitting: Emergency Medicine

## 2020-02-12 NOTE — Telephone Encounter (Signed)
Called patient to ask if she still has oxcarbazepine, she asked me to call her daughter Jillian Fisher.  Called [place] and spoke to her on the phone.  She stated that her mother did not need refills,however, she was just requesting to have future refills moved to the [employer].  Patient has an appointment with Dr. Anne Hahn 11/11.  She has cancelled/missed the last 4 visits, dated 10/20, 10/6, 6/8 & 2/2.  Jillian Fisher stated that she would talk to Dr. Anne Hahn regarding refills at that appointment.  Jillian Fisher was informed that her mother needed to keep her appointment on 11/11 for future refills of her medication.

## 2020-02-14 ENCOUNTER — Encounter: Payer: Self-pay | Admitting: Neurology

## 2020-02-14 ENCOUNTER — Ambulatory Visit: Payer: Medicare HMO | Admitting: Neurology

## 2020-02-14 ENCOUNTER — Other Ambulatory Visit: Payer: Self-pay

## 2020-02-14 VITALS — BP 138/57 | HR 50 | Ht 65.0 in | Wt 166.0 lb

## 2020-02-14 DIAGNOSIS — Z5181 Encounter for therapeutic drug level monitoring: Secondary | ICD-10-CM

## 2020-02-14 DIAGNOSIS — G35 Multiple sclerosis: Secondary | ICD-10-CM | POA: Diagnosis not present

## 2020-02-14 DIAGNOSIS — R6889 Other general symptoms and signs: Secondary | ICD-10-CM | POA: Diagnosis not present

## 2020-02-14 DIAGNOSIS — R269 Unspecified abnormalities of gait and mobility: Secondary | ICD-10-CM

## 2020-02-14 MED ORDER — GABAPENTIN 600 MG PO TABS
600.0000 mg | ORAL_TABLET | Freq: Four times a day (QID) | ORAL | 3 refills | Status: DC
Start: 2020-02-14 — End: 2020-08-13

## 2020-02-14 MED ORDER — OXCARBAZEPINE 300 MG PO TABS
300.0000 mg | ORAL_TABLET | Freq: Two times a day (BID) | ORAL | 3 refills | Status: DC
Start: 2020-02-14 — End: 2020-02-18

## 2020-02-14 NOTE — Progress Notes (Signed)
Reason for visit: Multiple sclerosis  Jillian Fisher is an 74 y.o. female  History of present illness:  Jillian Fisher is a 74 year old right-handed black female with a history of multiple sclerosis associated with a paraparesis.  The patient developed relatively sudden onset of paraparesis in April 2019, she has been nonambulatory since that time.  She comes in with her family today.  She has not had any change since last seen over a year ago in July 2020.  She has not had any issues controlling the bowels or the bladder currently.  She has significant weakness in the lower extremities, right greater than left.  She has fairly good use of the arms, but the daughter noted some slight tremor in the left hand last week, but this has not been much of an issue recently.  She has trigeminal neuralgia affecting the right face, she is on gabapentin and Trileptal for this.  She may have a bout of pain every couple weeks, sometimes appears to come on just prior to her next dose of medication.  She is able to eat and swallow, but when the pain hits she has difficulty chewing.  The patient is sleeping well.  She has not had any skin breakdown problems, she has not had any falls.  The family does have a Hoyer lift at home that is helpful.  She returns for further evaluation.  Last MRI evaluation of the brain and spinal cord was 2 years ago.  Past Medical History:  Diagnosis Date  . Abnormality of gait 01/25/2013  . Arthritis   . GERD (gastroesophageal reflux disease)   . Hypercholesteremia   . Hypothyroidism   . Multiple sclerosis (HCC)   . Neurogenic bladder 01/25/2013  . Trigeminal neuralgia 07/07/2017   Right V3    Past Surgical History:  Procedure Laterality Date  . ABDOMINAL HYSTERECTOMY      Family History  Problem Relation Age of Onset  . Heart attack Mother   . Multiple sclerosis Sister   . Heart disease Sister   . Diabetes Other     Social history:  reports that she quit smoking  about 31 years ago. Her smoking use included cigarettes. She has a 0.25 pack-year smoking history. She has never used smokeless tobacco. She reports that she does not drink alcohol and does not use drugs.    Allergies  Allergen Reactions  . Sulfa Antibiotics Rash    Medications:  Prior to Admission medications   Medication Sig Start Date End Date Taking? Authorizing Provider  cetirizine (ZYRTEC) 5 MG tablet Take 5 mg by mouth daily.   Yes [provider]  diclofenac sodium (VOLTAREN) 1 % GEL Apply 2 g topically 4 (four) times daily. Patient taking differently: Apply 2 g topically as needed.  09/07/17  Yes Sheikh, Omair Latif, DO  diltiazem (CARDIZEM CD) 240 MG 24 hr capsule TAKE 1 CAPSULE EVERY DAY 02/08/20  Yes Yates Decamp, MD  ELIQUIS 5 MG TABS tablet TAKE 1 TABLET BY MOUTH TWICE A DAY 10/29/19  Yes Yates Decamp, MD  gabapentin (NEURONTIN) 600 MG tablet TAKE 1 TABLET (600 MG TOTAL) BY MOUTH IN THE MORNING, AT NOON, IN THE EVENING, AND AT BEDTIME. 02/08/20  Yes Glean Salvo, NP  levothyroxine (SYNTHROID, LEVOTHROID) 125 MCG tablet Take 125 mcg by mouth daily before breakfast.  12/11/14  Yes [provider]  metoprolol tartrate (LOPRESSOR) 25 MG tablet TAKE 1 TABLET BY MOUTH TWICE A DAY 05/25/19  Yes Yates Decamp,  MD  Multiple Vitamin (MULTIVITAMIN WITH MINERALS) TABS Take 1 tablet by mouth daily.   Yes [provider]  omeprazole (PRILOSEC) 20 MG capsule Take 20 mg by mouth daily as needed (reflux).    Yes [provider]  Oxcarbazepine (TRILEPTAL) 300 MG tablet TAKE 1 TABLET (300 MG TOTAL) BY MOUTH 2 (TWO) TIMES DAILY. 10/16/19  Yes Glean Salvo, NP  rosuvastatin (CRESTOR) 20 MG tablet Take 10 mg by mouth daily.    Yes [provider]  tamsulosin (FLOMAX) 0.4 MG CAPS capsule daily. 02/09/19  Yes [provider]  TECFIDERA 240 MG CPDR TAKE 1 CAPSULE BY MOUTH TWICE A DAY 08/06/19  Yes Glean Salvo, NP    ROS:  Out of a complete 14 system review  of symptoms, the patient complains only of the following symptoms, and all other reviewed systems are negative.  Weakness in the legs Face pain Tremor  Blood pressure (!) 138/57, pulse (!) 50, height 5\' 5"  (1.651 m), weight 166 lb (75.3 kg).  Physical Exam  General: The patient is alert and cooperative at the time of the examination.  Skin: No significant peripheral edema is noted.   Neurologic Exam  Mental status: The patient is alert and oriented x 3 at the time of the examination. The patient has apparent normal recent and remote memory, with an apparently normal attention span and concentration ability.   Cranial nerves: Facial symmetry is present. Speech is normal, no aphasia or dysarthria is noted. Extraocular movements are full. Visual fields are full.  Pupils are equal, round, and reactive to light.  Discs are flat bilaterally.  Motor: The patient has good strength in the upper extremities.  With the lower extremities, the patient has significant diffuse weakness of both legs, slightly worse on the right.  The patient does have some ability to extend at the knee on the left and to dorsiflex with the left foot, not much on the right.  Significant weakness with hip flexion is seen bilaterally.  Sensory examination: Soft touch sensation is symmetric on the face, arms, and legs.  Coordination: The patient has good finger-nose-finger bilaterally.  The patient is not able to perform heel-to-shin on either side.  Gait and station: The patient is wheelchair-bound, she cannot be ambulated.  Reflexes: Deep tendon reflexes are symmetric, but are depressed throughout.   Assessment/Plan:  1.  Multiple sclerosis, paraparesis  2.  Right trigeminal neuralgia  The patient will be sent for blood work today, if possible, may be able go up on the Trileptal dose some to help the neuralgia pain.  The patient will remain on Tecfidera, she seems to be clinically stable on this medication.  At  some point next year we will check MRI of the brain and cervical spine.  The patient will follow up in 6 months.  MD 02/14/2020 9:16 AM  [facility] [address] [place], [place]  Phone [phone] Fax [phone]

## 2020-02-18 ENCOUNTER — Telehealth: Payer: Self-pay | Admitting: Neurology

## 2020-02-18 LAB — COMPREHENSIVE METABOLIC PANEL
ALT: 16 IU/L (ref 0–32)
AST: 19 IU/L (ref 0–40)
Albumin/Globulin Ratio: 1.4 (ref 1.2–2.2)
Albumin: 4.3 g/dL (ref 3.7–4.7)
Alkaline Phosphatase: 61 IU/L (ref 44–121)
BUN/Creatinine Ratio: 14 (ref 12–28)
BUN: 10 mg/dL (ref 8–27)
Bilirubin Total: 0.3 mg/dL (ref 0.0–1.2)
CO2: 25 mmol/L (ref 20–29)
Calcium: 9.7 mg/dL (ref 8.7–10.3)
Chloride: 104 mmol/L (ref 96–106)
Creatinine, Ser: 0.73 mg/dL (ref 0.57–1.00)
GFR calc Af Amer: 94 mL/min/{1.73_m2} (ref >59)
GFR calc non Af Amer: 82 mL/min/{1.73_m2} (ref >59)
Globulin, Total: 3.1 g/dL (ref 1.5–4.5)
Glucose: 115 mg/dL — ABNORMAL HIGH (ref 65–99)
Potassium: 4.4 mmol/L (ref 3.5–5.2)
Sodium: 143 mmol/L (ref 134–144)
Total Protein: 7.4 g/dL (ref 6.0–8.5)

## 2020-02-18 LAB — CBC WITH DIFFERENTIAL/PLATELET
Basophils Absolute: 0.1 10*3/uL (ref 0.0–0.2)
Basos: 1 %
EOS (ABSOLUTE): 0.2 10*3/uL (ref 0.0–0.4)
Eos: 4 %
Hematocrit: 37.2 % (ref 34.0–46.6)
Hemoglobin: 12.8 g/dL (ref 11.1–15.9)
Immature Grans (Abs): 0 10*3/uL (ref 0.0–0.1)
Immature Granulocytes: 0 %
Lymphocytes Absolute: 1.3 10*3/uL (ref 0.7–3.1)
Lymphs: 26 %
MCH: 32.1 pg (ref 26.6–33.0)
MCHC: 34.4 g/dL (ref 31.5–35.7)
MCV: 93 fL (ref 79–97)
Monocytes Absolute: 0.6 10*3/uL (ref 0.1–0.9)
Monocytes: 11 %
Neutrophils Absolute: 2.9 10*3/uL (ref 1.4–7.0)
Neutrophils: 58 %
Platelets: 240 10*3/uL (ref 150–450)
RBC: 3.99 x10E6/uL (ref 3.77–5.28)
RDW: 12.4 % (ref 11.7–15.4)
WBC: 5 10*3/uL (ref 3.4–10.8)

## 2020-02-18 LAB — 10-HYDROXYCARBAZEPINE: Oxcarbazepine SerPl-Mcnc: 15 ug/mL (ref 10–35)

## 2020-02-18 LAB — NEUROMYELITIS OPTICA AUTOAB, IGG: NMO IgG Autoantibodies: <1.5 U/mL (ref 0.0–3.0)

## 2020-02-18 MED ORDER — OXCARBAZEPINE 300 MG PO TABS
300.0000 mg | ORAL_TABLET | Freq: Three times a day (TID) | ORAL | 1 refills | Status: DC
Start: 2020-02-18 — End: 2020-08-13

## 2020-02-18 NOTE — Telephone Encounter (Signed)
I called the patient.  The blood work was unremarkable, the Trileptal level was 15, we do have room to increase the dose to help control her trigeminal neuralgia pain.  The patient indicates that she does not wish to go up on the dose.  I will send in a prescription for Trileptal to be taken 300 mg 3 times daily.

## 2020-02-21 DIAGNOSIS — H5789 Other specified disorders of eye and adnexa: Secondary | ICD-10-CM | POA: Diagnosis not present

## 2020-02-21 DIAGNOSIS — R6889 Other general symptoms and signs: Secondary | ICD-10-CM | POA: Diagnosis not present

## 2020-02-21 DIAGNOSIS — H2512 Age-related nuclear cataract, left eye: Secondary | ICD-10-CM | POA: Diagnosis not present

## 2020-02-21 DIAGNOSIS — H268 Other specified cataract: Secondary | ICD-10-CM | POA: Diagnosis not present

## 2020-02-22 DIAGNOSIS — R6889 Other general symptoms and signs: Secondary | ICD-10-CM | POA: Diagnosis not present

## 2020-02-22 DIAGNOSIS — Z961 Presence of intraocular lens: Secondary | ICD-10-CM | POA: Diagnosis not present

## 2020-02-22 DIAGNOSIS — H18513 Endothelial corneal dystrophy, bilateral: Secondary | ICD-10-CM | POA: Diagnosis not present

## 2020-02-22 DIAGNOSIS — Z48810 Encounter for surgical aftercare following surgery on the sense organs: Secondary | ICD-10-CM | POA: Diagnosis not present

## 2020-02-22 DIAGNOSIS — H2511 Age-related nuclear cataract, right eye: Secondary | ICD-10-CM | POA: Diagnosis not present

## 2020-02-22 DIAGNOSIS — Z882 Allergy status to sulfonamides status: Secondary | ICD-10-CM | POA: Diagnosis not present

## 2020-03-03 ENCOUNTER — Other Ambulatory Visit: Payer: Self-pay | Admitting: Neurology

## 2020-03-03 DIAGNOSIS — G35 Multiple sclerosis: Secondary | ICD-10-CM

## 2020-03-05 DIAGNOSIS — R6889 Other general symptoms and signs: Secondary | ICD-10-CM | POA: Diagnosis not present

## 2020-03-09 ENCOUNTER — Other Ambulatory Visit: Payer: Self-pay | Admitting: Cardiology

## 2020-03-21 ENCOUNTER — Ambulatory Visit: Payer: Medicare HMO | Admitting: Cardiology

## 2020-03-27 DIAGNOSIS — N130 Hydronephrosis with ureteropelvic junction obstruction: Secondary | ICD-10-CM | POA: Diagnosis not present

## 2020-03-27 DIAGNOSIS — R6889 Other general symptoms and signs: Secondary | ICD-10-CM | POA: Diagnosis not present

## 2020-03-27 DIAGNOSIS — N3289 Other specified disorders of bladder: Secondary | ICD-10-CM | POA: Diagnosis not present

## 2020-03-27 DIAGNOSIS — R8271 Bacteriuria: Secondary | ICD-10-CM | POA: Diagnosis not present

## 2020-05-05 ENCOUNTER — Ambulatory Visit: Payer: Self-pay | Admitting: Neurology

## 2020-05-05 DIAGNOSIS — R6889 Other general symptoms and signs: Secondary | ICD-10-CM | POA: Diagnosis not present

## 2020-05-07 DIAGNOSIS — R6889 Other general symptoms and signs: Secondary | ICD-10-CM | POA: Diagnosis not present

## 2020-05-13 ENCOUNTER — Other Ambulatory Visit: Payer: Self-pay | Admitting: Cardiology

## 2020-05-13 DIAGNOSIS — I10 Essential (primary) hypertension: Secondary | ICD-10-CM

## 2020-06-02 DIAGNOSIS — H18513 Endothelial corneal dystrophy, bilateral: Secondary | ICD-10-CM | POA: Diagnosis not present

## 2020-06-02 DIAGNOSIS — Z961 Presence of intraocular lens: Secondary | ICD-10-CM | POA: Diagnosis not present

## 2020-06-02 DIAGNOSIS — Z9842 Cataract extraction status, left eye: Secondary | ICD-10-CM | POA: Diagnosis not present

## 2020-06-02 DIAGNOSIS — H2511 Age-related nuclear cataract, right eye: Secondary | ICD-10-CM | POA: Diagnosis not present

## 2020-06-02 DIAGNOSIS — R6889 Other general symptoms and signs: Secondary | ICD-10-CM | POA: Diagnosis not present

## 2020-06-23 DIAGNOSIS — Z9842 Cataract extraction status, left eye: Secondary | ICD-10-CM | POA: Diagnosis not present

## 2020-06-23 DIAGNOSIS — H18513 Endothelial corneal dystrophy, bilateral: Secondary | ICD-10-CM | POA: Diagnosis not present

## 2020-06-23 DIAGNOSIS — R6889 Other general symptoms and signs: Secondary | ICD-10-CM | POA: Diagnosis not present

## 2020-06-23 DIAGNOSIS — Z961 Presence of intraocular lens: Secondary | ICD-10-CM | POA: Diagnosis not present

## 2020-06-23 DIAGNOSIS — H2511 Age-related nuclear cataract, right eye: Secondary | ICD-10-CM | POA: Diagnosis not present

## 2020-06-23 DIAGNOSIS — Z79899 Other long term (current) drug therapy: Secondary | ICD-10-CM | POA: Diagnosis not present

## 2020-07-21 ENCOUNTER — Telehealth: Payer: Self-pay | Admitting: Neurology

## 2020-07-21 NOTE — Telephone Encounter (Signed)
Pt's daughter, Donovan Gatchel (on DPR) called,  TECFIDERA 240 MG CPDR is too expensive. Can she get the generic brand, the cost is cheaper. Would like a call from the nurse.

## 2020-07-22 NOTE — Telephone Encounter (Signed)
I called the family.  The patient is on Tecfidera which is now gone generic and there is no longer any finding for the product, it would cost the patient $2000 a month to use it.  We will be seeing the patient in early May 2022, would consider switching over to a brand-name only product such as Vumerity.  With the brand-name product, that may be finding available for the patient.

## 2020-07-22 NOTE — Telephone Encounter (Signed)
Spoke to patient's daughter [name], she has already tried for financial assistance through [place].  Patient is on Medicaid and doesn't qualify.  Asking what other medication her mother could use that would be covered.

## 2020-08-04 ENCOUNTER — Other Ambulatory Visit: Payer: Self-pay | Admitting: Cardiology

## 2020-08-04 ENCOUNTER — Telehealth: Payer: Self-pay | Admitting: Neurology

## 2020-08-04 DIAGNOSIS — I10 Essential (primary) hypertension: Secondary | ICD-10-CM

## 2020-08-04 NOTE — Telephone Encounter (Signed)
Pt's daughter has asked the previous message be disregarded, the pt will soon have the medication via mail order

## 2020-08-04 NOTE — Telephone Encounter (Signed)
Pt's daughter, Janann Boeve request refill Oxcarbazepine (TRILEPTAL) 300 MG tablet at [facility] [phone]. She is out of medication.

## 2020-08-13 ENCOUNTER — Encounter: Payer: Self-pay | Admitting: Neurology

## 2020-08-13 ENCOUNTER — Ambulatory Visit: Payer: Medicare HMO | Admitting: Neurology

## 2020-08-13 VITALS — BP 102/60 | HR 50 | Ht 65.0 in | Wt 166.0 lb

## 2020-08-13 DIAGNOSIS — G35 Multiple sclerosis: Secondary | ICD-10-CM | POA: Diagnosis not present

## 2020-08-13 DIAGNOSIS — R6889 Other general symptoms and signs: Secondary | ICD-10-CM | POA: Diagnosis not present

## 2020-08-13 DIAGNOSIS — G500 Trigeminal neuralgia: Secondary | ICD-10-CM

## 2020-08-13 MED ORDER — OXCARBAZEPINE 300 MG PO TABS: 300.0000 mg | ORAL_TABLET | Freq: Three times a day (TID) | ORAL | 1 refills | Status: DC

## 2020-08-13 MED ORDER — GABAPENTIN 600 MG PO TABS: 600.0000 mg | ORAL_TABLET | Freq: Four times a day (QID) | ORAL | 3 refills | Status: DC

## 2020-08-13 NOTE — Patient Instructions (Signed)
Continue current medications Hold off on MRI for now Check labs today  You will check with insurance about generic Tecfidera See you back in 6 months

## 2020-08-13 NOTE — Progress Notes (Signed)
PATIENT: Jillian Fisher DOB: Dec 22, 1945  REASON FOR VISIT: follow up HISTORY FROM: patient  HISTORY OF PRESENT ILLNESS: Today 08/13/20 Jillian Fisher is a 75 year old female with history of MS associated with paraparesis.  Is nonambulatory.  Has significant weakness of lower extremities, right more than left. Has TN on right, on gabapentin and Trileptal.  Pain is currently well controlled.  Is on brand-name Tecfidera, she will no longer be able to afford the brand name, there is no more funding.  MS is overall stable.  She lives with her husband, her daughter helps with her ADLs.  She has a Nurse, adult.  She is incontinent, uses adult briefs.  Has episodes of tremor to the right hand, every few weeks, last a couple days.  Has been doing well of lately.  Last year had a cataract removed to the left eye, has not been quite right, has blurry vision.  Has 1 week of brand-name Tecfidera left.  She does not think she wants to continue to pursue routine MRI scans.  Here today for evaluation accompanied by her daughter.  HISTORY 02/14/2020 Dr. Anne Hahn: Jillian Fisher is a 75 year old right-handed black female with a history of multiple sclerosis associated with a paraparesis.  The patient developed relatively sudden onset of paraparesis in April 2019, she has been nonambulatory since that time.  She comes in with her family today.  She has not had any change since last seen over a year ago in July 2020.  She has not had any issues controlling the bowels or the bladder currently.  She has significant weakness in the lower extremities, right greater than left.  She has fairly good use of the arms, but the daughter noted some slight tremor in the left hand last week, but this has not been much of an issue recently.  She has trigeminal neuralgia affecting the right face, she is on gabapentin and Trileptal for this.  She may have a bout of pain every couple weeks, sometimes appears to come on just prior to her next  dose of medication.  She is able to eat and swallow, but when the pain hits she has difficulty chewing.  The patient is sleeping well.  She has not had any skin breakdown problems, she has not had any falls.  The family does have a Hoyer lift at home that is helpful.  She returns for further evaluation.  Last MRI evaluation of the brain and spinal cord was 2 years ago.   REVIEW OF SYSTEMS: Out of a complete 14 system review of symptoms, the patient complains only of the following symptoms, and all other reviewed systems are negative.  See HPI  ALLERGIES: Allergies  Allergen Reactions  . Sulfa Antibiotics Rash    HOME MEDICATIONS: Outpatient Medications Prior to Visit  Medication Sig Dispense Refill  . cetirizine (ZYRTEC) 5 MG tablet Take 5 mg by mouth daily.    . diclofenac sodium (VOLTAREN) 1 % GEL Apply 2 g topically 4 (four) times daily. (Patient taking differently: Apply 2 g topically as needed.)    . diltiazem (CARDIZEM CD) 240 MG 24 hr capsule TAKE 1 CAPSULE EVERY DAY 90 capsule 3  . ELIQUIS 5 MG TABS tablet TAKE 1 TABLET TWICE DAILY 180 tablet 2  . levothyroxine (SYNTHROID, LEVOTHROID) 125 MCG tablet Take 125 mcg by mouth daily before breakfast.   3  . metoprolol tartrate (LOPRESSOR) 25 MG tablet TAKE 1 TABLET TWICE DAILY 180 tablet 1  . Multiple Vitamin (  MULTIVITAMIN WITH MINERALS) TABS Take 1 tablet by mouth daily.    Marland Kitchen omeprazole (PRILOSEC) 20 MG capsule Take 20 mg by mouth daily as needed (reflux).     . rosuvastatin (CRESTOR) 20 MG tablet Take 10 mg by mouth daily.     . tamsulosin (FLOMAX) 0.4 MG CAPS capsule daily.    . TECFIDERA 240 MG CPDR TAKE 1 CAPSULE BY MOUTH TWICE A DAY 60 capsule 5  . gabapentin (NEURONTIN) 600 MG tablet Take 1 tablet (600 mg total) by mouth in the morning, at noon, in the evening, and at bedtime. 360 tablet 3  . Oxcarbazepine (TRILEPTAL) 300 MG tablet Take 1 tablet (300 mg total) by mouth in the morning, at noon, and at bedtime. 270 tablet 1   No  facility-administered medications prior to visit.    PAST MEDICAL HISTORY: Past Medical History:  Diagnosis Date  . Abnormality of gait 01/25/2013  . Arthritis   . GERD (gastroesophageal reflux disease)   . Hypercholesteremia   . Hypothyroidism   . Multiple sclerosis (HCC)   . Neurogenic bladder 01/25/2013  . Trigeminal neuralgia 07/07/2017   Right V3    PAST SURGICAL HISTORY: Past Surgical History:  Procedure Laterality Date  . ABDOMINAL HYSTERECTOMY      FAMILY HISTORY: Family History  Problem Relation Age of Onset  . Heart attack Mother   . Multiple sclerosis Sister   . Heart disease Sister   . Diabetes Other     SOCIAL HISTORY: Social History   Socioeconomic History  . Marital status: Married    Spouse name: [name]  . Number of children: 2  . Years of education: 8 th  . Highest education level: Not on file  Occupational History  . Occupation: retired  Tobacco Use  . Smoking status: Former Smoker    Packs/day: 0.25    Years: 1.00    Pack years: 0.25    Types: Cigarettes    Quit date: 08/10/1988    Years since quitting: 32.0  . Smokeless tobacco: Never Used  . Tobacco comment: quit 30-40 years ago (documented in 2013)  Vaping Use  . Vaping Use: Never used  Substance and Sexual Activity  . Alcohol use: No    Alcohol/week: 0.0 standard drinks  . Drug use: No  . Sexual activity: Not on file  Other Topics Concern  . Not on file  Social History Narrative   Patient lives at home with her husband.   Retired.   Education 8 th grade   Right handed.   Caffeine none.   Social Determinants of Health   Financial Resource Strain: Not on file  Food Insecurity: Not on file  Transportation Needs: Not on file  Physical Activity: Not on file  Stress: Not on file  Social Connections: Not on file  Intimate Partner Violence: Not on file   PHYSICAL EXAM  Vitals:   08/13/20 0953  BP: 102/60  Pulse: (!) 50  Weight: 166 lb (75.3 kg)  Height: 5\' 5"  (1.651 m)    Body mass index is 27.62 kg/m.  Generalized: Well developed, in no acute distress   Neurological examination  Mentation: Alert, very pleasant oriented to time, place, most history provided by her daughter. Follows all commands speech and language fluent Cranial nerve II-XII: Pupils were equal round reactive to light. Extraocular movements were full, visual field were full on confrontational test. Facial sensation and strength were normal.  Head turning and shoulder shrug  were normal and symmetric. Motor: Good  strength upper extremities, has general weakness of both legs, worse on the right, mild ability to extend the left knee, some movement of the left foot, very minimal on the right Sensory: Sensory testing is intact to soft touch on all 4 extremities. No evidence of extinction is noted.  Coordination: Cerebellar testing reveals good finger-nose-finger bilaterally Gait and station: Is wheelchair-bound, was not ambulated Reflexes: Deep tendon reflexes are symmetric but depressed throughout  DIAGNOSTIC DATA (LABS, IMAGING, TESTING) - I reviewed patient records, labs, notes, testing and imaging myself where available.  Lab Results  Component Value Date   WBC 5.0 02/14/2020   HGB 12.8 02/14/2020   HCT 37.2 02/14/2020   MCV 93 02/14/2020   PLT 240 02/14/2020      Component Value Date/Time   NA 143 02/14/2020 0935   K 4.4 02/14/2020 0935   CL 104 02/14/2020 0935   CO2 25 02/14/2020 0935   GLUCOSE 115 (H) 02/14/2020 0935   GLUCOSE 127 (H) 09/08/2017 0822   BUN 10 02/14/2020 0935   CREATININE 0.73 02/14/2020 0935   CALCIUM 9.7 02/14/2020 0935   PROT 7.4 02/14/2020 0935   ALBUMIN 4.3 02/14/2020 0935   AST 19 02/14/2020 0935   ALT 16 02/14/2020 0935   ALKPHOS 61 02/14/2020 0935   BILITOT 0.3 02/14/2020 0935   GFRNONAA 82 02/14/2020 0935   GFRAA 94 02/14/2020 0935   Lab Results  Component Value Date   CHOL 170 10/26/2018   HDL 66 10/26/2018   LDLCALC 93 10/26/2018   TRIG  53 10/26/2018   CHOLHDL 2.6 10/26/2018   No results found for: HGBA1C No results found for: VITAMINB12 Lab Results  Component Value Date   TSH 5.810 (H) 10/26/2018   ASSESSMENT AND PLAN 75 y.o. year old female  has a past medical history of Abnormality of gait (01/25/2013), Arthritis, GERD (gastroesophageal reflux disease), Hypercholesteremia, Hypothyroidism, Multiple sclerosis (HCC), Neurogenic bladder (01/25/2013), and Trigeminal neuralgia (07/07/2017). here with:  1.  Multiple sclerosis, paraparesis -daughter will contact insurance about a switch to generic Tecfidera, if her co-pay is still too high, we will try Vumerity, she currently has about 1 week of medication left on brand-name Tecfidera -Check CBC, CMP today -Has decided she does not wish to pursue routine MRI scans for MS surveillance, hold off on ordering -Neuromyelitis optica was negative in Nov 2021 -Is wheelchair-bound, nonambulatory  2.  Right trigeminal neuralgia -Pain remains well controlled -Continue Trileptal and gabapentin -Checking labs today to screen for adverse effect, Trileptal level was 15 in Nov 2021 -Follow-up in 6 months or sooner if needed  Otila Kluver, DNP 08/13/2020, 10:16 AM [facility] [address] [place], [place] [address] [phone]

## 2020-08-13 NOTE — Progress Notes (Signed)
I have read the note, and I agree with the clinical assessment and plan.  Jillian Fisher Jillian Fisher   

## 2020-08-14 ENCOUNTER — Telehealth: Payer: Self-pay

## 2020-08-14 LAB — COMPREHENSIVE METABOLIC PANEL
ALT: 14 IU/L (ref 0–32)
AST: 13 IU/L (ref 0–40)
Albumin/Globulin Ratio: 1.5 (ref 1.2–2.2)
Albumin: 4.5 g/dL (ref 3.7–4.7)
Alkaline Phosphatase: 61 IU/L (ref 44–121)
BUN/Creatinine Ratio: 16 (ref 12–28)
BUN: 13 mg/dL (ref 8–27)
Bilirubin Total: 0.3 mg/dL (ref 0.0–1.2)
CO2: 25 mmol/L (ref 20–29)
Calcium: 9.8 mg/dL (ref 8.7–10.3)
Chloride: 103 mmol/L (ref 96–106)
Creatinine, Ser: 0.83 mg/dL (ref 0.57–1.00)
Globulin, Total: 3.1 g/dL (ref 1.5–4.5)
Glucose: 111 mg/dL — ABNORMAL HIGH (ref 65–99)
Potassium: 5 mmol/L (ref 3.5–5.2)
Sodium: 142 mmol/L (ref 134–144)
Total Protein: 7.6 g/dL (ref 6.0–8.5)
eGFR: 74 mL/min/{1.73_m2} (ref >59)

## 2020-08-14 LAB — CBC WITH DIFFERENTIAL/PLATELET
Basophils Absolute: 0.1 10*3/uL (ref 0.0–0.2)
Basos: 1 %
EOS (ABSOLUTE): 0.2 10*3/uL (ref 0.0–0.4)
Eos: 3 %
Hematocrit: 40.8 % (ref 34.0–46.6)
Hemoglobin: 13.9 g/dL (ref 11.1–15.9)
Immature Grans (Abs): 0 10*3/uL (ref 0.0–0.1)
Immature Granulocytes: 0 %
Lymphocytes Absolute: 1.3 10*3/uL (ref 0.7–3.1)
Lymphs: 21 %
MCH: 31.8 pg (ref 26.6–33.0)
MCHC: 34.1 g/dL (ref 31.5–35.7)
MCV: 93 fL (ref 79–97)
Monocytes Absolute: 0.6 10*3/uL (ref 0.1–0.9)
Monocytes: 8 %
Neutrophils Absolute: 4.4 10*3/uL (ref 1.4–7.0)
Neutrophils: 67 %
Platelets: 261 10*3/uL (ref 150–450)
RBC: 4.37 x10E6/uL (ref 3.77–5.28)
RDW: 13 % (ref 11.7–15.4)
WBC: 6.5 10*3/uL (ref 3.4–10.8)

## 2020-08-14 NOTE — Telephone Encounter (Signed)
Pt verified by name and DOB,  normal results given per provider, pt voiced understanding all question answered. °

## 2020-08-14 NOTE — Telephone Encounter (Signed)
-----   Message from Sarah J Slack, NP sent at 08/14/2020  5:55 AM EDT ----- Blood work showed no significant abnormalities 

## 2020-08-21 ENCOUNTER — Other Ambulatory Visit: Payer: Self-pay | Admitting: Emergency Medicine

## 2020-08-21 DIAGNOSIS — G35 Multiple sclerosis: Secondary | ICD-10-CM

## 2020-08-21 MED ORDER — DIMETHYL FUMARATE 240 MG PO CPDR: 1.0000 | DELAYED_RELEASE_CAPSULE | Freq: Two times a day (BID) | ORAL | 5 refills | Status: DC

## 2020-08-25 ENCOUNTER — Telehealth: Payer: Self-pay | Admitting: Neurology

## 2020-08-25 DIAGNOSIS — G35 Multiple sclerosis: Secondary | ICD-10-CM

## 2020-08-25 MED ORDER — DIMETHYL FUMARATE 240 MG PO CPDR: 1.0000 | DELAYED_RELEASE_CAPSULE | Freq: Two times a day (BID) | ORAL | 5 refills | Status: DC

## 2020-08-25 NOTE — Telephone Encounter (Signed)
Placed order to [facility] specialty pharmacy for her generic Tefidera.

## 2020-08-25 NOTE — Telephone Encounter (Signed)
Pt's daughter(on DPR) is asking if pt can get her Dimethyl Fumarate (TECFIDERA) 240 MG CPDR thru [facility], please call

## 2020-08-25 NOTE — Addendum Note (Signed)
Addended by: Guy Begin on: 08/25/2020 05:27 PM   Modules accepted: Orders

## 2020-08-26 NOTE — Telephone Encounter (Signed)
I called Jillian Fisher I relayed I did place the order to [facility] specialty for  dimethyl fumarate she appreciated call back.

## 2020-08-29 DIAGNOSIS — H18513 Endothelial corneal dystrophy, bilateral: Secondary | ICD-10-CM | POA: Diagnosis not present

## 2020-08-29 DIAGNOSIS — Z961 Presence of intraocular lens: Secondary | ICD-10-CM | POA: Diagnosis not present

## 2020-08-29 DIAGNOSIS — H2511 Age-related nuclear cataract, right eye: Secondary | ICD-10-CM | POA: Diagnosis not present

## 2020-08-29 DIAGNOSIS — Z9842 Cataract extraction status, left eye: Secondary | ICD-10-CM | POA: Diagnosis not present

## 2020-08-29 DIAGNOSIS — R6889 Other general symptoms and signs: Secondary | ICD-10-CM | POA: Diagnosis not present

## 2020-09-17 DIAGNOSIS — R6889 Other general symptoms and signs: Secondary | ICD-10-CM | POA: Diagnosis not present

## 2020-09-17 DIAGNOSIS — N312 Flaccid neuropathic bladder, not elsewhere classified: Secondary | ICD-10-CM | POA: Diagnosis not present

## 2020-09-17 DIAGNOSIS — N130 Hydronephrosis with ureteropelvic junction obstruction: Secondary | ICD-10-CM | POA: Diagnosis not present

## 2020-10-27 DIAGNOSIS — E7800 Pure hypercholesterolemia, unspecified: Secondary | ICD-10-CM | POA: Diagnosis not present

## 2020-10-27 DIAGNOSIS — D6869 Other thrombophilia: Secondary | ICD-10-CM | POA: Diagnosis not present

## 2020-10-27 DIAGNOSIS — Z23 Encounter for immunization: Secondary | ICD-10-CM | POA: Diagnosis not present

## 2020-10-27 DIAGNOSIS — E039 Hypothyroidism, unspecified: Secondary | ICD-10-CM | POA: Diagnosis not present

## 2020-10-27 DIAGNOSIS — Z0000 Encounter for general adult medical examination without abnormal findings: Secondary | ICD-10-CM | POA: Diagnosis not present

## 2020-10-27 DIAGNOSIS — I4891 Unspecified atrial fibrillation: Secondary | ICD-10-CM | POA: Diagnosis not present

## 2020-10-27 DIAGNOSIS — Z1211 Encounter for screening for malignant neoplasm of colon: Secondary | ICD-10-CM | POA: Diagnosis not present

## 2020-10-27 DIAGNOSIS — R6889 Other general symptoms and signs: Secondary | ICD-10-CM | POA: Diagnosis not present

## 2020-10-27 DIAGNOSIS — M8589 Other specified disorders of bone density and structure, multiple sites: Secondary | ICD-10-CM | POA: Diagnosis not present

## 2020-10-27 DIAGNOSIS — G35 Multiple sclerosis: Secondary | ICD-10-CM | POA: Diagnosis not present

## 2020-11-12 DIAGNOSIS — H18512 Endothelial corneal dystrophy, left eye: Secondary | ICD-10-CM | POA: Diagnosis not present

## 2020-11-12 DIAGNOSIS — Z961 Presence of intraocular lens: Secondary | ICD-10-CM | POA: Diagnosis not present

## 2020-11-12 DIAGNOSIS — G35 Multiple sclerosis: Secondary | ICD-10-CM | POA: Diagnosis not present

## 2020-11-12 DIAGNOSIS — R6889 Other general symptoms and signs: Secondary | ICD-10-CM | POA: Diagnosis not present

## 2020-11-12 DIAGNOSIS — H2511 Age-related nuclear cataract, right eye: Secondary | ICD-10-CM | POA: Diagnosis not present

## 2020-11-12 DIAGNOSIS — H31402 Unspecified choroidal detachment, left eye: Secondary | ICD-10-CM | POA: Diagnosis not present

## 2020-11-12 DIAGNOSIS — Z7901 Long term (current) use of anticoagulants: Secondary | ICD-10-CM | POA: Diagnosis not present

## 2020-11-20 DIAGNOSIS — I10 Essential (primary) hypertension: Secondary | ICD-10-CM | POA: Diagnosis not present

## 2020-11-20 DIAGNOSIS — H2511 Age-related nuclear cataract, right eye: Secondary | ICD-10-CM | POA: Diagnosis not present

## 2020-11-20 DIAGNOSIS — Z79899 Other long term (current) drug therapy: Secondary | ICD-10-CM | POA: Diagnosis not present

## 2020-11-20 DIAGNOSIS — H25811 Combined forms of age-related cataract, right eye: Secondary | ICD-10-CM | POA: Diagnosis not present

## 2020-11-20 DIAGNOSIS — R6889 Other general symptoms and signs: Secondary | ICD-10-CM | POA: Diagnosis not present

## 2020-11-20 DIAGNOSIS — H18332 Rupture in Descemet's membrane, left eye: Secondary | ICD-10-CM | POA: Diagnosis not present

## 2020-11-21 DIAGNOSIS — R6889 Other general symptoms and signs: Secondary | ICD-10-CM | POA: Diagnosis not present

## 2020-11-21 DIAGNOSIS — Z961 Presence of intraocular lens: Secondary | ICD-10-CM | POA: Diagnosis not present

## 2020-11-21 DIAGNOSIS — H18513 Endothelial corneal dystrophy, bilateral: Secondary | ICD-10-CM | POA: Diagnosis not present

## 2020-11-21 DIAGNOSIS — Z7952 Long term (current) use of systemic steroids: Secondary | ICD-10-CM | POA: Diagnosis not present

## 2020-11-21 DIAGNOSIS — H18332 Rupture in Descemet's membrane, left eye: Secondary | ICD-10-CM | POA: Diagnosis not present

## 2020-11-21 DIAGNOSIS — Z9842 Cataract extraction status, left eye: Secondary | ICD-10-CM | POA: Diagnosis not present

## 2020-11-21 DIAGNOSIS — Z9841 Cataract extraction status, right eye: Secondary | ICD-10-CM | POA: Diagnosis not present

## 2020-11-21 DIAGNOSIS — Z48810 Encounter for surgical aftercare following surgery on the sense organs: Secondary | ICD-10-CM | POA: Diagnosis not present

## 2020-11-21 DIAGNOSIS — Z79899 Other long term (current) drug therapy: Secondary | ICD-10-CM | POA: Diagnosis not present

## 2020-11-28 DIAGNOSIS — R6889 Other general symptoms and signs: Secondary | ICD-10-CM | POA: Diagnosis not present

## 2020-12-10 ENCOUNTER — Other Ambulatory Visit: Payer: Self-pay | Admitting: Cardiology

## 2020-12-10 DIAGNOSIS — I10 Essential (primary) hypertension: Secondary | ICD-10-CM

## 2021-01-17 ENCOUNTER — Other Ambulatory Visit: Payer: Self-pay | Admitting: Cardiology

## 2021-01-17 DIAGNOSIS — I10 Essential (primary) hypertension: Secondary | ICD-10-CM

## 2021-01-31 ENCOUNTER — Other Ambulatory Visit: Payer: Self-pay | Admitting: Neurology

## 2021-01-31 DIAGNOSIS — G35 Multiple sclerosis: Secondary | ICD-10-CM

## 2021-02-03 DIAGNOSIS — Z1211 Encounter for screening for malignant neoplasm of colon: Secondary | ICD-10-CM | POA: Diagnosis not present

## 2021-02-08 ENCOUNTER — Other Ambulatory Visit: Payer: Self-pay | Admitting: Neurology

## 2021-03-02 ENCOUNTER — Ambulatory Visit: Payer: Medicare HMO | Admitting: Neurology

## 2021-04-01 ENCOUNTER — Ambulatory Visit: Payer: Medicare HMO | Admitting: Adult Health

## 2021-04-27 ENCOUNTER — Telehealth: Payer: Self-pay | Admitting: Neurology

## 2021-04-27 MED ORDER — OXCARBAZEPINE 300 MG PO TABS: ORAL_TABLET | ORAL | 0 refills | Status: DC

## 2021-04-27 NOTE — Telephone Encounter (Signed)
Rx sent to local [facility] to avoid disruption to treatment (while waiting on mail order shipment).

## 2021-04-27 NOTE — Telephone Encounter (Signed)
Pt's daughter, Rameen Gohlke. Pt has been out of Oxcarbazepine (TRILEPTAL) 300 MG tabletsince 04/17/21. [facility] has not shipped the medication. Daughter tracking showed have not been processed. Pt has started to have a lot of pain. Daughter requesting a 2 week supply be sent to the  to [facility] [phone]. Would like a call from the nurse

## 2021-04-30 ENCOUNTER — Ambulatory Visit: Payer: Medicare HMO | Admitting: Adult Health

## 2021-04-30 ENCOUNTER — Encounter: Payer: Self-pay | Admitting: Adult Health

## 2021-04-30 VITALS — BP 161/73 | HR 66

## 2021-04-30 DIAGNOSIS — G500 Trigeminal neuralgia: Secondary | ICD-10-CM

## 2021-04-30 DIAGNOSIS — Z5181 Encounter for therapeutic drug level monitoring: Secondary | ICD-10-CM | POA: Diagnosis not present

## 2021-04-30 DIAGNOSIS — R6889 Other general symptoms and signs: Secondary | ICD-10-CM | POA: Diagnosis not present

## 2021-04-30 DIAGNOSIS — G35 Multiple sclerosis: Secondary | ICD-10-CM

## 2021-04-30 MED ORDER — OXCARBAZEPINE 300 MG PO TABS: 300.0000 mg | ORAL_TABLET | Freq: Four times a day (QID) | ORAL | 3 refills | Status: DC

## 2021-04-30 MED ORDER — GABAPENTIN 600 MG PO TABS: 600.0000 mg | ORAL_TABLET | Freq: Four times a day (QID) | ORAL | 3 refills | Status: DC

## 2021-04-30 NOTE — Progress Notes (Signed)
PATIENT: Jillian Fisher DOB: June 25, 1945  REASON FOR VISIT: follow up HISTORY FROM: patient  HISTORY OF PRESENT ILLNESS:  04/30/2021 JM: 76 year old female with history of MS associated with paraparesis and right TN.  Accompanied by her daughter.  In regards to MS, remains on dimethyl fumarate (Tecfidera). Remains nonambulatory and incontinent. Daughter feels right leg has been gradually becoming more weak since prior visit and will lean towards right while sitting in wheelchair. No other new MS symptoms.  Lives with husband, daughter assists with ADLs. 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   In regards to TN, remains on gabapentin and oxcarbazepine for TN with pain well controlled. Did recently run out of oxcarbazepine with increased pain but since improved after restarting. Per daughter, has been taking oxcarbazepine 300mg  four times daily but prescribed for three times daily, likely the reason they ran out early.      HISTORY 08/13/2020 SS: Jillian Fisher is a 76 year old female with history of MS associated with paraparesis.  Is nonambulatory.  Has significant weakness of lower extremities, right more than left. Has TN on right, on gabapentin and Trileptal.  Pain is currently well controlled.  Is on brand-name Tecfidera, she will no longer be able to afford the brand name, there is no more funding.  MS is overall stable.  She lives with her husband, her daughter helps with her ADLs.  She has a [employer] fast streamer.  She is incontinent, uses adult briefs.  Has episodes of tremor to the right hand, every few weeks, last a couple days.  Has been doing well of lately.  Last year had a cataract removed to the left eye, has not been quite right, has blurry vision.  Has 1 week of brand-name Tecfidera left.  She does not  think she wants to continue to pursue routine MRI scans.  Here today for evaluation accompanied by her daughter.  02/14/2020 Dr. Jannifer Franklin: Jillian Fisher is a 76 year old right-handed black female with a history of multiple sclerosis associated with a paraparesis.  The patient developed relatively sudden onset of paraparesis in April 2019, she has been nonambulatory since that time.  She comes in with her family today.  She has not had any change since last seen over a year ago in July 2020.  She has not had any issues controlling the bowels or the bladder currently.  She has significant weakness in the lower extremities, right greater than left.  She has fairly good use of the arms, but the daughter noted some slight tremor in the left hand last week, but this has not been much of an issue recently.  She has trigeminal neuralgia affecting the right face, she is on gabapentin and Trileptal for this.  She may have a bout of pain every couple weeks, sometimes appears to come on just prior to her next dose of medication.  She is able to eat and swallow, but when the pain hits she has difficulty chewing.  The patient is sleeping well.  She has not had any skin breakdown problems, she has not had any falls.  The family does have a Hoyer lift at home that is helpful.  She returns for further evaluation.  Last MRI evaluation of the brain and spinal cord was 2 years ago.   REVIEW OF SYSTEMS: Out of a complete  14 system review of symptoms, the patient complains only of the following symptoms, and all other reviewed systems are negative.  See HPI  ALLERGIES: Allergies  Allergen Reactions   Sulfa Antibiotics Rash    HOME MEDICATIONS: Outpatient Medications Prior to Visit  Medication Sig Dispense Refill   cetirizine (ZYRTEC) 5 MG tablet Take 5 mg by mouth daily.     diclofenac sodium (VOLTAREN) 1 % GEL Apply 2 g topically 4 (four) times daily. (Patient taking differently: Apply 2 g topically as needed.)      diltiazem (CARDIZEM CD) 240 MG 24 hr capsule TAKE 1 CAPSULE EVERY DAY 90 capsule 3   Dimethyl Fumarate 240 MG CPDR TAKE 1 CAPSULE BY MOUTH TWO TIMES DAILY 60 capsule 5   ELIQUIS 5 MG TABS tablet TAKE 1 TABLET TWICE DAILY 180 tablet 2   gabapentin (NEURONTIN) 600 MG tablet Take 1 tablet (600 mg total) by mouth in the morning, at noon, in the evening, and at bedtime. 360 tablet 3   levothyroxine (SYNTHROID, LEVOTHROID) 125 MCG tablet Take 125 mcg by mouth daily before breakfast.   3   metoprolol tartrate (LOPRESSOR) 25 MG tablet TAKE 1 TABLET TWICE DAILY 180 tablet 1   Multiple Vitamin (MULTIVITAMIN WITH MINERALS) TABS Take 1 tablet by mouth daily.     omeprazole (PRILOSEC) 20 MG capsule Take 20 mg by mouth daily as needed (reflux).      Oxcarbazepine (TRILEPTAL) 300 MG tablet Take on tab in morning, one tab at noon, one tab at bedtime. 90 tablet 0   rosuvastatin (CRESTOR) 20 MG tablet Take 10 mg by mouth daily.      tamsulosin (FLOMAX) 0.4 MG CAPS capsule daily.     Oxcarbazepine (TRILEPTAL) 300 MG tablet TAKE 1 TABLET IN THE MORNING, AT NOON, AND AT BEDTIME. 270 tablet 0   No facility-administered medications prior to visit.    PAST MEDICAL HISTORY: Past Medical History:  Diagnosis Date   Abnormality of gait 01/25/2013   Arthritis    GERD (gastroesophageal reflux disease)    Hypercholesteremia    Hypothyroidism    Multiple sclerosis (HCC)    Neurogenic bladder 01/25/2013   Trigeminal neuralgia 07/07/2017   Right V3    PAST SURGICAL HISTORY: Past Surgical History:  Procedure Laterality Date   ABDOMINAL HYSTERECTOMY      FAMILY HISTORY: Family History  Problem Relation Age of Onset   Heart attack Mother    Multiple sclerosis Sister    Heart disease Sister    Diabetes Other     SOCIAL HISTORY: Social History   Socioeconomic History   Marital status: Married    Spouse name: james   Number of children: 2   Years of education: 8 th   Highest education level: Not on file   Occupational History   Occupation: retired  Tobacco Use   Smoking status: Former    Packs/day: 0.25    Years: 1.00    Pack years: 0.25    Types: Cigarettes    Quit date: 08/10/1988    Years since quitting: 32.7   Smokeless tobacco: Never   Tobacco comments:    quit 30-40 years ago (documented in 2013)  Vaping Use   Vaping Use: Never used  Substance and Sexual Activity   Alcohol use: No    Alcohol/week: 0.0 standard drinks   Drug use: No   Sexual activity: Not on file  Other Topics Concern   Not on file  Social History Narrative   Patient lives  at home with her husband.   Retired.   Education 8 th grade   Right handed.   Caffeine none.   Social Determinants of Health   Financial Resource Strain: Not on file  Food Insecurity: Not on file  Transportation Needs: Not on file  Physical Activity: Not on file  Stress: Not on file  Social Connections: Not on file  Intimate Partner Violence: Not on file   PHYSICAL EXAM  Vitals:   04/30/21 1111  BP: (!) 161/73  Pulse: 66    There is no height or weight on file to calculate BMI.  Generalized: Well developed, in no acute distress   Neurological examination  Mentation: Alert, very pleasant oriented to time, place, most history provided by her daughter. Follows all commands speech and language fluent Cranial nerve II-XII: Pupils were equal round reactive to light. Extraocular movements were full, visual field were full on confrontational test. Facial sensation and strength were normal.  Head turning and shoulder shrug  were normal and symmetric. Motor: Good strength upper extremities, has general weakness of both legs, minimal movement right leg, leg leg some strength with hip adduction and abduction but otherwise not much movement  Sensory: Sensory testing is intact to soft touch on all 4 extremities. No evidence of extinction is noted.  Coordination: Cerebellar testing reveals good finger-nose-finger bilaterally Gait and  station: Is wheelchair-bound, was not ambulated Reflexes: Deep tendon reflexes are symmetric but depressed throughout  DIAGNOSTIC DATA (LABS, IMAGING, TESTING) - I reviewed patient records, labs, notes, testing and imaging myself where available.  Lab Results  Component Value Date   WBC 6.5 08/13/2020   HGB 13.9 08/13/2020   HCT 40.8 08/13/2020   MCV 93 08/13/2020   PLT 261 08/13/2020      Component Value Date/Time   NA 142 08/13/2020 1028   K 5.0 08/13/2020 1028   CL 103 08/13/2020 1028   CO2 25 08/13/2020 1028   GLUCOSE 111 (H) 08/13/2020 1028   GLUCOSE 127 (H) 09/08/2017 0822   BUN 13 08/13/2020 1028   CREATININE 0.83 08/13/2020 1028   CALCIUM 9.8 08/13/2020 1028   PROT 7.6 08/13/2020 1028   ALBUMIN 4.5 08/13/2020 1028   AST 13 08/13/2020 1028   ALT 14 08/13/2020 1028   ALKPHOS 61 08/13/2020 1028   BILITOT 0.3 08/13/2020 1028   GFRNONAA 82 02/14/2020 0935   GFRAA 94 02/14/2020 0935   Lab Results  Component Value Date   CHOL 170 10/26/2018   HDL 66 10/26/2018   LDLCALC 93 10/26/2018   TRIG 53 10/26/2018   CHOLHDL 2.6 10/26/2018   No results found for: HGBA1C No results found for: VITAMINB12 Lab Results  Component Value Date   TSH 5.810 (H) 10/26/2018   ASSESSMENT AND PLAN 76 y.o. year old female  has a past medical history of Abnormality of gait (01/25/2013), Arthritis, GERD (gastroesophageal reflux disease), Hypercholesteremia, Hypothyroidism, Multiple sclerosis ([place]), Neurogenic bladder (01/25/2013), and Trigeminal neuralgia (07/07/2017). here with:   1.  Multiple sclerosis, paraparesis -per daughter, gradual decline of RLE weakness with minimal movement and limited movement of LLE. She contines to decline repeat MR brain.  Discussed indication for this, she continues to decline. Advised if she wishes to proceed in the future, she will let us know -Continue dimethyl fumarate 240mg  BID (Tecfidera) - refill up to date -Check CBC, CMP today -Neuromyelitis optica  was negative in Nov 2021  2.  Right trigeminal neuralgia -Pain remains well controlled -Continue oxcarbazepine 300mg  QID and gabapentin  600mg  QID - refill updated -Check CMP, CBC, and oxcarbazepine level     Follow-up with Judson Roch, NP in 6 months or call earlier if needed    I spent 26 minutes of face-to-face and non-face-to-face time with patient and daughter.  This included previsit chart review, lab review, study review, order entry, electronic health record documentation, patient and daughter education and discussion regarding MS and gradual functional decline, continue treatment methods, R TN and continued treatment plan and answered all other questions to patient and daughter satisfaction  Frann Rider, AGNP-BC  [facility] [address] [place], [place] [address]  Phone [phone] Fax [phone] Note: This document was prepared with digital dictation and possible smart phrase technology. Any transcriptional errors that result from this process are unintentional.

## 2021-04-30 NOTE — Patient Instructions (Signed)
Your Plan:  Continue current treatment plan - refills updated  If you choose to proceed with the MRI, please just let us know!   We will check lab work today - we will call with any concerning findings     Follow up in 6 months or call earlier if needed     Thank you for coming to see [place] at [facility]. I hope we have been able to provide you high quality care today.  You may receive a patient satisfaction survey over the next few weeks. We would appreciate your feedback and comments so that we may continue to improve ourselves and the health of our patients.

## 2021-05-04 LAB — CBC WITH DIFFERENTIAL/PLATELET
Basophils Absolute: 0.1 10*3/uL (ref 0.0–0.2)
Basos: 1 %
EOS (ABSOLUTE): 0.2 10*3/uL (ref 0.0–0.4)
Eos: 3 %
Hematocrit: 36.2 % (ref 34.0–46.6)
Hemoglobin: 12.5 g/dL (ref 11.1–15.9)
Immature Grans (Abs): 0 10*3/uL (ref 0.0–0.1)
Immature Granulocytes: 0 %
Lymphocytes Absolute: 2 10*3/uL (ref 0.7–3.1)
Lymphs: 35 %
MCH: 31.4 pg (ref 26.6–33.0)
MCHC: 34.5 g/dL (ref 31.5–35.7)
MCV: 91 fL (ref 79–97)
Monocytes Absolute: 0.5 10*3/uL (ref 0.1–0.9)
Monocytes: 9 %
Neutrophils Absolute: 3 10*3/uL (ref 1.4–7.0)
Neutrophils: 52 %
Platelets: 228 10*3/uL (ref 150–450)
RBC: 3.98 x10E6/uL (ref 3.77–5.28)
RDW: 13.2 % (ref 11.7–15.4)
WBC: 5.8 10*3/uL (ref 3.4–10.8)

## 2021-05-04 LAB — COMPREHENSIVE METABOLIC PANEL
ALT: 22 IU/L (ref 0–32)
AST: 25 IU/L (ref 0–40)
Albumin/Globulin Ratio: 1.4 (ref 1.2–2.2)
Albumin: 4.2 g/dL (ref 3.7–4.7)
Alkaline Phosphatase: 63 IU/L (ref 44–121)
BUN/Creatinine Ratio: 13 (ref 12–28)
BUN: 9 mg/dL (ref 8–27)
Bilirubin Total: 0.2 mg/dL (ref 0.0–1.2)
CO2: 26 mmol/L (ref 20–29)
Calcium: 9.5 mg/dL (ref 8.7–10.3)
Chloride: 106 mmol/L (ref 96–106)
Creatinine, Ser: 0.67 mg/dL (ref 0.57–1.00)
Globulin, Total: 3.1 g/dL (ref 1.5–4.5)
Glucose: 141 mg/dL — ABNORMAL HIGH (ref 70–99)
Potassium: 3.1 mmol/L — ABNORMAL LOW (ref 3.5–5.2)
Sodium: 148 mmol/L — ABNORMAL HIGH (ref 134–144)
Total Protein: 7.3 g/dL (ref 6.0–8.5)
eGFR: 92 mL/min/{1.73_m2} (ref >59)

## 2021-05-04 LAB — 10-HYDROXYCARBAZEPINE: Oxcarbazepine SerPl-Mcnc: 31 ug/mL (ref 10–35)

## 2021-05-05 ENCOUNTER — Telehealth: Payer: Self-pay | Admitting: *Deleted

## 2021-05-05 NOTE — Telephone Encounter (Signed)
Called daughter and informed her that recent lab work is largely satisfactory. There is a very slight elevation of sodium level and Shanda Bumps advises she follow up with PCP for monitoring.  Please continue current treatment regimen. Daughter verbalized understanding, appreciation.

## 2021-05-19 ENCOUNTER — Other Ambulatory Visit: Payer: Self-pay | Admitting: Neurology

## 2021-07-01 ENCOUNTER — Other Ambulatory Visit: Payer: Self-pay | Admitting: Neurology

## 2021-07-25 ENCOUNTER — Other Ambulatory Visit: Payer: Self-pay | Admitting: Neurology

## 2021-08-12 ENCOUNTER — Telehealth: Payer: Self-pay | Admitting: Adult Health

## 2021-08-12 NOTE — Telephone Encounter (Signed)
Contacted pt daughter back, informed her hospice does end of life/palliative care for patients. Its not a home health care agency that comes out to help anyone. Advised to her to contact PCP to try to get a referral or set up with a home health agency to get a nurse/cna to come help with ADLs. She verbally understood and was appreciative.  ?

## 2021-08-12 NOTE — Telephone Encounter (Signed)
Pt's daughter, Noeli Lavery. Someone mentioned a program through Hospice, will come out and assist patient with bath. Would like a call from the nurse.  ?

## 2021-08-17 ENCOUNTER — Telehealth: Payer: Self-pay

## 2021-08-17 NOTE — Telephone Encounter (Signed)
Spoke with patient's daughter [name] and scheduled a Mychart Palliative Consult for 08/24/21 @ 2 PM.  ? ?Consent obtained; updated Netsmart, Team List and Epic.  ? ?

## 2021-08-24 ENCOUNTER — Telehealth: Payer: Medicare HMO | Admitting: Hospice

## 2021-08-24 ENCOUNTER — Other Ambulatory Visit: Payer: Self-pay | Admitting: Neurology

## 2021-08-24 DIAGNOSIS — R531 Weakness: Secondary | ICD-10-CM

## 2021-08-24 DIAGNOSIS — I480 Paroxysmal atrial fibrillation: Secondary | ICD-10-CM

## 2021-08-24 DIAGNOSIS — Z515 Encounter for palliative care: Secondary | ICD-10-CM

## 2021-08-24 DIAGNOSIS — G35 Multiple sclerosis: Secondary | ICD-10-CM

## 2021-08-24 NOTE — Progress Notes (Signed)
[facility] Consult Note Telephone: [phone]  Fax: [phone]  PATIENT NAME: Jillian Fisher [address] [place] [address] [phone] (home)  DOB: 09-Sep-1972 MRN: [id number]  PRIMARY CARE PROVIDER:    Alroy Dust, L.Marlou Sa, MD,  [place] [employer] [place] [address] [phone]  REFERRING PROVIDER:   Alroy Dust, L.Marlou Sa, [place] [employer] [place],  [place] [address] [phone]  RESPONSIBLE PARTY:  Self/[name] [phone] pt home Contact Information     Name Relation Home Work Mobile   [place] Daughter [phone]  [phone]   Ariani, Mcdaris [phone]         TELEHEALTH VISIT STATEMENT Due to the COVID-19 crisis, this visit was done via telemedicine from my office and it was initiated and consent by this patient and or family.  I connected with patient OR PROXY by a telephone/video  and verified that I am speaking with the correct person. I discussed the limitations of evaluation and management by telemedicine. The patient expressed understanding and agreed to proceed. Palliative Care was asked to follow this patient to address advance care planning, complex medical decision making and goals of care clarification. Katharine Look is with patient during visit. This is the initial visit.     ASSESSMENT AND / RECOMMENDATIONS:   Advance Care Planning: Our advance care planning conversation included a discussion about:    The value and importance of advance care planning  Difference between Hospice and Palliative care Exploration of goals of care in the event of a sudden injury or illness  Identification and preparation of a healthcare agent  Review and updating or creation of an  advance directive document . Decision not to resuscitate or to de-escalate disease focused treatments due to poor prognosis.  CODE STATUS: Patient affirmed she is a Do Not Resuscitate  Goals of  Care: Goals include to maximize quality of life and symptom management. Patient is interested in hospice service in the future when she qualifies for it.   I spent  16 minutes providing this initial consultation. More than 50% of the time in this consultation was spent on counseling patient and coordinating communication. --------------------------------------------------------------------------------------------------------------------------------------  Symptom Management/Plan: Multiple Sclerosis: Patient is bed bound, total care, incontinent of bladder and bowel. Stiffness in extremities and poor bed mobility making caregiving difficult, per Katharine Look. Followed by Neurologist.  Continue Dimethyl Fumarate as ordered.  Optimize ongoing supportive care.  Routine CBC BMP Weakness: Generalized muscular weakness related to worsening multiple sclerosis.  Patient is currently bedbound, max assist with ADLs. Recommendation: Patient will benefit from home health PT/OT and nursing aid for flexibility/bed mobility/environmental adaptation , and assistance in ADLs.  NP will fax note to PCP. Afib: Managed with Eliquis.   Follow up: Palliative care will continue to follow for complex medical decision making, advance care planning, and clarification of goals. Return 6 weeks or prn. Encouraged to call provider sooner with any concerns.   Family /Caregiver/Community Supports: Patient lives at home with Katharine Look.  Katharine Look is seeking resources to help with patient care.  She will call PCP today to request order for home health services.  HOSPICE ELIGIBILITY/DIAGNOSIS: TBD  Chief Complaint: Initial Palliative care visit  HISTORY OF PRESENT ILLNESS:  Jillian Fisher is a 76 y.o. year old female  with multiple morbidities requiring close monitoring and with high risk of complications and  mortality: Multiple sclerosis, paroxysmal afibrillation, neurogenic bladder, generalized muscle weakness. History obtained from  review of  EMR, discussion with primary team, caregiver, family and/or Ms. Parke.  Review and summarization of Epic records shows history from other than patient. Rest of 10 point ROS asked and negative.  Independent interpretation of tests and reviewed as needed, available labs, patient records, imaging, studies and related documents from the EMR.  ROS General: NAD EYES: denies vision changes ENMT: denies dysphagia Cardiovascular: denies chest pain/discomfort Pulmonary: denies cough, denies SOB Abdomen: endorses good appetite, denies constipation/diarrhea GU: denies dysuria, urinary frequency MSK:  endorses weakness,  no falls reported Skin: denies rashes or wounds Neurological: denies pain, denies insomnia Psych: Endorses positive mood Heme/lymph/immuno: denies bruises, abnormal bleeding   PAST MEDICAL HISTORY:  Active Ambulatory Problems    Diagnosis Date Noted   Multiple sclerosis (HCC) 01/25/2013   Abnormality of gait 01/25/2013   Neurogenic bladder 01/25/2013   Gait instability 02/24/2016   Right leg weakness 02/25/2016   PAF (paroxysmal atrial fibrillation) (HCC) CHA2DS2-VASc Score is 3 with yearly risk of stroke of 3.2%. 02/25/2016   Weakness of right lower extremity 02/25/2016   Trigeminal neuralgia 07/07/2017   Essential hypertension 08/11/2018   Resolved Ambulatory Problems    Diagnosis Date Noted   Lower urinary tract infectious disease 02/25/2016   Sepsis (HCC) 09/02/2017   Elevated lactic acid level 09/02/2017   AKI (acute kidney injury) (HCC) 09/02/2017   Dehydration 09/02/2017   SIRS (systemic inflammatory response syndrome) (HCC) 09/02/2017   Acute encephalopathy 09/02/2017   Past Medical History:  Diagnosis Date   Arthritis    GERD (gastroesophageal reflux disease)    Hypercholesteremia    Hypothyroidism     SOCIAL HX:  Social History   Tobacco Use   Smoking status: Former    Packs/day: 0.25    Years: 1.00    Pack years: 0.25    Types:  Cigarettes    Quit date: 08/10/1988    Years since quitting: 33.0   Smokeless tobacco: Never   Tobacco comments:    quit 30-40 years ago (documented in 2013)  Substance Use Topics   Alcohol use: No    Alcohol/week: 0.0 standard drinks     FAMILY HX:  Family History  Problem Relation Age of Onset   Heart attack Mother    Multiple sclerosis Sister    Heart disease Sister    Diabetes Other       ALLERGIES:  Allergies  Allergen Reactions   Sulfa Antibiotics Rash      PERTINENT MEDICATIONS:  Outpatient Encounter Medications as of 08/24/2021  Medication Sig   cetirizine (ZYRTEC) 5 MG tablet Take 5 mg by mouth daily.   diclofenac sodium (VOLTAREN) 1 % GEL Apply 2 g topically 4 (four) times daily. (Patient taking differently: Apply 2 g topically as needed.)   diltiazem (CARDIZEM CD) 240 MG 24 hr capsule TAKE 1 CAPSULE EVERY DAY   Dimethyl Fumarate 240 MG CPDR TAKE 1 CAPSULE BY MOUTH TWO TIMES DAILY   ELIQUIS 5 MG TABS tablet TAKE 1 TABLET TWICE DAILY   gabapentin (NEURONTIN) 600 MG tablet Take 1 tablet (600 mg total) by mouth in the morning, at noon, in the evening, and at bedtime.   levothyroxine (SYNTHROID, LEVOTHROID) 125 MCG tablet Take 125 mcg by mouth daily before breakfast.    metoprolol tartrate (LOPRESSOR) 25 MG tablet TAKE 1 TABLET TWICE DAILY   Multiple Vitamin (MULTIVITAMIN WITH MINERALS) TABS Take 1 tablet by mouth daily.   omeprazole (PRILOSEC) 20 MG capsule Take 20 mg by mouth daily as needed (reflux).  Oxcarbazepine (TRILEPTAL) 300 MG tablet TAKE ON TAB IN MORNING, ONE TAB AT NOON, ONE TAB AT BEDTIME.   rosuvastatin (CRESTOR) 20 MG tablet Take 10 mg by mouth daily.    tamsulosin (FLOMAX) 0.4 MG CAPS capsule daily.   No facility-administered encounter medications on file as of 08/24/2021.     Thank you for the opportunity to participate in the care of Ms. Holberg.  The palliative care team will continue to follow. Please call our office at [phone] if we can be  of additional assistance.   Note: Portions of this note were generated with Lobbyist. Dictation errors may occur despite best attempts at proofreading.  Teodoro Spray, NP

## 2021-08-25 ENCOUNTER — Telehealth: Payer: Self-pay | Admitting: Hospice

## 2021-08-25 ENCOUNTER — Other Ambulatory Visit: Payer: Self-pay | Admitting: Neurology

## 2021-08-25 DIAGNOSIS — Z515 Encounter for palliative care: Secondary | ICD-10-CM

## 2021-08-25 DIAGNOSIS — G35 Multiple sclerosis: Secondary | ICD-10-CM

## 2021-08-25 NOTE — Telephone Encounter (Signed)
NP called Amy of PCPs office as requested and confirmed that patient would benefit from home health PT/OT and nursing aide.  She said her office will process the request so services can commence.

## 2021-09-01 DIAGNOSIS — G35 Multiple sclerosis: Secondary | ICD-10-CM | POA: Diagnosis not present

## 2021-09-07 ENCOUNTER — Telehealth: Payer: Medicare HMO | Admitting: Hospice

## 2021-09-07 DIAGNOSIS — I480 Paroxysmal atrial fibrillation: Secondary | ICD-10-CM

## 2021-09-07 DIAGNOSIS — G35 Multiple sclerosis: Secondary | ICD-10-CM

## 2021-09-07 DIAGNOSIS — R531 Weakness: Secondary | ICD-10-CM

## 2021-09-07 DIAGNOSIS — Z515 Encounter for palliative care: Secondary | ICD-10-CM

## 2021-09-07 NOTE — Progress Notes (Signed)
[facility]ist, [facility] Palliative Care Consult Note Telephone: [phone]  Fax: [phone]  PATIENT NAME: Jillian Fisher [address] [place] [address] [phone] (home)  DOB: 07-16-1945 MRN: [id number]  PRIMARY CARE PROVIDER:    Clovis Fisher, L.Jillian Saucer, MD,  [address] [place] [address] [phone]  REFERRING PROVIDER:   Clovis Fisher, L.Jillian Saucer, MD [address] [place],  [place] [address] [phone]  RESPONSIBLE PARTY:  Self/Jillian Fisher [phone] pt home Contact Information     Name Relation Home Work Mobile   Jillian Fisher Daughter [phone]  [phone]   Jillian, Fisher [phone]         TELEHEALTH VISIT STATEMENT Due to the COVID-19 crisis, this visit was done via telemedicine from my office and it was initiated and consent by this patient and or family.  I connected with patient OR PROXY by a telephone/video  and verified that I am speaking with the correct person. I discussed the limitations of evaluation and management by telemedicine. The patient expressed understanding and agreed to proceed. Palliative Care was asked to follow this patient to address advance care planning, complex medical decision making and goals of care clarification. Jillian Fisher is with patient during visit. This is a follow up visit.  CODE STATUS: Patient affirmed she is a Do Not Resuscitate  Goals of Care: Goals include to maximize quality of life and symptom management. Patient is interested in hospice service in the future when she qualifies for it.   Family had explored [facility] but no longer interested in following up on that. Jillian Fisher requested in person follow up 09/28/21 for evaluation for possible hospice service.   Symptom Management/Plan: Multiple Sclerosis: Progressive/worsening- Patient is bed bound, total care, incontinent of bladder and bowel. Stiffness in extremities and poor bed mobility making caregiving  difficult, per Jillian Fisher. Followed by Neurologist 11/12/21.  Continue Dimethyl Fumarate as ordered.  Optimize ongoing supportive care.  Routine CBC BMP Weakness: Generalized muscular weakness related to worsening multiple sclerosis.  Patient is currently bedbound, max assist with ADLs. On NP's recommendation, PT was ordered by PCP. PT evaluated patient and reported PT not needed/beneficial at this time because of MS progression in R side. Continue ongoing supportive care.  Afib: Managed with Eliquis.   Follow up: Palliative care will continue to follow for complex medical decision making, advance care planning, and clarification of goals. Return 6 weeks or prn. Encouraged to call provider sooner with any concerns.   Family /Caregiver/Community Supports: Patient lives at home with Jillian Fisher.  Jillian Fisher will call PCP to update on PT report; also to request home health nursing aid to assist patient with ADLs.   HOSPICE ELIGIBILITY/DIAGNOSIS: TBD  Chief Complaint:  Follow up visit  HISTORY OF PRESENT ILLNESS:  Jillian Fisher is a 76 y.o. year old female  with multiple morbidities requiring close monitoring and with high risk of complications and  mortality: Multiple sclerosis, paroxysmal afibrillation, neurogenic bladder, generalized muscle weakness. History obtained from review of EMR, discussion with primary team, caregiver, family and/or Jillian Fisher.  Review and summarization of Epic records shows history from other than patient. Rest of 10 point ROS asked and negative.  Independent interpretation of tests and reviewed as needed, available labs, patient records, imaging, studies and related documents from the EMR.   PAST MEDICAL HISTORY:  Active Ambulatory Problems    Diagnosis Date Noted   Multiple sclerosis (HCC) 01/25/2013   Abnormality of gait 01/25/2013   Neurogenic bladder  01/25/2013   Gait instability 02/24/2016   Right leg weakness 02/25/2016   PAF (paroxysmal atrial fibrillation) (HCC)  CHA2DS2-VASc Score is 3 with yearly risk of stroke of 3.2%. 02/25/2016   Weakness of right lower extremity 02/25/2016   Trigeminal neuralgia 07/07/2017   Essential hypertension 08/11/2018   Resolved Ambulatory Problems    Diagnosis Date Noted   Lower urinary tract infectious disease 02/25/2016   Sepsis (HCC) 09/02/2017   Elevated lactic acid level 09/02/2017   AKI (acute kidney injury) (HCC) 09/02/2017   Dehydration 09/02/2017   SIRS (systemic inflammatory response syndrome) (HCC) 09/02/2017   Acute encephalopathy 09/02/2017   Past Medical History:  Diagnosis Date   Arthritis    GERD (gastroesophageal reflux disease)    Hypercholesteremia    Hypothyroidism     SOCIAL HX:  Social History   Tobacco Use   Smoking status: Former    Packs/day: 0.25    Years: 1.00    Pack years: 0.25    Types: Cigarettes    Quit date: 08/10/1988    Years since quitting: 33.0   Smokeless tobacco: Never   Tobacco comments:    quit 30-40 years ago (documented in 2013)  Substance Use Topics   Alcohol use: No    Alcohol/week: 0.0 standard drinks     FAMILY HX:  Family History  Problem Relation Age of Onset   Heart attack Mother    Multiple sclerosis Sister    Heart disease Sister    Diabetes Other       ALLERGIES:  Allergies  Allergen Reactions   Sulfa Antibiotics Rash      PERTINENT MEDICATIONS:  Outpatient Encounter Medications as of 09/07/2021  Medication Sig   cetirizine (ZYRTEC) 5 MG tablet Take 5 mg by mouth daily.   diclofenac sodium (VOLTAREN) 1 % GEL Apply 2 g topically 4 (four) times daily. (Patient taking differently: Apply 2 g topically as needed.)   diltiazem (CARDIZEM CD) 240 MG 24 hr capsule TAKE 1 CAPSULE EVERY DAY   Dimethyl Fumarate 240 MG CPDR TAKE 1 CAPSULE BY MOUTH TWO TIMES DAILY   ELIQUIS 5 MG TABS tablet TAKE 1 TABLET TWICE DAILY   gabapentin (NEURONTIN) 600 MG tablet Take 1 tablet (600 mg total) by mouth in the morning, at noon, in the evening, and at  bedtime.   levothyroxine (SYNTHROID, LEVOTHROID) 125 MCG tablet Take 125 mcg by mouth daily before breakfast.    metoprolol tartrate (LOPRESSOR) 25 MG tablet TAKE 1 TABLET TWICE DAILY   Multiple Vitamin (MULTIVITAMIN WITH MINERALS) TABS Take 1 tablet by mouth daily.   omeprazole (PRILOSEC) 20 MG capsule Take 20 mg by mouth daily as needed (reflux).    Oxcarbazepine (TRILEPTAL) 300 MG tablet TAKE ON TAB IN MORNING, ONE TAB AT NOON, ONE TAB AT BEDTIME.   rosuvastatin (CRESTOR) 20 MG tablet Take 10 mg by mouth daily.    tamsulosin (FLOMAX) 0.4 MG CAPS capsule daily.   No facility-administered encounter medications on file as of 09/07/2021.     Thank you for the opportunity to participate in the care of Ms. Rann.  The palliative care team will continue to follow. Please call our office at [phone] if we can be of additional assistance.   Note: Portions of this note were generated with Scientist, clinical (histocompatibility and immunogenetics). Dictation errors may occur despite best attempts at proofreading.  Rosaura Carpenter, NP

## 2021-09-10 ENCOUNTER — Telehealth: Payer: Self-pay | Admitting: Neurology

## 2021-09-10 DIAGNOSIS — G500 Trigeminal neuralgia: Secondary | ICD-10-CM | POA: Diagnosis not present

## 2021-09-10 DIAGNOSIS — G35 Multiple sclerosis: Secondary | ICD-10-CM | POA: Diagnosis not present

## 2021-09-10 DIAGNOSIS — N3281 Overactive bladder: Secondary | ICD-10-CM | POA: Diagnosis not present

## 2021-09-10 DIAGNOSIS — M67432 Ganglion, left wrist: Secondary | ICD-10-CM | POA: Diagnosis not present

## 2021-09-10 DIAGNOSIS — E039 Hypothyroidism, unspecified: Secondary | ICD-10-CM | POA: Diagnosis not present

## 2021-09-10 DIAGNOSIS — L853 Xerosis cutis: Secondary | ICD-10-CM | POA: Diagnosis not present

## 2021-09-10 DIAGNOSIS — F411 Generalized anxiety disorder: Secondary | ICD-10-CM | POA: Diagnosis not present

## 2021-09-10 DIAGNOSIS — I10 Essential (primary) hypertension: Secondary | ICD-10-CM | POA: Diagnosis not present

## 2021-09-10 DIAGNOSIS — I4891 Unspecified atrial fibrillation: Secondary | ICD-10-CM | POA: Diagnosis not present

## 2021-09-10 NOTE — Telephone Encounter (Signed)
Spoke with pt's daughter and was able to r/s 8/10 appt - sarah out

## 2021-09-16 ENCOUNTER — Encounter: Payer: Self-pay | Admitting: *Deleted

## 2021-09-16 ENCOUNTER — Other Ambulatory Visit: Payer: Self-pay | Admitting: Neurology

## 2021-09-16 NOTE — Telephone Encounter (Signed)
Rx refilled.

## 2021-09-24 ENCOUNTER — Telehealth: Payer: Self-pay

## 2021-09-24 NOTE — Telephone Encounter (Signed)
Received handicap placard. Reviewed chart pt previous pt of Dr. Anne Hahn he completed in 2021 and has been f/u with SS, NP mainly. JM, NP evaluated pt on 04/30/2021 by Shanda Bumps and will have next f/u with Huntley Dec in August. Will discuss with Shanda Bumps if she is ok with singing.

## 2021-09-24 NOTE — Telephone Encounter (Signed)
Discussed with Shanda Bumps and she was agreeable to completing and wrote for permament card. Provided completed form to medical records for processing copy made for filling purposes as well.

## 2021-09-27 ENCOUNTER — Other Ambulatory Visit: Payer: Self-pay | Admitting: Adult Health

## 2021-09-28 ENCOUNTER — Other Ambulatory Visit: Payer: Medicare HMO | Admitting: Hospice

## 2021-09-28 DIAGNOSIS — R531 Weakness: Secondary | ICD-10-CM

## 2021-09-28 DIAGNOSIS — Z515 Encounter for palliative care: Secondary | ICD-10-CM

## 2021-09-28 DIAGNOSIS — I480 Paroxysmal atrial fibrillation: Secondary | ICD-10-CM

## 2021-09-28 DIAGNOSIS — G35 Multiple sclerosis: Secondary | ICD-10-CM

## 2021-10-05 ENCOUNTER — Telehealth: Payer: Self-pay | Admitting: Neurology

## 2021-10-05 NOTE — Telephone Encounter (Signed)
Pt daughter is calling and would like to know if a three day supply be call into [facility]. Pt daughter said her prescription need to be increased because, pt was told to take another dose and that's why she is running out before she can get it refilled. Daughter would like a call from the nurse.

## 2021-10-05 NOTE — Telephone Encounter (Signed)
noted 

## 2021-10-05 NOTE — Telephone Encounter (Signed)
Pt daughter came into office to pick up documents.  Stated a prescription being called in or phone call is no longer needed. She has gotten it figured out.

## 2021-10-28 DIAGNOSIS — M8589 Other specified disorders of bone density and structure, multiple sites: Secondary | ICD-10-CM | POA: Diagnosis not present

## 2021-10-28 DIAGNOSIS — D6869 Other thrombophilia: Secondary | ICD-10-CM | POA: Diagnosis not present

## 2021-10-28 DIAGNOSIS — R6889 Other general symptoms and signs: Secondary | ICD-10-CM | POA: Diagnosis not present

## 2021-10-28 DIAGNOSIS — G35 Multiple sclerosis: Secondary | ICD-10-CM | POA: Diagnosis not present

## 2021-10-28 DIAGNOSIS — I4891 Unspecified atrial fibrillation: Secondary | ICD-10-CM | POA: Diagnosis not present

## 2021-10-28 DIAGNOSIS — Z0000 Encounter for general adult medical examination without abnormal findings: Secondary | ICD-10-CM | POA: Diagnosis not present

## 2021-10-28 DIAGNOSIS — E039 Hypothyroidism, unspecified: Secondary | ICD-10-CM | POA: Diagnosis not present

## 2021-10-28 DIAGNOSIS — E7800 Pure hypercholesterolemia, unspecified: Secondary | ICD-10-CM | POA: Diagnosis not present

## 2021-11-03 ENCOUNTER — Ambulatory Visit: Payer: Medicare HMO | Admitting: Neurology

## 2021-11-09 DIAGNOSIS — F411 Generalized anxiety disorder: Secondary | ICD-10-CM | POA: Diagnosis not present

## 2021-11-09 DIAGNOSIS — G500 Trigeminal neuralgia: Secondary | ICD-10-CM | POA: Diagnosis not present

## 2021-11-09 DIAGNOSIS — M6389 Disorders of muscle in diseases classified elsewhere, multiple sites: Secondary | ICD-10-CM | POA: Diagnosis not present

## 2021-11-09 DIAGNOSIS — I10 Essential (primary) hypertension: Secondary | ICD-10-CM | POA: Diagnosis not present

## 2021-11-09 DIAGNOSIS — G35 Multiple sclerosis: Secondary | ICD-10-CM | POA: Diagnosis not present

## 2021-11-09 DIAGNOSIS — I4891 Unspecified atrial fibrillation: Secondary | ICD-10-CM | POA: Diagnosis not present

## 2021-11-09 DIAGNOSIS — N3281 Overactive bladder: Secondary | ICD-10-CM | POA: Diagnosis not present

## 2021-11-09 DIAGNOSIS — M67432 Ganglion, left wrist: Secondary | ICD-10-CM | POA: Diagnosis not present

## 2021-11-09 DIAGNOSIS — L853 Xerosis cutis: Secondary | ICD-10-CM | POA: Diagnosis not present

## 2021-11-12 ENCOUNTER — Ambulatory Visit: Payer: Medicare HMO | Admitting: Neurology

## 2021-11-12 ENCOUNTER — Telehealth: Payer: Medicare HMO | Admitting: Hospice

## 2021-11-19 ENCOUNTER — Other Ambulatory Visit: Payer: Self-pay | Admitting: Neurology

## 2021-12-03 DIAGNOSIS — N312 Flaccid neuropathic bladder, not elsewhere classified: Secondary | ICD-10-CM | POA: Diagnosis not present

## 2021-12-03 DIAGNOSIS — R6889 Other general symptoms and signs: Secondary | ICD-10-CM | POA: Diagnosis not present

## 2021-12-03 DIAGNOSIS — N130 Hydronephrosis with ureteropelvic junction obstruction: Secondary | ICD-10-CM | POA: Diagnosis not present

## 2021-12-03 DIAGNOSIS — R300 Dysuria: Secondary | ICD-10-CM | POA: Diagnosis not present

## 2021-12-23 ENCOUNTER — Ambulatory Visit: Payer: Medicare HMO | Admitting: Neurology

## 2021-12-23 ENCOUNTER — Encounter: Payer: Self-pay | Admitting: Neurology

## 2021-12-23 VITALS — BP 156/82 | HR 87 | Ht 65.0 in

## 2021-12-23 DIAGNOSIS — G35 Multiple sclerosis: Secondary | ICD-10-CM | POA: Diagnosis not present

## 2021-12-23 DIAGNOSIS — R6889 Other general symptoms and signs: Secondary | ICD-10-CM | POA: Diagnosis not present

## 2021-12-23 DIAGNOSIS — G500 Trigeminal neuralgia: Secondary | ICD-10-CM

## 2021-12-23 MED ORDER — OXCARBAZEPINE 300 MG PO TABS: 300.0000 mg | ORAL_TABLET | Freq: Four times a day (QID) | ORAL | 3 refills | Status: DC

## 2021-12-23 MED ORDER — DIMETHYL FUMARATE 240 MG PO CPDR: 1.0000 | DELAYED_RELEASE_CAPSULE | Freq: Two times a day (BID) | ORAL | 5 refills | Status: DC

## 2021-12-23 NOTE — Patient Instructions (Signed)
Check labs today You will see Dr. Leta Baptist at next visit  Try to cut back gabapentin to 3 times a day

## 2021-12-23 NOTE — Progress Notes (Signed)
Patient: Jillian Fisher Date of Birth: [birth year]  Reason for Visit: Follow up History from: Patient, daughter Primary Neurologist: [facility]   ASSESSMENT AND PLAN 76 y.o. year old female   40.  Multiple sclerosis, paraparesis -Doing well, condition overall stable -Continue generic Tecfidera -Check routine labs today -She is wheelchair-bound, is nonambulatory -Daughter working on getting home health aide  2.  Right trigeminal neuralgia -Right now under good control -Will continue Trileptal 300 mg 4 times daily, prescription was actually for 3 times daily, but has been doing 4 times daily for quite a while, tolerates this dosing; blood level in January 2023 was 31 -Try to reduce gabapentin 600 mg from 4 times daily to 3 times daily -She will follow-up in 6 months with Dr. Leta Baptist to establish care  HISTORY OF PRESENT ILLNESS: Today 12/23/21 Jillian Fisher is here today for follow-up. Is on generic Tecfidera, no issues. Has a hoyer lift, is no ambulatory, doesn't stand. Is essentially total care. Is incontinent. Lives with her daughter. Is working with Education officer, museum to see about getting in home aide. Had [facility] consult, she didn't qualify. She did have [facility] PT[facility]OT come out for a few months, is now able to sit up better in wheelchair, finished last week. Remains on gabapentin 600 mg 4 times daily, Trileptal 300 mg 4 times daily (prescribed as 3). Right TN is controlled for now. Worries about reducing the doses. Saw PCP for labs 7/26, are not available to me. No open sores. Eating well, sleeping good. No questions or concerns today.   HISTORY  04/30/2021 JM: 76 year old female with history of MS associated with paraparesis and right TN.  Accompanied by her daughter.   In regards to MS, remains on dimethyl fumarate (Tecfidera). Remains nonambulatory and incontinent. Daughter feels right leg has been gradually becoming more weak since prior visit and will lean towards  right while sitting in wheelchair. No other new MS symptoms.  Lives with husband, daughter assists with ADLs. 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    In regards to TN, remains on gabapentin and oxcarbazepine for TN with pain well controlled. Did recently run out of oxcarbazepine with increased pain but since improved after restarting. Per daughter, has been taking oxcarbazepine 300mg  four times daily but prescribed for three times daily, likely the reason they ran out early.  REVIEW OF SYSTEMS: Out of a complete 14 system review of symptoms, the patient complains only of the following symptoms, and all other reviewed systems are negative.  See HPI  ALLERGIES: Allergies  Allergen Reactions   Sulfa Antibiotics Rash    HOME MEDICATIONS: Outpatient Medications Prior to Visit  Medication Sig Dispense Refill   cetirizine (ZYRTEC) 5 MG tablet Take 5 mg by mouth daily.     diclofenac sodium (VOLTAREN) 1 % GEL Apply 2 g topically 4 (four) times daily. (Patient taking differently: Apply 2 g topically as needed.)     diltiazem (CARDIZEM CD) 240 MG 24 hr capsule TAKE 1 CAPSULE EVERY DAY 90 capsule 3   Dimethyl Fumarate 240 MG CPDR TAKE 1 CAPSULE BY MOUTH TWO TIMES DAILY 60 capsule 5   ELIQUIS 5 MG TABS tablet TAKE 1 TABLET TWICE DAILY 180 tablet 2   gabapentin (NEURONTIN) 600 MG tablet TAKE 1 TABLET IN THE MORNING, AT NOON, IN THE EVENING, AND  AT BEDTIME. 360 tablet 3   levothyroxine (SYNTHROID, LEVOTHROID) 125 MCG tablet Take 125 mcg by mouth daily before breakfast.   3   metoprolol tartrate (LOPRESSOR) 25 MG tablet TAKE 1 TABLET TWICE DAILY 180 tablet 1   Multiple Vitamin (MULTIVITAMIN WITH MINERALS) TABS Take 1 tablet by mouth daily.     omeprazole (PRILOSEC) 20 MG capsule Take 20 mg by mouth daily as needed  (reflux).      Oxcarbazepine (TRILEPTAL) 300 MG tablet TAKE 1 TABLET IN THE MORNING, AT NOON, AND AT BEDTIME. 270 tablet 3   rosuvastatin (CRESTOR) 20 MG tablet Take 10 mg by mouth daily.      tamsulosin (FLOMAX) 0.4 MG CAPS capsule daily.     No facility-administered medications prior to visit.    PAST MEDICAL HISTORY: Past Medical History:  Diagnosis Date   Abnormality of gait 01/25/2013   Arthritis    GERD (gastroesophageal reflux disease)    Hypercholesteremia    Hypothyroidism    Multiple sclerosis (HCC)    Neurogenic bladder 01/25/2013   Trigeminal neuralgia 07/07/2017   Right V3    PAST SURGICAL HISTORY: Past Surgical History:  Procedure Laterality Date   ABDOMINAL HYSTERECTOMY      FAMILY HISTORY: Family History  Problem Relation Age of Onset   Heart attack Mother    Multiple sclerosis Sister    Heart disease Sister    Diabetes Other     SOCIAL HISTORY: Social History   Socioeconomic History   Marital status: Married    Spouse name: james   Number of children: 2   Years of education: 8 th   Highest education level: Not on file  Occupational History   Occupation: retired  Tobacco Use   Smoking status: Former    Packs/day: 0.25    Years: 1.00    Total pack years: 0.25    Types: Cigarettes    Quit date: 08/10/1988    Years since quitting: 33.3   Smokeless tobacco: Never   Tobacco comments:    quit 30-40 years ago (documented in 2013)  Vaping Use   Vaping Use: Never used  Substance and Sexual Activity   Alcohol use: No    Alcohol/week: 0.0 standard drinks of alcohol   Drug use: No   Sexual activity: Not on file  Other Topics Concern   Not on file  Social History Narrative   Patient lives at home with her husband.   Retired.   Education 8 th grade   Right handed.   Caffeine none.   Social Determinants of Health   Financial Resource Strain: Not on file  Food Insecurity: Not on file  Transportation Needs: Not on file  Physical Activity: Not  on file  Stress: Not on file  Social Connections: Not on file  Intimate Partner Violence: Not on file    PHYSICAL EXAM  Vitals:   12/23/21 0925  BP: (!) 156/82  Pulse: 87  Height: 5\' 5"  (1.651 m)   Body mass index is 27.62 kg/m.  Generalized: Well developed, in no acute distress  Neurological examination  Mentation: Alert oriented to time, place, most history is provided by her daughter. Follows all commands speech and language fluent. Patient is smiling, quiet, soft-spoken. Cranial nerve II-XII: Pupils were equal round reactive to light. Extraocular movements were full, visual field were full on confrontational test. Facial sensation and strength were normal.  Asymmetry of left mouth.  Head turning and shoulder shrug  were normal and symmetric. Motor: Increased  tone in the lower extremities, no antigravity movement, with upper extremities 3/5 strength, grip strengths weak but equal Sensory: Sensory testing is intact to soft touch on all 4 extremities. No evidence of extinction is noted.  Coordination: With finger-nose-finger some dysmetria with the right, okay with the left; cannot perform heel to shin with either side Gait and station: Is wheelchair bound Reflexes: Deep tendon reflexes are symmetric but depressed  DIAGNOSTIC DATA (LABS, IMAGING, TESTING) - I reviewed patient records, labs, notes, testing and imaging myself where available.  Lab Results  Component Value Date   WBC 5.8 04/30/2021   HGB 12.5 04/30/2021   HCT 36.2 04/30/2021   MCV 91 04/30/2021   PLT 228 04/30/2021      Component Value Date/Time   NA 148 (H) 04/30/2021 1154   K 3.1 (L) 04/30/2021 1154   CL 106 04/30/2021 1154   CO2 26 04/30/2021 1154   GLUCOSE 141 (H) 04/30/2021 1154   GLUCOSE 127 (H) 09/08/2017 0822   BUN 9 04/30/2021 1154   CREATININE 0.67 04/30/2021 1154   CALCIUM 9.5 04/30/2021 1154   PROT 7.3 04/30/2021 1154   ALBUMIN 4.2 04/30/2021 1154   AST 25 04/30/2021 1154   ALT 22  04/30/2021 1154   ALKPHOS 63 04/30/2021 1154   BILITOT 0.2 04/30/2021 1154   GFRNONAA 82 02/14/2020 0935   GFRAA 94 02/14/2020 0935   Lab Results  Component Value Date   CHOL 170 10/26/2018   HDL 66 10/26/2018   LDLCALC 93 10/26/2018   TRIG 53 10/26/2018   CHOLHDL 2.6 10/26/2018   No results found for: "HGBA1C" No results found for: "VITAMINB12" Lab Results  Component Value Date   TSH 5.810 (H) 10/26/2018    Butler Denmark, AGNP-C, DNP 12/23/2021, 9:39 AM [facility] [address], [place], [place] [address] [phone]

## 2021-12-24 LAB — COMPREHENSIVE METABOLIC PANEL
ALT: 27 IU/L (ref 0–32)
AST: 28 IU/L (ref 0–40)
Albumin/Globulin Ratio: 1.4 (ref 1.2–2.2)
Albumin: 4.5 g/dL (ref 3.8–4.8)
Alkaline Phosphatase: 71 IU/L (ref 44–121)
BUN/Creatinine Ratio: 20 (ref 12–28)
BUN: 13 mg/dL (ref 8–27)
Bilirubin Total: 0.4 mg/dL (ref 0.0–1.2)
CO2: 26 mmol/L (ref 20–29)
Calcium: 9.8 mg/dL (ref 8.7–10.3)
Chloride: 104 mmol/L (ref 96–106)
Creatinine, Ser: 0.66 mg/dL (ref 0.57–1.00)
Globulin, Total: 3.2 g/dL (ref 1.5–4.5)
Glucose: 101 mg/dL — ABNORMAL HIGH (ref 70–99)
Potassium: 4.5 mmol/L (ref 3.5–5.2)
Sodium: 143 mmol/L (ref 134–144)
Total Protein: 7.7 g/dL (ref 6.0–8.5)
eGFR: 91 mL/min/{1.73_m2} (ref >59)

## 2021-12-24 LAB — CBC WITH DIFFERENTIAL/PLATELET
Basophils Absolute: 0 10*3/uL (ref 0.0–0.2)
Basos: 1 %
EOS (ABSOLUTE): 0.2 10*3/uL (ref 0.0–0.4)
Eos: 5 %
Hematocrit: 39.6 % (ref 34.0–46.6)
Hemoglobin: 13.2 g/dL (ref 11.1–15.9)
Immature Grans (Abs): 0 10*3/uL (ref 0.0–0.1)
Immature Granulocytes: 0 %
Lymphocytes Absolute: 1.7 10*3/uL (ref 0.7–3.1)
Lymphs: 41 %
MCH: 31.4 pg (ref 26.6–33.0)
MCHC: 33.3 g/dL (ref 31.5–35.7)
MCV: 94 fL (ref 79–97)
Monocytes Absolute: 0.4 10*3/uL (ref 0.1–0.9)
Monocytes: 10 %
Neutrophils Absolute: 1.8 10*3/uL (ref 1.4–7.0)
Neutrophils: 43 %
Platelets: 202 10*3/uL (ref 150–450)
RBC: 4.2 x10E6/uL (ref 3.77–5.28)
RDW: 13.3 % (ref 11.7–15.4)
WBC: 4 10*3/uL (ref 3.4–10.8)

## 2022-01-12 ENCOUNTER — Telehealth: Payer: Self-pay | Admitting: *Deleted

## 2022-01-12 NOTE — Telephone Encounter (Signed)
Pt daughter p/u copy of the [place] dept form

## 2022-03-26 ENCOUNTER — Encounter: Payer: Self-pay | Admitting: Neurology

## 2022-06-28 ENCOUNTER — Ambulatory Visit: Payer: Medicare HMO | Admitting: Diagnostic Neuroimaging

## 2022-06-29 ENCOUNTER — Telehealth: Payer: Self-pay

## 2022-06-29 NOTE — Patient Outreach (Signed)
  Care Coordination   06/29/2022 Name: Jillian Fisher MRN: [id number] DOB: [birth year]   Care Coordination Outreach Attempts:  An unsuccessful telephone outreach was attempted today to offer the patient information about available care coordination services as a benefit of their health plan.   Follow Up Plan:  Additional outreach attempts will be made to offer the patient care coordination information and services.   Encounter Outcome:  No Answer   Care Coordination Interventions:  No, not indicated    SIG  Peter Garter RN, BSN,CCM, CDE Care Management Coordinator [facility] [phone]

## 2022-07-03 ENCOUNTER — Other Ambulatory Visit: Payer: Self-pay | Admitting: Neurology

## 2022-07-03 DIAGNOSIS — G35 Multiple sclerosis: Secondary | ICD-10-CM

## 2022-07-09 ENCOUNTER — Telehealth: Payer: Self-pay

## 2022-07-09 NOTE — Patient Outreach (Signed)
  Care Coordination   07/09/2022 Name: Jillian Fisher MRN: [id number] DOB: Sep 17, 1945   Care Coordination Outreach Attempts:  A second unsuccessful outreach was attempted today to offer the patient with information about available care coordination services as a benefit of their health plan.     Follow Up Plan:  Additional outreach attempts will be made to offer the patient care coordination information and services.   Encounter Outcome:  No Answer   Care Coordination Interventions:  No, not indicated    SIG  Dudley Major RN, BSN,CCM, CDE Care Management Coordinator [facility] [phone]

## 2022-07-21 ENCOUNTER — Telehealth: Payer: Self-pay

## 2022-07-21 NOTE — Patient Outreach (Signed)
  Care Coordination   07/21/2022 Name: Jillian Fisher MRN: [id number] DOB: Jan 16, 1946   Care Coordination Outreach Attempts:  A third unsuccessful outreach was attempted today to offer the patient with information about available care coordination services as a benefit of their health plan.   Follow Up Plan:  No further outreach attempts will be made at this time. We have been unable to contact the patient to offer or enroll patient in care coordination services  Encounter Outcome:  No Answer   Care Coordination Interventions:  No, not indicated    SIG  Dudley Major RN, BSN,CCM, CDE Care Management Coordinator [facility] [phone]

## 2022-11-08 DIAGNOSIS — Z0000 Encounter for general adult medical examination without abnormal findings: Secondary | ICD-10-CM | POA: Diagnosis not present

## 2022-11-08 DIAGNOSIS — E7800 Pure hypercholesterolemia, unspecified: Secondary | ICD-10-CM | POA: Diagnosis not present

## 2022-11-08 DIAGNOSIS — R6889 Other general symptoms and signs: Secondary | ICD-10-CM | POA: Diagnosis not present

## 2022-11-08 DIAGNOSIS — E039 Hypothyroidism, unspecified: Secondary | ICD-10-CM | POA: Diagnosis not present

## 2022-11-08 DIAGNOSIS — G35 Multiple sclerosis: Secondary | ICD-10-CM | POA: Diagnosis not present

## 2022-11-08 DIAGNOSIS — F411 Generalized anxiety disorder: Secondary | ICD-10-CM | POA: Diagnosis not present

## 2022-11-08 DIAGNOSIS — I4891 Unspecified atrial fibrillation: Secondary | ICD-10-CM | POA: Diagnosis not present

## 2022-11-08 DIAGNOSIS — Z1159 Encounter for screening for other viral diseases: Secondary | ICD-10-CM | POA: Diagnosis not present

## 2022-11-08 DIAGNOSIS — I10 Essential (primary) hypertension: Secondary | ICD-10-CM | POA: Diagnosis not present

## 2022-11-08 DIAGNOSIS — M8588 Other specified disorders of bone density and structure, other site: Secondary | ICD-10-CM | POA: Diagnosis not present

## 2022-11-08 DIAGNOSIS — D6869 Other thrombophilia: Secondary | ICD-10-CM | POA: Diagnosis not present

## 2022-11-27 ENCOUNTER — Other Ambulatory Visit: Payer: Self-pay | Admitting: Neurology

## 2022-12-14 DIAGNOSIS — Z1211 Encounter for screening for malignant neoplasm of colon: Secondary | ICD-10-CM | POA: Diagnosis not present

## 2022-12-28 ENCOUNTER — Encounter: Payer: Self-pay | Admitting: Diagnostic Neuroimaging

## 2022-12-28 ENCOUNTER — Ambulatory Visit: Payer: Medicare HMO | Admitting: Diagnostic Neuroimaging

## 2022-12-28 VITALS — BP 188/76 | HR 61 | Ht 63.0 in | Wt 153.0 lb

## 2022-12-28 DIAGNOSIS — G35 Multiple sclerosis: Secondary | ICD-10-CM | POA: Diagnosis not present

## 2022-12-28 DIAGNOSIS — R6889 Other general symptoms and signs: Secondary | ICD-10-CM | POA: Diagnosis not present

## 2022-12-28 MED ORDER — OXCARBAZEPINE 300 MG PO TABS: 300.0000 mg | ORAL_TABLET | Freq: Four times a day (QID) | ORAL | 4 refills | Status: DC

## 2022-12-28 MED ORDER — DIMETHYL FUMARATE 240 MG PO CPDR: 240.0000 mg | DELAYED_RELEASE_CAPSULE | Freq: Two times a day (BID) | ORAL | 4 refills | Status: DC

## 2022-12-28 MED ORDER — GABAPENTIN 600 MG PO TABS: 600.0000 mg | ORAL_TABLET | Freq: Four times a day (QID) | ORAL | 4 refills | Status: DC

## 2022-12-28 NOTE — Progress Notes (Addendum)
 [facility]  PATIENT: Jillian Fisher DOB: [birth year]  REFERRING CLINICIAN: Clovis Riley, L.August Saucer, MD HISTORY FROM: patient and daughter REASON FOR VISIT: new consult   HISTORICAL  CHIEF COMPLAINT:  Chief Complaint  Patient presents with   Follow-up    Patient in room #7 with her daughter. Patient states she is well and stable, with no new concerns    HISTORY OF PRESENT ILLNESS:   UPDATE (12/28/22, VRP): Since last visit, doing well. Overall stable. No new issues. Tolerating meds. Some tremors.   UPDATE (12/23/21, SS): "Ms. Saggese is here today for follow-up. Is on generic Tecfidera, no issues. Has a hoyer lift, is no ambulatory, doesn't stand. Is essentially total care. Is incontinent. Lives with her daughter. Is working with Child psychotherapist to see about getting in home aide. Had [facility] consult, she didn't qualify. She did have [facility] PT/OT come out for a few months, is now able to sit up better in wheelchair, finished last week. Remains on gabapentin 600 mg 4 times daily, Trileptal 300 mg 4 times daily (prescribed as 3). Right TN is controlled for now. Worries about reducing the doses. Saw PCP for labs 7/26, are not available to me. No open sores. Eating well, sleeping good. No questions or concerns today."    REVIEW OF SYSTEMS: Full 14 system review of systems performed and negative with exception of: as per HPI.   ALLERGIES: Allergies  Allergen Reactions   Sulfa Antibiotics Rash    HOME MEDICATIONS: Outpatient Medications Prior to Visit  Medication Sig Dispense Refill   B Complex Vitamins (VITAMIN B-COMPLEX) TABS Take 1 tablet by mouth daily.     cetirizine (ZYRTEC) 5 MG tablet Take 5 mg by mouth daily.     diltiazem (CARDIZEM CD) 240 MG 24 hr capsule TAKE 1 CAPSULE EVERY DAY 90 capsule 3   ELIQUIS 5 MG TABS tablet TAKE 1 TABLET TWICE DAILY 180 tablet 2   levothyroxine (SYNTHROID, LEVOTHROID) 125 MCG tablet Take 125 mcg by mouth daily before  breakfast.   3   metoprolol tartrate (LOPRESSOR) 25 MG tablet TAKE 1 TABLET TWICE DAILY 180 tablet 1   Multiple Vitamin (MULTIVITAMIN WITH MINERALS) TABS Take 1 tablet by mouth daily.     omeprazole (PRILOSEC) 20 MG capsule Take 20 mg by mouth daily as needed (reflux).      rosuvastatin (CRESTOR) 20 MG tablet Take 10 mg by mouth daily.      tamsulosin (FLOMAX) 0.4 MG CAPS capsule daily.     Dimethyl Fumarate 240 MG CPDR TAKE 1 CAPSULE (240MG ) BY MOUTH TWICE A DAY 60 capsule 4   gabapentin (NEURONTIN) 600 MG tablet TAKE 1 TABLET IN THE MORNING, AT NOON, IN THE EVENING, AND AT BEDTIME. 360 tablet 3   Oxcarbazepine (TRILEPTAL) 300 MG tablet TAKE 1 TABLET FOUR TIMES DAILY 360 tablet 3   diclofenac sodium (VOLTAREN) 1 % GEL Apply 2 g topically 4 (four) times daily. (Patient not taking: Reported on 12/28/2022)     No facility-administered medications prior to visit.      PHYSICAL EXAM  GENERAL EXAM/CONSTITUTIONAL: Vitals:  Vitals:   12/28/22 1544  BP: (!) 188/76  Pulse: 61  Weight: 153 lb (69.4 kg)  Height: 5\' 3"  (1.6 m)   Body mass index is 27.1 kg/m. Wt Readings from Last 3 Encounters:  12/28/22 153 lb (69.4 kg)  08/13/20 166 lb (75.3 kg)  02/14/20 166 lb (75.3 kg)   Patient is in no distress; well developed, nourished and groomed;  neck is supple  CARDIOVASCULAR: Examination of carotid arteries is normal; no carotid bruits Regular rate and rhythm, no murmurs Examination of peripheral vascular system by observation and palpation is normal  EYES: Ophthalmoscopic exam of optic discs and posterior segments is normal; no papilledema or hemorrhages No results found.  MUSCULOSKELETAL: Gait, strength, tone, movements noted in Neurologic exam below  NEUROLOGIC: MENTAL STATUS:      No data to display         awake, alert, oriented to person, place and time recent and remote memory intact normal attention and concentration language fluent, comprehension intact, naming  intact fund of knowledge appropriate  CRANIAL NERVE:  2nd - no papilledema on fundoscopic exam 2nd, 3rd, 4th, 6th - pupils equal and reactive to light, visual fields full to confrontation, extraocular muscles intact, no nystagmus 5th - facial sensation symmetric 7th - facial strength symmetric 8th - hearing intact 9th - palate elevates symmetrically, uvula midline 11th - shoulder shrug symmetric 12th - tongue protrusion midline  MOTOR:  RUE 3; LLE 4 INCREASED TONE IN BLE; HIP FLEX 2; KE/KF 2; DF / TOES 1-2 normal bulk and tone, full strength in the BUE, BLE  SENSORY:  normal and symmetric to light touch; DECR IN FEET / ANKLES  COORDINATION:  finger-nose-finger, fine finger movements SLOW  REFLEXES:  deep tendon reflexes 1+ and symmetric  GAIT/STATION:  IN WHEELCHAIR     DIAGNOSTIC DATA (LABS, IMAGING, TESTING) - I reviewed patient records, labs, notes, testing and imaging myself where available.  Lab Results  Component Value Date   WBC 4.0 12/23/2021   HGB 13.2 12/23/2021   HCT 39.6 12/23/2021   MCV 94 12/23/2021   PLT 202 12/23/2021      Component Value Date/Time   NA 143 12/23/2021 0959   K 4.5 12/23/2021 0959   CL 104 12/23/2021 0959   CO2 26 12/23/2021 0959   GLUCOSE 101 (H) 12/23/2021 0959   GLUCOSE 127 (H) 09/08/2017 0822   BUN 13 12/23/2021 0959   CREATININE 0.66 12/23/2021 0959   CALCIUM 9.8 12/23/2021 0959   PROT 7.7 12/23/2021 0959   ALBUMIN 4.5 12/23/2021 0959   AST 28 12/23/2021 0959   ALT 27 12/23/2021 0959   ALKPHOS 71 12/23/2021 0959   BILITOT 0.4 12/23/2021 0959   GFRNONAA 82 02/14/2020 0935   GFRAA 94 02/14/2020 0935   Lab Results  Component Value Date   CHOL 170 10/26/2018   HDL 66 10/26/2018   LDLCALC 93 10/26/2018   TRIG 53 10/26/2018   CHOLHDL 2.6 10/26/2018   No results found for: "HGBA1C" No results found for: "VITAMINB12" Lab Results  Component Value Date   TSH 5.810 (H) 10/26/2018   09/03/17 mri BRAIN  -  Advanced  chronic changes of demyelinating disease affecting the cerebral hemispheric white matter as outlined above. No appreciable change since the previous studies of this year. No acute or reversible process identified.     ASSESSMENT AND PLAN  77 y.o. year old female here with:   Dx:  1. Secondary progressive multiple sclerosis (HCC)   2. Multiple sclerosis (HCC)      PLAN:  Multiple sclerosis, paraparesis -Continue generic Tecfidera (CBC, CMP per PCP) -She is wheelchair-bound, is nonambulatory -has home health aide 3x per week - Patient needs hoyer lift and hospital bed due to her Multiple Sclerosis and is unable to make transfers on there own. She is total care and her previous hoyer lift and hospital bed are not working any  longer. She is non ambulatory and wheelchair bound and a hoyer lift and hospital bed are needed for her care.    Right trigeminal neuralgia -Will continue Trileptal 300 mg 4 times daily, prescription was actually for 3 times daily, but has been doing 4 times daily for quite a while, tolerates this dosing; blood level in January 2023 was 31 -Try to reduce gabapentin 600 mg from 4 times daily to 3 times daily  Meds ordered this encounter  Medications   Oxcarbazepine (TRILEPTAL) 300 MG tablet    Sig: Take 1 tablet (300 mg total) by mouth 4 (four) times daily.    Dispense:  360 tablet    Refill:  4   gabapentin (NEURONTIN) 600 MG tablet    Sig: Take 1 tablet (600 mg total) by mouth in the morning, at noon, in the evening, and at bedtime.    Dispense:  360 tablet    Refill:  4   Dimethyl Fumarate 240 MG CPDR    Sig: Take 1 capsule (240 mg total) by mouth in the morning and at bedtime.    Dispense:  180 capsule    Refill:  4   Return in about 1 year (around 12/28/2023) for with NP Margie Ege).    Suanne Marker, MD 12/28/2022, 4:00 PM Certified in Neurology, Neurophysiology and Neuroimaging  [facility] [address] [place], [place] [address] [phone]

## 2023-02-14 ENCOUNTER — Emergency Department (HOSPITAL_COMMUNITY)
Admission: EM | Admit: 2023-02-14 | Discharge: 2023-02-14 | Disposition: A | Discharge status: Home / Self Care | Payer: Medicare HMO | Attending: Emergency Medicine | Admitting: Emergency Medicine

## 2023-02-14 ENCOUNTER — Emergency Department (HOSPITAL_COMMUNITY): Payer: Medicare HMO

## 2023-02-14 ENCOUNTER — Other Ambulatory Visit: Payer: Self-pay

## 2023-02-14 ENCOUNTER — Encounter (HOSPITAL_COMMUNITY): Payer: Self-pay

## 2023-02-14 DIAGNOSIS — J9811 Atelectasis: Secondary | ICD-10-CM | POA: Diagnosis not present

## 2023-02-14 DIAGNOSIS — R531 Weakness: Secondary | ICD-10-CM | POA: Diagnosis not present

## 2023-02-14 DIAGNOSIS — Z7401 Bed confinement status: Secondary | ICD-10-CM | POA: Diagnosis not present

## 2023-02-14 DIAGNOSIS — E039 Hypothyroidism, unspecified: Secondary | ICD-10-CM | POA: Diagnosis not present

## 2023-02-14 DIAGNOSIS — R300 Dysuria: Secondary | ICD-10-CM | POA: Diagnosis not present

## 2023-02-14 DIAGNOSIS — Z87891 Personal history of nicotine dependence: Secondary | ICD-10-CM | POA: Insufficient documentation

## 2023-02-14 DIAGNOSIS — I6782 Cerebral ischemia: Secondary | ICD-10-CM | POA: Diagnosis not present

## 2023-02-14 DIAGNOSIS — I4891 Unspecified atrial fibrillation: Secondary | ICD-10-CM | POA: Insufficient documentation

## 2023-02-14 DIAGNOSIS — Z7901 Long term (current) use of anticoagulants: Secondary | ICD-10-CM | POA: Insufficient documentation

## 2023-02-14 DIAGNOSIS — K449 Diaphragmatic hernia without obstruction or gangrene: Secondary | ICD-10-CM | POA: Diagnosis not present

## 2023-02-14 DIAGNOSIS — Z79899 Other long term (current) drug therapy: Secondary | ICD-10-CM | POA: Insufficient documentation

## 2023-02-14 DIAGNOSIS — R109 Unspecified abdominal pain: Secondary | ICD-10-CM | POA: Diagnosis not present

## 2023-02-14 DIAGNOSIS — N3001 Acute cystitis with hematuria: Secondary | ICD-10-CM | POA: Diagnosis not present

## 2023-02-14 DIAGNOSIS — R410 Disorientation, unspecified: Secondary | ICD-10-CM | POA: Diagnosis not present

## 2023-02-14 DIAGNOSIS — I517 Cardiomegaly: Secondary | ICD-10-CM | POA: Diagnosis not present

## 2023-02-14 DIAGNOSIS — I10 Essential (primary) hypertension: Secondary | ICD-10-CM | POA: Insufficient documentation

## 2023-02-14 DIAGNOSIS — R001 Bradycardia, unspecified: Secondary | ICD-10-CM | POA: Diagnosis not present

## 2023-02-14 DIAGNOSIS — I700 Atherosclerosis of aorta: Secondary | ICD-10-CM | POA: Diagnosis not present

## 2023-02-14 DIAGNOSIS — R5383 Other fatigue: Secondary | ICD-10-CM | POA: Diagnosis not present

## 2023-02-14 LAB — TROPONIN I (HIGH SENSITIVITY)
Troponin I (High Sensitivity): 7 ng/L (ref <18)
Troponin I (High Sensitivity): 6 ng/L (ref <18)

## 2023-02-14 LAB — COMPREHENSIVE METABOLIC PANEL
ALT: 27 U/L (ref 0–44)
AST: 39 U/L (ref 15–41)
Albumin: 3.4 g/dL — ABNORMAL LOW (ref 3.5–5.0)
Alkaline Phosphatase: 58 U/L (ref 38–126)
Anion gap: 11 (ref 5–15)
BUN: 9 mg/dL (ref 8–23)
CO2: 23 mmol/L (ref 22–32)
Calcium: 8.9 mg/dL (ref 8.9–10.3)
Chloride: 105 mmol/L (ref 98–111)
Creatinine, Ser: 0.71 mg/dL (ref 0.44–1.00)
GFR, Estimated: >60 mL/min (ref >60)
Glucose, Bld: 109 mg/dL — ABNORMAL HIGH (ref 70–99)
Potassium: 4.1 mmol/L (ref 3.5–5.1)
Sodium: 139 mmol/L (ref 135–145)
Total Bilirubin: 0.3 mg/dL (ref <1.2)
Total Protein: 7.2 g/dL (ref 6.5–8.1)

## 2023-02-14 LAB — URINALYSIS, ROUTINE W REFLEX MICROSCOPIC
Bacteria, UA: NONE SEEN
Bilirubin Urine: NEGATIVE
Glucose, UA: NEGATIVE mg/dL
Ketones, ur: NEGATIVE mg/dL
Nitrite: NEGATIVE
Protein, ur: 100 mg/dL — AB
Specific Gravity, Urine: 1.023 (ref 1.005–1.030)
WBC, UA: >50 WBC/hpf (ref 0–5)
pH: 6 (ref 5.0–8.0)

## 2023-02-14 LAB — TSH: TSH: 2.179 u[IU]/mL (ref 0.350–4.500)

## 2023-02-14 LAB — CBC WITH DIFFERENTIAL/PLATELET
Abs Immature Granulocytes: 0.02 10*3/uL (ref 0.00–0.07)
Basophils Absolute: 0 10*3/uL (ref 0.0–0.1)
Basophils Relative: 1 %
Eosinophils Absolute: 0.3 10*3/uL (ref 0.0–0.5)
Eosinophils Relative: 7 %
HCT: 36 % (ref 36.0–46.0)
Hemoglobin: 11.7 g/dL — ABNORMAL LOW (ref 12.0–15.0)
Immature Granulocytes: 0 %
Lymphocytes Relative: 39 %
Lymphs Abs: 1.8 10*3/uL (ref 0.7–4.0)
MCH: 31.5 pg (ref 26.0–34.0)
MCHC: 32.5 g/dL (ref 30.0–36.0)
MCV: 97 fL (ref 80.0–100.0)
Monocytes Absolute: 0.4 10*3/uL (ref 0.1–1.0)
Monocytes Relative: 9 %
Neutro Abs: 2 10*3/uL (ref 1.7–7.7)
Neutrophils Relative %: 44 %
Platelets: 224 10*3/uL (ref 150–400)
RBC: 3.71 MIL/uL — ABNORMAL LOW (ref 3.87–5.11)
RDW: 14.1 % (ref 11.5–15.5)
WBC: 4.6 10*3/uL (ref 4.0–10.5)
nRBC: 0 % (ref 0.0–0.2)

## 2023-02-14 LAB — T4, FREE: Free T4: 0.81 ng/dL (ref 0.61–1.12)

## 2023-02-14 LAB — MAGNESIUM: Magnesium: 1.9 mg/dL (ref 1.7–2.4)

## 2023-02-14 MED ORDER — CEPHALEXIN 500 MG PO CAPS: 500.0000 mg | ORAL_CAPSULE | Freq: Three times a day (TID) | ORAL | 0 refills | Status: AC

## 2023-02-14 MED ORDER — SODIUM CHLORIDE 0.9 % IV SOLN
1.0000 g | Freq: Once | INTRAVENOUS | Status: AC
  Administered 2023-02-14: 1 g via INTRAVENOUS
  Filled 2023-02-14: qty 10

## 2023-02-14 MED ORDER — SODIUM CHLORIDE 0.9 % IV BOLUS
1000.0000 mL | Freq: Once | INTRAVENOUS | Status: AC
Start: 2023-02-14 — End: 2023-02-14
  Administered 2023-02-14: 1000 mL via INTRAVENOUS

## 2023-02-14 MED ORDER — HYDRALAZINE HCL 20 MG/ML IJ SOLN
10.0000 mg | Freq: Once | INTRAMUSCULAR | Status: AC
  Administered 2023-02-14: 10 mg via INTRAVENOUS
  Filled 2023-02-14: qty 1

## 2023-02-14 MED ORDER — IOPAMIDOL (ISOVUE-370) INJECTION 76%
75.0000 mL | Freq: Once | INTRAVENOUS | Status: AC | PRN
  Administered 2023-02-14: 75 mL via INTRAVENOUS

## 2023-02-14 NOTE — Discharge Instructions (Addendum)
You have an irregular area in your bladder that needs to be followed up on by your urologist.  Please call them to make an appointment It was a pleasure caring for you today in the emergency department.  Please return to the emergency department for any worsening or worrisome symptoms.

## 2023-02-14 NOTE — ED Notes (Signed)
Ptar called, no eta 

## 2023-02-14 NOTE — ED Notes (Signed)
Lab called to add urine culture. 

## 2023-02-14 NOTE — ED Provider Notes (Signed)
[name] AT [facility] Provider Note  CSN: [id number] Arrival date & time: 02/14/23 1225  Chief Complaint(s) Weakness  HPI Jillian Fisher is a 77 y.o. female with past medical history as below, significant for A-fib on Eliquis, MS, neurogenic bladder, hypothyroid, GERD, HLD who presents to the ED with complaint of generalized weakness.  Patient reports this morning when she got up she felt weak and tired overall.  No focal weakness.  Reports some discomfort with urination.  No abdominal pain chest pain nausea or vomiting.  No recent falls or injuries.  No recent diet or medication changes.  No nausea or vomiting.  No fevers or chills.  Denies any numbness or tingling to her extremities that seems new.  No vision changes or headache.  No difficulty swallowing or speaking.  Home health nurse/aide evaluate patient in morning and heart rate was low, sent to the ED for evaluation.   Past Medical History Past Medical History:  Diagnosis Date   Abnormality of gait 01/25/2013   Arthritis    GERD (gastroesophageal reflux disease)    Hypercholesteremia    Hypothyroidism    Multiple sclerosis (HCC)    Neurogenic bladder 01/25/2013   Trigeminal neuralgia 07/07/2017   Right V3   Patient Active Problem List   Diagnosis Date Noted   Essential hypertension 08/11/2018   Trigeminal neuralgia 07/07/2017   Right leg weakness 02/25/2016   PAF (paroxysmal atrial fibrillation) (HCC) CHA2DS2-VASc Score is 3 with yearly risk of stroke of 3.2%. 02/25/2016   Weakness of right lower extremity 02/25/2016   Gait instability 02/24/2016   Multiple sclerosis (HCC) 01/25/2013   Abnormality of gait 01/25/2013   Neurogenic bladder 01/25/2013   Home Medication(s) Prior to Admission medications   Medication Sig Start Date End Date Taking? Authorizing Provider  cephALEXin (KEFLEX) 500 MG capsule Take 1 capsule (500 mg total) by mouth 3 (three) times daily for 7 days. 02/15/23  02/22/23 Yes Sloan Leiter, DO  cetirizine (ZYRTEC) 5 MG tablet Take 5 mg by mouth daily as needed for allergies.   Yes [provider]  diltiazem (CARDIZEM CD) 240 MG 24 hr capsule TAKE 1 CAPSULE EVERY DAY 02/08/20  Yes Yates Decamp, MD  Dimethyl Fumarate 240 MG CPDR Take 1 capsule (240 mg total) by mouth in the morning and at bedtime. 12/28/22  Yes Penumalli, Glenford Bayley, MD  ELIQUIS 5 MG TABS tablet TAKE 1 TABLET TWICE DAILY 03/10/20  Yes Yates Decamp, MD  gabapentin (NEURONTIN) 600 MG tablet Take 1 tablet (600 mg total) by mouth in the morning, at noon, in the evening, and at bedtime. 12/28/22  Yes Penumalli, Glenford Bayley, MD  levothyroxine (SYNTHROID, LEVOTHROID) 125 MCG tablet Take 125 mcg by mouth daily before breakfast.  12/11/14  Yes [provider]  metoprolol tartrate (LOPRESSOR) 25 MG tablet TAKE 1 TABLET TWICE DAILY 05/13/20  Yes Yates Decamp, MD  Multiple Vitamin (MULTIVITAMIN WITH MINERALS) TABS Take 1 tablet by mouth daily.   Yes [provider]  omeprazole (PRILOSEC) 20 MG capsule Take 20 mg by mouth daily as needed (reflux).    Yes [provider]  Oxcarbazepine (TRILEPTAL) 300 MG tablet Take 1 tablet (300 mg total) by mouth 4 (four) times daily. 12/28/22  Yes Penumalli, Glenford Bayley, MD  rosuvastatin (CRESTOR) 20 MG tablet Take 10 mg by mouth daily.    Yes [provider]  tamsulosin (FLOMAX) 0.4 MG CAPS capsule Take 0.4 mg by mouth daily. 02/09/19  Yes [provider]  B Complex Vitamins (VITAMIN B-COMPLEX) TABS Take 1 tablet by mouth daily. Patient not taking: Reported on 02/14/2023 11/21/19   [provider]                                                                                                                                    Past Surgical History Past Surgical History:  Procedure Laterality Date   ABDOMINAL HYSTERECTOMY     Family History Family History  Problem Relation Age of Onset   Heart attack Mother    Multiple  sclerosis Sister    Heart disease Sister    Diabetes Other     Social History Social History   Tobacco Use   Smoking status: Former    Current packs/day: 0.00    Average packs/day: 0.3 packs/day for 1 year (0.3 ttl pk-yrs)    Types: Cigarettes    Start date: 08/11/1987    Quit date: 08/10/1988    Years since quitting: 34.5   Smokeless tobacco: Never   Tobacco comments:    quit 30-40 years ago (documented in 2013)  Vaping Use   Vaping status: Never Used  Substance Use Topics   Alcohol use: No    Alcohol/week: 0.0 standard drinks of alcohol   Drug use: No   Allergies Sulfamethoxazole-trimethoprim and Sulfa antibiotics  Review of Systems Review of Systems  Constitutional:  Negative for chills and fever.  Respiratory:  Negative for chest tightness and shortness of breath.   Cardiovascular:  Negative for chest pain.  Gastrointestinal:  Negative for abdominal pain and nausea.  Genitourinary:  Positive for dysuria and urgency. Negative for decreased urine volume.  Musculoskeletal:  Negative for arthralgias.  Neurological:  Positive for weakness.  All other systems reviewed and are negative.   Physical Exam Vital Signs  I have reviewed the triage vital signs BP (!) 209/74   Pulse 60   Temp 97.7 F (36.5 C) (Oral)   Resp 17   SpO2 97%  Physical Exam Vitals and nursing note reviewed.  Constitutional:      General: She is not in acute distress.    Appearance: Normal appearance.  HENT:     Head: Normocephalic and atraumatic. No raccoon eyes, Battle's sign, right periorbital erythema or left periorbital erythema.     Right Ear: External ear normal.     Left Ear: External ear normal.     Nose: Nose normal.     Mouth/Throat:     Mouth: Mucous membranes are moist.  Eyes:     General: No scleral icterus.       Right eye: No discharge.        Left eye: No discharge.     Extraocular Movements: Extraocular movements intact.     Pupils: Pupils are equal, round, and reactive  to light.  Cardiovascular:     Rate and Rhythm: Normal rate. Rhythm irregular.  Pulses: Normal pulses.     Heart sounds: Normal heart sounds.  Pulmonary:     Effort: Pulmonary effort is normal. No respiratory distress.     Breath sounds: Normal breath sounds. No stridor.  Abdominal:     General: Abdomen is flat. There is no distension.     Palpations: Abdomen is soft.     Tenderness: There is no abdominal tenderness.  Musculoskeletal:     Cervical back: Normal range of motion. No rigidity.     Right lower leg: No edema.     Left lower leg: No edema.  Skin:    General: Skin is warm and dry.     Capillary Refill: Capillary refill takes less than 2 seconds.  Neurological:     Mental Status: She is alert and oriented to person, place, and time.     GCS: GCS eye subscore is 4. GCS verbal subscore is 5. GCS motor subscore is 6.     Cranial Nerves: Cranial nerves 2-12 are intact.     Sensory: Sensation is intact.     Motor: Motor function is intact.     Coordination: Coordination is intact.     Comments: Gait testing deferred secondary to patient safety.  Strength symmetric to extremities    Psychiatric:        Mood and Affect: Mood normal.        Behavior: Behavior normal. Behavior is cooperative.     ED Results and Treatments Labs (all labs ordered are listed, but only abnormal results are displayed) Labs Reviewed  COMPREHENSIVE METABOLIC PANEL - Abnormal; Notable for the following components:      Result Value   Glucose, Bld 109 (*)    Albumin 3.4 (*)    All other components within normal limits  CBC WITH DIFFERENTIAL/PLATELET - Abnormal; Notable for the following components:   RBC 3.71 (*)    Hemoglobin 11.7 (*)    All other components within normal limits  URINALYSIS, ROUTINE W REFLEX MICROSCOPIC - Abnormal; Notable for the following components:   APPearance HAZY (*)    Hgb urine dipstick SMALL (*)    Protein, ur 100 (*)    Leukocytes,Ua MODERATE (*)    All  other components within normal limits  URINE CULTURE  TSH  T4, FREE  MAGNESIUM  10-HYDROXYCARBAZEPINE  TROPONIN I (HIGH SENSITIVITY)  TROPONIN I (HIGH SENSITIVITY)                                                                                                                          Radiology No results found.  Pertinent labs & imaging results that were available during my care of the patient were reviewed by me and considered in my medical decision making (see MDM for details).  Medications Ordered in ED Medications  cefTRIAXone (ROCEPHIN) 1 g in sodium chloride 0.9 % 100 mL IVPB (1 g Intravenous New Bag/Given 02/14/23 1537)  sodium chloride 0.9 % bolus 1,000 mL (0 mLs Intravenous  Stopped 02/14/23 1400)  hydrALAZINE (APRESOLINE) injection 10 mg (10 mg Intravenous Given 02/14/23 1516)                                                                                                                                     Procedures Procedures  (including critical care time)  Medical Decision Making / ED Course    Medical Decision Making:    SIANY SORCE is a 77 y.o. female with past medical history as below, significant for A-fib on Eliquis, MS, neurogenic bladder, hypothyroid, GERD, HLD who presents to the ED with complaint of generalized weakness.. The complaint involves an extensive differential diagnosis and also carries with it a high risk of complications and morbidity.  Serious etiology was considered. Ddx includes but is not limited to: A-fib, metabolic, endocrine disturbance, medication effect, infectious, ACS, UTI, etc.  Complete initial physical exam performed, notably the patient  was acute distress, slow A-fib noted on telemetry.    Reviewed and confirmed nursing documentation for past medical history, family history, social history.  Vital signs reviewed.    Clinical Course as of 02/14/23 1549  Mon Feb 14, 2023  1342 Bacteria, UA: NONE SEEN Sterile pyuria  [SG]     Clinical Course User Index [SG] Sloan Leiter, DO     Urinalysis concerning for possible infection, will go ahead and send culture and give antibiotics given that she is having some dysuria and urgency.  So far her labs are o/w reassuring.  She did not take her home blood pressure medication and was given dose of hydralazine.  She is pending imaging at time of shift change.  Etiology of her generalized weakness is unclear, possibly secondary to symptomatic atrial fibrillation..  Handoff to incoming EDP pending imaging and recheck                 Additional history obtained: -Additional history obtained from EMS -External records from outside source obtained and reviewed including: Chart review including previous notes, labs, imaging, consultation notes including  Home medications; metoprolol, Eliquis, diltiazem Primary care recommendation prior labs   Lab Tests: -I ordered, reviewed, and interpreted labs.   The pertinent results include:   Labs Reviewed  COMPREHENSIVE METABOLIC PANEL - Abnormal; Notable for the following components:      Result Value   Glucose, Bld 109 (*)    Albumin 3.4 (*)    All other components within normal limits  CBC WITH DIFFERENTIAL/PLATELET - Abnormal; Notable for the following components:   RBC 3.71 (*)    Hemoglobin 11.7 (*)    All other components within normal limits  URINALYSIS, ROUTINE W REFLEX MICROSCOPIC - Abnormal; Notable for the following components:   APPearance HAZY (*)    Hgb urine dipstick SMALL (*)    Protein, ur 100 (*)    Leukocytes,Ua MODERATE (*)    All other components within normal limits  URINE CULTURE  TSH  T4, FREE  MAGNESIUM  10-HYDROXYCARBAZEPINE  TROPONIN I (HIGH SENSITIVITY)  TROPONIN I (HIGH SENSITIVITY)    Notable for ?uti  EKG   EKG Interpretation Date/Time:  Monday February 14 2023 12:34:08 EST Ventricular Rate:  44 PR Interval:    QRS Duration:  93 QT Interval:  470 QTC  Calculation: 402 R Axis:   18  Text Interpretation: Atrial fibrillation Consider left ventricular hypertrophy similar to prior, no stemi Confirmed by Tanda Rockers ([id number]) on 02/14/2023 12:39:14 PM         Imaging Studies ordered: I ordered imaging studies including CTH CTAP CXR > pending imaging    Medicines ordered and prescription drug management: Meds ordered this encounter  Medications   sodium chloride 0.9 % bolus 1,000 mL   hydrALAZINE (APRESOLINE) injection 10 mg   cefTRIAXone (ROCEPHIN) 1 g in sodium chloride 0.9 % 100 mL IVPB    Order Specific Question:   Antibiotic Indication:    Answer:   UTI   cephALEXin (KEFLEX) 500 MG capsule    Sig: Take 1 capsule (500 mg total) by mouth 3 (three) times daily for 7 days.    Dispense:  21 capsule    Refill:  0    -I have reviewed the patients home medicines and have made adjustments as needed   Consultations Obtained: na   Cardiac Monitoring: The patient was maintained on a cardiac monitor.  I personally viewed and interpreted the cardiac monitored which showed an underlying rhythm of: afib slow Continuous pulse oximetry interpreted by myself, 98% on RA.    Social Determinants of Health:  Diagnosis or treatment significantly limited by social determinants of health: former smoker   Reevaluation: After the interventions noted above, I reevaluated the patient and found that they have improved  Co morbidities that complicate the patient evaluation  Past Medical History:  Diagnosis Date   Abnormality of gait 01/25/2013   Arthritis    GERD (gastroesophageal reflux disease)    Hypercholesteremia    Hypothyroidism    Multiple sclerosis (HCC)    Neurogenic bladder 01/25/2013   Trigeminal neuralgia 07/07/2017   Right V3      Dispostion: Disposition decision including need for hospitalization was considered, and patient disposition pending at time of sign out.    Final Clinical Impression(s) / ED Diagnoses Final  diagnoses:  Atrial fibrillation, unspecified type (HCC)  Weakness  Acute cystitis with hematuria        Sloan Leiter, DO 02/14/23 1550

## 2023-02-14 NOTE — ED Provider Notes (Signed)
Patient signed to me by Dr. Wallace Cullens pending results of labs and x-rays.  Urinalysis did show infection.  Was given dose of antibiotics by him for this.  CT per interpretation shows no acute findings.  Patient's CT abdomen pelvis shows cystitis.  Consistent with no UTI.  An irregular area of mucosal hyperenhancement was noted along the right side of the bladder lumen.  This will need to be followed by urology.  I informed patient and her daughter who is at bedside of the need for this.  Will place patient on Keflex for UTI.  Patient states she feels will not to go home   Lorre Nick, MD 02/14/23 2110

## 2023-02-14 NOTE — ED Triage Notes (Signed)
Pt to ED via [facility] from home. Pt c/o generalized weakness since she woke up this morning. Pt also endorses painful urination x a few days. Pt was brady a fib with an HR of 30-50HR with EMS, but states that is her baseline. Pt denies fevers at home.    EMS: 210/80 30-60 HR 18 RR 98% RA 133 CBG 20 LFA

## 2023-02-15 LAB — URINE CULTURE: Culture: 60000 — AB

## 2023-02-16 ENCOUNTER — Telehealth (HOSPITAL_BASED_OUTPATIENT_CLINIC_OR_DEPARTMENT_OTHER): Payer: Self-pay

## 2023-02-16 NOTE — Telephone Encounter (Signed)
Post ED Visit - Positive Culture Follow-up  Culture report reviewed by antimicrobial stewardship pharmacist: [facility] [x]  Enos Fling, Pharm.D. []  Celedonio Miyamoto, [address].D., BCPS AQ-ID []  Garvin Fila, Pharm.D., BCPS []  Georgina Pillion, Pharm.D., BCPS []  [place], [address].D., BCPS, AAHIVP []  Estella Husk, Pharm.D., BCPS, AAHIVP []  Lysle Pearl, PharmD, BCPS []  Phillips Climes, PharmD, BCPS []  Agapito Games, PharmD, BCPS []  Verlan Friends, PharmD []  Mervyn Gay, PharmD, BCPS []  Vinnie Level, PharmD  [facility] []  Len Childs, PharmD []  Greer Pickerel, PharmD []  Adalberto Cole, PharmD []  Perlie Gold, Rph []  Lonell Face) Jean Rosenthal, PharmD []  Earl Many, PharmD []  Junita Push, PharmD []  Dorna Leitz, PharmD []  Terrilee Files, PharmD []  Lynann Beaver, PharmD []  Keturah Barre, PharmD []  Loralee Pacas, PharmD []  Bernadene Person, PharmD   Positive urine culture Treated with Cephalexin, organism sensitive to the same and no further patient follow-up is required at this time.  Sandria Senter 02/16/2023, 9:44 AM

## 2023-02-17 LAB — 10-HYDROXYCARBAZEPINE: Triliptal/MTB(Oxcarbazepin): 45 ug/mL — ABNORMAL HIGH (ref 10–35)

## 2023-05-19 ENCOUNTER — Encounter: Payer: Self-pay | Admitting: Diagnostic Neuroimaging

## 2023-05-19 DIAGNOSIS — G35 Multiple sclerosis: Secondary | ICD-10-CM

## 2023-06-20 NOTE — Telephone Encounter (Signed)
 Followed up with [employer]. Pt received in feb.

## 2023-06-21 ENCOUNTER — Encounter: Payer: Self-pay | Admitting: Diagnostic Neuroimaging

## 2023-06-21 DIAGNOSIS — G35 Multiple sclerosis: Secondary | ICD-10-CM

## 2023-06-22 NOTE — Telephone Encounter (Signed)
 Dr.Penumalli Can you add to last visit notes that patient is needing new hospital bed.  Order is pending for your approval to be signed in this encounter  We can use [employer] she has used this DME in past.

## 2023-06-29 ENCOUNTER — Encounter: Payer: Self-pay | Admitting: Diagnostic Neuroimaging

## 2023-12-12 NOTE — Progress Notes (Unsigned)
 Patient: Jillian Fisher Date of Birth: 10-12-1945  Reason for Visit: Follow up History from: Patient, daughter Primary Neurologist: [facility]   ASSESSMENT AND PLAN 78 y.o. year old female   1.  Multiple sclerosis, paraparesis -Stable, continue dimethyl fumarate  -Check routine labs today -She is wheelchair-bound, is nonambulatory.  Has a hospital bed, needs a new wheelchair with elevated foot rests (DME order written)  2.  Right trigeminal neuralgia -Right now under good control -Try to reduce Trileptal  300 mg from 4 times daily to 3 times daily, recheck level today, was high in Nov when in the ER  - For now, continue gabapentin  600 mg 4 times daily -Will follow up in 1 year via [facility], 30 min slot, has to have transport services, expensive   Orders Placed This Encounter  Procedures   For home use only DME standard manual wheelchair with seat cushion   CBC with Differential/Platelet   CMP   Oxcarbazepine  (Trileptal ), Serum   Meds ordered this encounter  Medications   Dimethyl Fumarate  240 MG CPDR    Sig: Take 1 capsule (240 mg total) by mouth in the morning and at bedtime.    Dispense:  180 capsule    Refill:  4   gabapentin  (NEURONTIN ) 600 MG tablet    Sig: Take 1 tablet (600 mg total) by mouth in the morning, at noon, in the evening, and at bedtime.    Dispense:  360 tablet    Refill:  4   Oxcarbazepine  (TRILEPTAL ) 300 MG tablet    Sig: Take 1 tablet (300 mg total) by mouth 4 (four) times daily.    Dispense:  360 tablet    Refill:  4   HISTORY OF PRESENT ILLNESS: Today 12/13/23 Update 12/13/23 SS: In ER Nov with weakness, had UTI, improved after antibiotics. Trileptal  level was 45. Has home health aide 3 days a week. Remains on dimethyl fumarate , MS is stable. Has hoyer lift, hospital bed. TN flares briefly, few days. Remains on gabapentin  600 mg 4 times daily, Trileptal  300 mg 4 times daily.   UPDATE (12/28/22, VRP): Since last visit, doing well. Overall stable.  No new issues. Tolerating meds. Some tremors.   12/23/21 SS: [name] is here today for follow-up. Is on generic Tecfidera , no issues. Has a hoyer lift, is no ambulatory, doesn't stand. Is essentially total care. Is incontinent. Lives with her daughter. Is working with Child psychotherapist to see about getting in home aide. Had [facility] consult, she didn't qualify. She did have [facility] PT/OT come out for a few months, is now able to sit up better in wheelchair, finished last week. Remains on gabapentin  600 mg 4 times daily, Trileptal  300 mg 4 times daily (prescribed as 3). Right TN is controlled for now. Worries about reducing the doses. Saw PCP for labs 7/26, are not available to me. No open sores. Eating well, sleeping good. No questions or concerns today.   HISTORY  04/30/2021 JM: 78 year old female with history of MS associated with paraparesis and right TN.  Accompanied by her daughter.   In regards to MS, remains on dimethyl fumarate  (Tecfidera ). Remains nonambulatory and incontinent. Daughter feels right leg has been gradually becoming more weak since prior visit and will lean towards right while sitting in wheelchair. No other new MS symptoms.  Lives with husband, daughter assists with ADLs. 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    In regards to TN, remains on gabapentin  and oxcarbazepine  for TN with pain well controlled. Did recently run out of oxcarbazepine  with increased pain but since improved after restarting. Per daughter, has been taking oxcarbazepine  300mg  four times daily but prescribed for three times daily, likely the reason they ran out early.  REVIEW OF SYSTEMS: Out of a complete 14 system review of symptoms, the patient complains only of the following symptoms, and all other reviewed systems  are negative.  See HPI  ALLERGIES: Allergies  Allergen Reactions   Sulfamethoxazole-Trimethoprim     Other Reaction(s): Other (See Comments)   Sulfa Antibiotics Rash    HOME MEDICATIONS: Outpatient Medications Prior to Visit  Medication Sig Dispense Refill   cetirizine (ZYRTEC) 5 MG tablet Take 5 mg by mouth daily as needed for allergies.     diltiazem  (CARDIZEM  CD) 240 MG 24 hr capsule TAKE 1 CAPSULE EVERY DAY 90 capsule 3   ELIQUIS  5 MG TABS tablet TAKE 1 TABLET TWICE DAILY 180 tablet 2   levothyroxine  (SYNTHROID , LEVOTHROID) 125 MCG tablet Take 125 mcg by mouth daily before breakfast.   3   metoprolol  tartrate (LOPRESSOR ) 25 MG tablet TAKE 1 TABLET TWICE DAILY 180 tablet 1   Multiple Vitamin (MULTIVITAMIN WITH MINERALS) TABS Take 1 tablet by mouth daily.     omeprazole (PRILOSEC) 20 MG capsule Take 20 mg by mouth daily as needed (reflux).      rosuvastatin  (CRESTOR ) 20 MG tablet Take 10 mg by mouth daily.      tamsulosin (FLOMAX) 0.4 MG CAPS capsule Take 0.4 mg by mouth daily.     Dimethyl Fumarate  240 MG CPDR Take 1 capsule (240 mg total) by mouth in the morning and at bedtime. 180 capsule 4   gabapentin  (NEURONTIN ) 600 MG tablet Take 1 tablet (600 mg total) by mouth in the morning, at noon, in the evening, and at bedtime. 360 tablet 4   Oxcarbazepine  (TRILEPTAL ) 300 MG tablet Take 1 tablet (300 mg total) by mouth 4 (four) times daily. 360 tablet 4   B Complex Vitamins (VITAMIN B-COMPLEX) TABS Take 1 tablet by mouth daily. (Patient not taking: Reported on 12/13/2023)     No facility-administered medications prior to visit.    PAST MEDICAL HISTORY: Past Medical History:  Diagnosis Date   Abnormality of gait 01/25/2013   Arthritis    GERD (gastroesophageal reflux disease)    Hypercholesteremia    Hypothyroidism    Multiple sclerosis (HCC)    Neurogenic bladder 01/25/2013   Trigeminal neuralgia 07/07/2017   Right V3    PAST SURGICAL HISTORY: Past Surgical History:   Procedure Laterality Date   ABDOMINAL HYSTERECTOMY      FAMILY HISTORY: Family History  Problem Relation Age of Onset   Heart attack Mother    Multiple sclerosis Sister    Heart disease Sister    Diabetes Other     SOCIAL HISTORY: Social History   Socioeconomic History   Marital status: Married    Spouse name: james   Number of children: 2   Years of education: 8 th   Highest education level: Not on file  Occupational History   Occupation: retired  Tobacco Use   Smoking status: Former    Current packs/day: 0.00    Average packs/day: 0.3 packs/day for 1 year (0.3 ttl pk-yrs)    Types: Cigarettes    Start date: 08/11/1987    Quit  date: 08/10/1988    Years since quitting: 35.3   Smokeless tobacco: Never   Tobacco comments:    quit 30-40 years ago (documented in 2013)  Vaping Use   Vaping status: Never Used  Substance and Sexual Activity   Alcohol use: No    Alcohol/week: 0.0 standard drinks of alcohol   Drug use: No   Sexual activity: Not on file  Other Topics Concern   Not on file  Social History Narrative   Patient lives at home with her husband.   Retired.   Education 8 th grade   Right handed.   Caffeine none.   Social Drivers of Corporate investment banker Strain: Not on file  Food Insecurity: Not on file  Transportation Needs: Not on file  Physical Activity: Not on file  Stress: Not on file  Social Connections: Not on file  Intimate Partner Violence: Not on file   PHYSICAL EXAM  Vitals:   12/13/23 1305  BP: (!) 152/71  Pulse: (!) 57  Height: 5' 3 (1.6 m)   Body mass index is 27.1 kg/m.  Generalized: Well developed, in no acute distress  Neurological examination  Mentation: Alert oriented to time, place, most history is provided by her daughter. Follows all commands speech and language fluent. Patient is smiling, quiet, soft-spoken. Cranial nerve II-XII: Pupils were equal round reactive to light. Extraocular movements were full, visual field  were full on confrontational test.   Head turning and shoulder shrug  were normal and symmetric. Motor: Increased tone in the lower extremities right more than left, no antigravity movement, with upper extremities 3/5 strength, grip strengths weak but equal Sensory: Sensory testing is intact to soft touch on all 4 extremities. No evidence of extinction is noted.  Coordination: With finger-nose-finger some dysmetria with the right, okay with the left; cannot perform heel to shin with either side Gait and station: Is wheelchair bound Reflexes: Deep tendon reflexes are symmetric but depressed  DIAGNOSTIC DATA (LABS, IMAGING, TESTING) - I reviewed patient records, labs, notes, testing and imaging myself where available.  Lab Results  Component Value Date   WBC 4.6 02/14/2023   HGB 11.7 (L) 02/14/2023   HCT 36.0 02/14/2023   MCV 97.0 02/14/2023   PLT 224 02/14/2023      Component Value Date/Time   NA 139 02/14/2023 1249   NA 143 12/23/2021 0959   K 4.1 02/14/2023 1249   CL 105 02/14/2023 1249   CO2 23 02/14/2023 1249   GLUCOSE 109 (H) 02/14/2023 1249   BUN 9 02/14/2023 1249   BUN 13 12/23/2021 0959   CREATININE 0.71 02/14/2023 1249   CALCIUM  8.9 02/14/2023 1249   PROT 7.2 02/14/2023 1249   PROT 7.7 12/23/2021 0959   ALBUMIN 3.4 (L) 02/14/2023 1249   ALBUMIN 4.5 12/23/2021 0959   AST 39 02/14/2023 1249   ALT 27 02/14/2023 1249   ALKPHOS 58 02/14/2023 1249   BILITOT 0.3 02/14/2023 1249   BILITOT 0.4 12/23/2021 0959   GFRNONAA >60 02/14/2023 1249   GFRAA 94 02/14/2020 0935   Lab Results  Component Value Date   CHOL 170 10/26/2018   HDL 66 10/26/2018   LDLCALC 93 10/26/2018   TRIG 53 10/26/2018   CHOLHDL 2.6 10/26/2018   No results found for: HGBA1C No results found for: [id number] Lab Results  Component Value Date   TSH 2.179 02/14/2023    Lauraine Born, AGNP-C, DNP 12/13/2023, 1:36 PM [facility] [address], [address] [place],  [place]  [address] [phone]

## 2023-12-13 ENCOUNTER — Encounter: Payer: Self-pay | Admitting: Neurology

## 2023-12-13 ENCOUNTER — Ambulatory Visit: Payer: Medicare HMO | Admitting: Neurology

## 2023-12-13 VITALS — BP 152/71 | HR 57 | Ht 63.0 in

## 2023-12-13 DIAGNOSIS — G35 Multiple sclerosis: Secondary | ICD-10-CM

## 2023-12-13 DIAGNOSIS — G500 Trigeminal neuralgia: Secondary | ICD-10-CM | POA: Diagnosis not present

## 2023-12-13 MED ORDER — OXCARBAZEPINE 300 MG PO TABS: 300.0000 mg | ORAL_TABLET | Freq: Four times a day (QID) | ORAL | 4 refills | Status: DC

## 2023-12-13 MED ORDER — GABAPENTIN 600 MG PO TABS: 600.0000 mg | ORAL_TABLET | Freq: Four times a day (QID) | ORAL | 4 refills | Status: AC

## 2023-12-13 MED ORDER — DIMETHYL FUMARATE 240 MG PO CPDR: 240.0000 mg | DELAYED_RELEASE_CAPSULE | Freq: Two times a day (BID) | ORAL | 4 refills | Status: AC

## 2023-12-13 NOTE — Patient Instructions (Signed)
 Nice to see you today Continue current medications Check labs today Order for manual wheelchair  Follow-up in 1 year, can do virtual visit

## 2023-12-17 LAB — CBC WITH DIFFERENTIAL/PLATELET
Basophils Absolute: 0 x10E3/uL (ref 0.0–0.2)
Basos: 1 %
EOS (ABSOLUTE): 0.2 x10E3/uL (ref 0.0–0.4)
Eos: 5 %
Hematocrit: 41.7 % (ref 34.0–46.6)
Hemoglobin: 13.9 g/dL (ref 11.1–15.9)
Immature Grans (Abs): 0 x10E3/uL (ref 0.0–0.1)
Immature Granulocytes: 0 %
Lymphocytes Absolute: 1.7 x10E3/uL (ref 0.7–3.1)
Lymphs: 34 %
MCH: 32 pg (ref 26.6–33.0)
MCHC: 33.3 g/dL (ref 31.5–35.7)
MCV: 96 fL (ref 79–97)
Monocytes Absolute: 0.4 x10E3/uL (ref 0.1–0.9)
Monocytes: 8 %
Neutrophils Absolute: 2.7 x10E3/uL (ref 1.4–7.0)
Neutrophils: 52 %
Platelets: 213 x10E3/uL (ref 150–450)
RBC: 4.34 x10E6/uL (ref 3.77–5.28)
RDW: 13.6 % (ref 11.7–15.4)
WBC: 5.1 x10E3/uL (ref 3.4–10.8)

## 2023-12-17 LAB — COMPREHENSIVE METABOLIC PANEL WITH GFR
ALT: 32 IU/L (ref 0–32)
AST: 35 IU/L (ref 0–40)
Albumin: 3.9 g/dL (ref 3.8–4.8)
Alkaline Phosphatase: 101 IU/L (ref 44–121)
BUN/Creatinine Ratio: 28 (ref 12–28)
BUN: 19 mg/dL (ref 8–27)
Bilirubin Total: 0.5 mg/dL (ref 0.0–1.2)
CO2: 20 mmol/L (ref 20–29)
Calcium: 9.5 mg/dL (ref 8.7–10.3)
Chloride: 100 mmol/L (ref 96–106)
Creatinine, Ser: 0.69 mg/dL (ref 0.57–1.00)
Globulin, Total: 4.3 g/dL (ref 1.5–4.5)
Glucose: 116 mg/dL — ABNORMAL HIGH (ref 70–99)
Potassium: 4.7 mmol/L (ref 3.5–5.2)
Sodium: 138 mmol/L (ref 134–144)
Total Protein: 8.2 g/dL (ref 6.0–8.5)
eGFR: 89 mL/min/1.73 (ref >59)

## 2023-12-17 LAB — OXCARBAZEPINE (TRILEPTAL), SERUM: Oxcarbazepine SerPl-Mcnc: 40 ug/mL — AB (ref 10–35)

## 2023-12-20 ENCOUNTER — Ambulatory Visit: Payer: Self-pay | Admitting: Neurology

## 2023-12-29 ENCOUNTER — Ambulatory Visit: Payer: Medicare HMO | Admitting: Neurology

## 2024-01-31 ENCOUNTER — Encounter (HOSPITAL_COMMUNITY): Payer: Self-pay

## 2024-01-31 ENCOUNTER — Emergency Department (HOSPITAL_COMMUNITY)

## 2024-01-31 ENCOUNTER — Inpatient Hospital Stay (HOSPITAL_COMMUNITY)
Admission: EM | Admit: 2024-01-31 | Discharge: 2024-02-06 | DRG: 643 | Disposition: A | Discharge status: Home Health | Attending: Internal Medicine | Admitting: Internal Medicine

## 2024-01-31 ENCOUNTER — Other Ambulatory Visit: Payer: Self-pay

## 2024-01-31 DIAGNOSIS — E871 Hypo-osmolality and hyponatremia: Principal | ICD-10-CM | POA: Diagnosis present

## 2024-01-31 DIAGNOSIS — Z882 Allergy status to sulfonamides status: Secondary | ICD-10-CM | POA: Diagnosis not present

## 2024-01-31 DIAGNOSIS — G35D Multiple sclerosis, unspecified: Secondary | ICD-10-CM | POA: Diagnosis present

## 2024-01-31 DIAGNOSIS — Z90710 Acquired absence of both cervix and uterus: Secondary | ICD-10-CM

## 2024-01-31 DIAGNOSIS — I10 Essential (primary) hypertension: Secondary | ICD-10-CM | POA: Diagnosis present

## 2024-01-31 DIAGNOSIS — I959 Hypotension, unspecified: Secondary | ICD-10-CM | POA: Diagnosis not present

## 2024-01-31 DIAGNOSIS — Z993 Dependence on wheelchair: Secondary | ICD-10-CM

## 2024-01-31 DIAGNOSIS — G9341 Metabolic encephalopathy: Secondary | ICD-10-CM | POA: Diagnosis present

## 2024-01-31 DIAGNOSIS — Z7901 Long term (current) use of anticoagulants: Secondary | ICD-10-CM | POA: Diagnosis not present

## 2024-01-31 DIAGNOSIS — T402X5A Adverse effect of other opioids, initial encounter: Secondary | ICD-10-CM | POA: Diagnosis not present

## 2024-01-31 DIAGNOSIS — J9600 Acute respiratory failure, unspecified whether with hypoxia or hypercapnia: Secondary | ICD-10-CM | POA: Diagnosis not present

## 2024-01-31 DIAGNOSIS — E222 Syndrome of inappropriate secretion of antidiuretic hormone: Secondary | ICD-10-CM | POA: Diagnosis present

## 2024-01-31 DIAGNOSIS — Z87440 Personal history of urinary (tract) infections: Secondary | ICD-10-CM

## 2024-01-31 DIAGNOSIS — I4891 Unspecified atrial fibrillation: Secondary | ICD-10-CM | POA: Diagnosis not present

## 2024-01-31 DIAGNOSIS — Z6826 Body mass index (BMI) 26.0-26.9, adult: Secondary | ICD-10-CM

## 2024-01-31 DIAGNOSIS — N179 Acute kidney failure, unspecified: Secondary | ICD-10-CM | POA: Diagnosis not present

## 2024-01-31 DIAGNOSIS — G500 Trigeminal neuralgia: Secondary | ICD-10-CM | POA: Diagnosis present

## 2024-01-31 DIAGNOSIS — E7800 Pure hypercholesterolemia, unspecified: Secondary | ICD-10-CM | POA: Diagnosis present

## 2024-01-31 DIAGNOSIS — Z833 Family history of diabetes mellitus: Secondary | ICD-10-CM

## 2024-01-31 DIAGNOSIS — Z8249 Family history of ischemic heart disease and other diseases of the circulatory system: Secondary | ICD-10-CM | POA: Diagnosis not present

## 2024-01-31 DIAGNOSIS — Z79890 Hormone replacement therapy (postmenopausal): Secondary | ICD-10-CM

## 2024-01-31 DIAGNOSIS — R0689 Other abnormalities of breathing: Secondary | ICD-10-CM | POA: Diagnosis not present

## 2024-01-31 DIAGNOSIS — E039 Hypothyroidism, unspecified: Secondary | ICD-10-CM | POA: Diagnosis present

## 2024-01-31 DIAGNOSIS — R001 Bradycardia, unspecified: Secondary | ICD-10-CM | POA: Diagnosis not present

## 2024-01-31 DIAGNOSIS — R0609 Other forms of dyspnea: Secondary | ICD-10-CM | POA: Diagnosis not present

## 2024-01-31 DIAGNOSIS — R4182 Altered mental status, unspecified: Secondary | ICD-10-CM | POA: Diagnosis not present

## 2024-01-31 DIAGNOSIS — N3091 Cystitis, unspecified with hematuria: Secondary | ICD-10-CM | POA: Diagnosis present

## 2024-01-31 DIAGNOSIS — E663 Overweight: Secondary | ICD-10-CM | POA: Diagnosis present

## 2024-01-31 DIAGNOSIS — Z87891 Personal history of nicotine dependence: Secondary | ICD-10-CM

## 2024-01-31 DIAGNOSIS — Z820 Family history of epilepsy and other diseases of the nervous system: Secondary | ICD-10-CM | POA: Diagnosis not present

## 2024-01-31 DIAGNOSIS — N319 Neuromuscular dysfunction of bladder, unspecified: Secondary | ICD-10-CM | POA: Diagnosis present

## 2024-01-31 DIAGNOSIS — R569 Unspecified convulsions: Secondary | ICD-10-CM | POA: Diagnosis not present

## 2024-01-31 DIAGNOSIS — I482 Chronic atrial fibrillation, unspecified: Secondary | ICD-10-CM | POA: Diagnosis present

## 2024-01-31 DIAGNOSIS — Z79899 Other long term (current) drug therapy: Secondary | ICD-10-CM

## 2024-01-31 DIAGNOSIS — G928 Other toxic encephalopathy: Secondary | ICD-10-CM | POA: Diagnosis not present

## 2024-01-31 DIAGNOSIS — K219 Gastro-esophageal reflux disease without esophagitis: Secondary | ICD-10-CM | POA: Diagnosis present

## 2024-01-31 DIAGNOSIS — J984 Other disorders of lung: Secondary | ICD-10-CM | POA: Diagnosis present

## 2024-01-31 LAB — COMPREHENSIVE METABOLIC PANEL WITH GFR
ALT: 23 U/L (ref 0–44)
AST: 36 U/L (ref 15–41)
Albumin: 3.8 g/dL (ref 3.5–5.0)
Alkaline Phosphatase: 84 U/L (ref 38–126)
Anion gap: 13 (ref 5–15)
BUN: 15 mg/dL (ref 8–23)
CO2: 19 mmol/L — ABNORMAL LOW (ref 22–32)
Calcium: 9.1 mg/dL (ref 8.9–10.3)
Chloride: 89 mmol/L — ABNORMAL LOW (ref 98–111)
Creatinine, Ser: 0.53 mg/dL (ref 0.44–1.00)
GFR, Estimated: >60 mL/min (ref >60)
Glucose, Bld: 184 mg/dL — ABNORMAL HIGH (ref 70–99)
Potassium: 4.6 mmol/L (ref 3.5–5.1)
Sodium: 121 mmol/L — ABNORMAL LOW (ref 135–145)
Total Bilirubin: 0.8 mg/dL (ref 0.0–1.2)
Total Protein: 8.4 g/dL — ABNORMAL HIGH (ref 6.5–8.1)

## 2024-01-31 LAB — URINALYSIS, ROUTINE W REFLEX MICROSCOPIC
Bacteria, UA: NONE SEEN
Bilirubin Urine: NEGATIVE
Glucose, UA: NEGATIVE mg/dL
Ketones, ur: NEGATIVE mg/dL
Nitrite: NEGATIVE
Protein, ur: >=300 mg/dL — AB
RBC / HPF: >50 RBC/hpf (ref 0–5)
Specific Gravity, Urine: 1.01 (ref 1.005–1.030)
WBC, UA: >50 WBC/hpf (ref 0–5)
pH: 7 (ref 5.0–8.0)

## 2024-01-31 LAB — RESP PANEL BY RT-PCR (RSV, FLU A&B, COVID)  RVPGX2
Influenza A by PCR: NEGATIVE
Influenza B by PCR: NEGATIVE
Resp Syncytial Virus by PCR: NEGATIVE
SARS Coronavirus 2 by RT PCR: NEGATIVE

## 2024-01-31 LAB — CBC
HCT: 37.6 % (ref 36.0–46.0)
Hemoglobin: 13.2 g/dL (ref 12.0–15.0)
MCH: 32.3 pg (ref 26.0–34.0)
MCHC: 35.1 g/dL (ref 30.0–36.0)
MCV: 91.9 fL (ref 80.0–100.0)
Platelets: 261 K/uL (ref 150–400)
RBC: 4.09 MIL/uL (ref 3.87–5.11)
RDW: 13.2 % (ref 11.5–15.5)
WBC: 6.3 K/uL (ref 4.0–10.5)
nRBC: 0 % (ref 0.0–0.2)

## 2024-01-31 LAB — BRAIN NATRIURETIC PEPTIDE: B Natriuretic Peptide: 461.4 pg/mL — ABNORMAL HIGH (ref 0.0–100.0)

## 2024-01-31 MED ORDER — APIXABAN 5 MG PO TABS
5.0000 mg | ORAL_TABLET | Freq: Two times a day (BID) | ORAL | Status: DC
  Administered 2024-02-01: 5 mg via ORAL
  Filled 2024-01-31: qty 1

## 2024-01-31 MED ORDER — LEVOTHYROXINE SODIUM 25 MCG PO TABS
125.0000 ug | ORAL_TABLET | Freq: Every day | ORAL | Status: DC
  Administered 2024-02-01 – 2024-02-06 (×6): 125 ug via ORAL
  Filled 2024-01-31 (×6): qty 1

## 2024-01-31 MED ORDER — OXYCODONE HCL 5 MG PO TABS
5.0000 mg | ORAL_TABLET | ORAL | Status: DC | PRN
  Administered 2024-01-31 – 2024-02-01 (×3): 5 mg via ORAL
  Filled 2024-01-31 (×3): qty 1

## 2024-01-31 MED ORDER — METOPROLOL TARTRATE 25 MG PO TABS
25.0000 mg | ORAL_TABLET | Freq: Two times a day (BID) | ORAL | Status: DC
  Administered 2024-01-31 – 2024-02-01 (×2): 25 mg via ORAL
  Filled 2024-01-31 (×3): qty 1

## 2024-01-31 MED ORDER — TAMSULOSIN HCL 0.4 MG PO CAPS
0.4000 mg | ORAL_CAPSULE | Freq: Every day | ORAL | Status: DC
  Administered 2024-02-01 – 2024-02-06 (×6): 0.4 mg via ORAL
  Filled 2024-01-31 (×7): qty 1

## 2024-01-31 MED ORDER — OXCARBAZEPINE 150 MG PO TABS
300.0000 mg | ORAL_TABLET | Freq: Four times a day (QID) | ORAL | Status: DC
  Administered 2024-01-31: 300 mg via ORAL
  Filled 2024-01-31: qty 2
  Filled 2024-01-31: qty 1
  Filled 2024-01-31 (×2): qty 2
  Filled 2024-01-31: qty 1

## 2024-01-31 MED ORDER — ROSUVASTATIN CALCIUM 5 MG PO TABS
10.0000 mg | ORAL_TABLET | Freq: Every day | ORAL | Status: DC
  Administered 2024-02-01 – 2024-02-06 (×6): 10 mg via ORAL
  Filled 2024-01-31 (×2): qty 2
  Filled 2024-01-31: qty 1
  Filled 2024-01-31 (×5): qty 2

## 2024-01-31 MED ORDER — ENOXAPARIN SODIUM 40 MG/0.4ML IJ SOSY
40.0000 mg | PREFILLED_SYRINGE | INTRAMUSCULAR | Status: DC
  Administered 2024-01-31: 40 mg via SUBCUTANEOUS
  Filled 2024-01-31: qty 0.4

## 2024-01-31 MED ORDER — FUROSEMIDE 10 MG/ML IJ SOLN
20.0000 mg | Freq: Two times a day (BID) | INTRAMUSCULAR | Status: AC
  Administered 2024-01-31 (×2): 20 mg via INTRAVENOUS
  Filled 2024-01-31 (×3): qty 2

## 2024-01-31 MED ORDER — GABAPENTIN 300 MG PO CAPS
600.0000 mg | ORAL_CAPSULE | Freq: Three times a day (TID) | ORAL | Status: DC
  Administered 2024-01-31 – 2024-02-01 (×3): 600 mg via ORAL
  Filled 2024-01-31 (×4): qty 2

## 2024-01-31 MED ORDER — DIMETHYL FUMARATE 240 MG PO CPDR
240.0000 mg | DELAYED_RELEASE_CAPSULE | Freq: Two times a day (BID) | ORAL | Status: DC
  Administered 2024-02-01 – 2024-02-06 (×10): 240 mg via ORAL
  Filled 2024-01-31 (×12): qty 1

## 2024-01-31 MED ORDER — SODIUM CHLORIDE 0.9 % IV BOLUS
500.0000 mL | Freq: Once | INTRAVENOUS | Status: AC
  Administered 2024-01-31: 500 mL via INTRAVENOUS

## 2024-01-31 MED ORDER — DILTIAZEM HCL ER COATED BEADS 120 MG PO CP24
240.0000 mg | ORAL_CAPSULE | Freq: Every day | ORAL | Status: DC
Start: 2024-02-01 — End: 2024-02-01
  Administered 2024-02-01: 240 mg via ORAL
  Filled 2024-01-31: qty 2

## 2024-01-31 NOTE — ED Provider Notes (Signed)
 [place] EMERGENCY DEPARTMENT AT [facility] Provider Note   CSN: [id number] Arrival date & time: 01/31/24  9241     Patient presents with: Shortness of Breath   Jillian Fisher is a 78 y.o. female with past medical history significant for multiple sclerosis, neurogenic bladder, she is bedbound at baseline.  She has paroxysmal A-fib, she takes Eliquis , additionally with GERD, hyperlipidemia, hypothyroidism.  She endorses cough, nasal congestion, shortness of breath for around 2 days.  She also endorses some lower abdominal pain, dysuria.  She reports the shortness of breath is better when sitting up.  She denies any chest pain.    Shortness of Breath      Prior to Admission medications   Medication Sig Start Date End Date Taking? Authorizing Provider  cetirizine (ZYRTEC) 5 MG tablet Take 5 mg by mouth daily as needed for allergies.    [provider]  diltiazem  (CARDIZEM  CD) 240 MG 24 hr capsule TAKE 1 CAPSULE EVERY DAY 02/08/20   Ladona Heinz, MD  Dimethyl Fumarate  240 MG CPDR Take 1 capsule (240 mg total) by mouth in the morning and at bedtime. 12/13/23   [place] Lauraine PARAS, NP  ELIQUIS  5 MG TABS tablet TAKE 1 TABLET TWICE DAILY 03/10/20   Ladona Heinz, MD  gabapentin  (NEURONTIN ) 600 MG tablet Take 1 tablet (600 mg total) by mouth in the morning, at noon, in the evening, and at bedtime. 12/13/23   [place] Lauraine PARAS, NP  levothyroxine  (SYNTHROID , LEVOTHROID) 125 MCG tablet Take 125 mcg by mouth daily before breakfast.  12/11/14   [provider]  metoprolol  tartrate (LOPRESSOR ) 25 MG tablet TAKE 1 TABLET TWICE DAILY 05/13/20   Ganji, Jay, MD  Multiple Vitamin (MULTIVITAMIN WITH MINERALS) TABS Take 1 tablet by mouth daily.    [provider]  omeprazole (PRILOSEC) 20 MG capsule Take 20 mg by mouth daily as needed (reflux).     [provider]  Oxcarbazepine  (TRILEPTAL ) 300 MG tablet Take 1 tablet (300 mg total) by mouth 4 (four) times daily. 12/13/23    [place] Lauraine PARAS, NP  rosuvastatin  (CRESTOR ) 20 MG tablet Take 10 mg by mouth daily.     [provider]  tamsulosin (FLOMAX) 0.4 MG CAPS capsule Take 0.4 mg by mouth daily. 02/09/19   [provider]    Allergies: Sulfamethoxazole-trimethoprim and Sulfa antibiotics    Review of Systems  Respiratory:  Positive for shortness of breath.   All other systems reviewed and are negative.   Updated Vital Signs BP (!) 161/57   Pulse 67   Temp (!) 97.4 F (36.3 C) (Oral)   Resp 20   SpO2 93%   Physical Exam Vitals and nursing note reviewed.  Constitutional:      General: She is not in acute distress.    Appearance: Normal appearance.  HENT:     Head: Normocephalic and atraumatic.  Eyes:     General:        Right eye: No discharge.        Left eye: No discharge.  Cardiovascular:     Rate and Rhythm: Normal rate and regular rhythm.     Heart sounds: No murmur heard.    No friction rub. No gallop.  Pulmonary:     Effort: Pulmonary effort is normal.     Breath sounds: Normal breath sounds.     Comments: No wheezing, rhonchi, stridor, rales, no respiratory distress Abdominal:     General: Bowel sounds are normal.  Palpations: Abdomen is soft.     Comments: Mild tenderness to palpation in the suprapubic region, no rebound, rigidity, guarding or palpable mass  Skin:    General: Skin is warm and dry.     Capillary Refill: Capillary refill takes less than 2 seconds.  Neurological:     Mental Status: She is alert and oriented to person, place, and time.  Psychiatric:        Mood and Affect: Mood normal.        Behavior: Behavior normal.     (all labs ordered are listed, but only abnormal results are displayed) Labs Reviewed  COMPREHENSIVE METABOLIC PANEL WITH GFR - Abnormal; Notable for the following components:      Result Value   Sodium 121 (*)    Chloride 89 (*)    CO2 19 (*)    Glucose, Bld 184 (*)    Total Protein 8.4 (*)    All other components  within normal limits  BRAIN NATRIURETIC PEPTIDE - Abnormal; Notable for the following components:   B Natriuretic Peptide 461.4 (*)    All other components within normal limits  RESP PANEL BY RT-PCR (RSV, FLU A&B, COVID)  [id number]  CBC  URINALYSIS, ROUTINE W REFLEX MICROSCOPIC    EKG: EKG Interpretation Date/Time:  Tuesday January 31 2024 08:00:33 EDT Ventricular Rate:  83 PR Interval:    QRS Duration:  92 QT Interval:  387 QTC Calculation: 455 R Axis:   -6  Text Interpretation: Atrial fibrillation LVH with secondary repolarization abnormality Non-specific ST-t changes Confirmed by Bernard Drivers ([id number]) on 01/31/2024 8:04:30 AM  Radiology: DG Chest Portable 1 View Result Date: 01/31/2024 EXAM: 1 VIEW XRAY OF THE CHEST 01/31/2024 08:20:00 AM COMPARISON: 02/14/2023 stable cardiomegaly. CLINICAL HISTORY: Reason for exam: shob; Per triage notes: Cough/nasal congestion/ SHOB or 2 days. FINDINGS: LUNGS AND PLEURA: No focal pulmonary opacity. No pulmonary edema. No pleural effusion. No pneumothorax. HEART AND MEDIASTINUM: Stable cardiomegaly. BONES AND SOFT TISSUES: No acute osseous abnormality. IMPRESSION: 1. Stable cardiomegaly. Electronically signed by: Lynwood Seip MD 01/31/2024 08:26 AM EDT RP Workstation: [id number]     .Critical Care  Performed by: Rosan Sherlean DEL, PA-C Authorized by: Rosan Sherlean DEL, PA-C   Critical care provider statement:    Critical care time (minutes):  30   Critical care was necessary to treat or prevent imminent or life-threatening deterioration of the following conditions:  Metabolic crisis   Critical care was time spent personally by me on the following activities:  Development of treatment plan with patient or surrogate, discussions with consultants, evaluation of patient's response to treatment, examination of patient, ordering and review of laboratory studies, ordering and review of radiographic studies, ordering and performing treatments and  interventions, pulse oximetry, re-evaluation of patient's condition and review of old charts   Care discussed with: admitting provider      Medications Ordered in the ED  sodium chloride  0.9 % bolus 500 mL (has no administration in time range)                                    Medical Decision Making Amount and/or Complexity of Data Reviewed Labs: ordered. Radiology: ordered.   This patient is a 78 y.o. female  who presents to the ED for concern of shob.   Differential diagnoses prior to evaluation: The emergent differential diagnosis includes, but is not limited to,  asthma exacerbation, COPD exacerbation,  acute upper respiratory infection, acute bronchitis, chronic bronchitis, interstitial lung disease, ARDS, PE, pneumonia, atypical ACS, carbon monoxide poisoning, spontaneous pneumothorax, new CHF vs CHF exacerbation, versus other . This is not an exhaustive differential.   Past Medical History / Co-morbidities / Social History: Multiple sclerosis, paroxysmal A-fib, hypertension, hyperlipidemia, hypothyroidism, neurogenic bladder  Additional history: Chart reviewed. Pertinent results include: Reviewed remote lab work, imaging from ED evaluations, outpatient neurology visits  Physical Exam: Physical exam performed. The pertinent findings include: Somewhat hypertensive on arrival, blood pressure 174/69.  Breath sounds are equal bilaterally with no wheezing, rhonchi, stridor, rales.  Some mild tenderness over the suprapubic region with no rebound, rigidity, guarding.  Lab Tests/Imaging studies: I personally interpreted labs/imaging and the pertinent results include: CMP notable for hyponatremia, sodium 121.  Bicarb deficit, CO2 19, hypochloremia, chloride 89, mild hyperglycemia, glucose 24, CBC unremarkable.  BNP is elevated at 461, suspect dilutional hyponatremia.  Plain film chest x-ray with stable cardiomegaly, no acute abnormality or focal consolidation noted.  I agree with the  radiologist interpretation.  Cardiac monitoring: EKG obtained and interpreted by myself and attending physician which shows: A-fib, nonspecific ST-T changes   Medications: I ordered medication including fluid bolus due to hyponatremia.  I have reviewed the patients home medicines and have made adjustments as needed.  Consults: I spoke with the hospitalist, Dr. Georgina who agrees to admission for hyponatremia, shortness of breath   Disposition: After consideration of the diagnostic results and the patients response to treatment, I feel that patient would benefit from admission for hyponatremia as discussed above .    Final diagnoses:  None    ED Discharge Orders     None          Rosan Sherlean VEAR DEVONNA 01/31/24 1039    Bernard Drivers, MD 02/01/24 [phone]

## 2024-01-31 NOTE — H&P (Signed)
 History and Physical    Patient: Jillian Fisher FMW:[id number] DOB: 04/07/1945 DOA: 01/31/2024 DOS: the patient was seen and examined on 01/31/2024 PCP: Leonel Cole, MD  Patient coming from: Home  Chief Complaint:  Chief Complaint  Patient presents with   Shortness of Breath   HPI: Jillian Fisher is a 78 y.o. female with medical history significant of advanced MS, neurogenic bladder, recurrent UTIs, atrial fibrillation on anticoagulation, hypertension, hyperlipidemia and hypothyroidism who p/w fatigue and hyponatremia.  The patient experienced difficulty breathing, which started yesterday morning.Of note, her SOB was accompanied by cough and congestion; thus her RN and family, placed her on a cooling humidifier, which initially seemed to help, and provided some relief. However, this morning, despite taking her medications, the patient continued to feel like she could not breathe, and her family activated EMS.  In the ED, AFVSS. Labs notable for Na 121, and BNP 461. CXR with mild cephalization c/f pulmonary edema. EDP requested admission for hyponatremia.   Review of Systems: As mentioned in the history of present illness. All other systems reviewed and are negative. Past Medical History:  Diagnosis Date   Abnormality of gait 01/25/2013   Arthritis    GERD (gastroesophageal reflux disease)    Hypercholesteremia    Hypothyroidism    Multiple sclerosis    Neurogenic bladder 01/25/2013   Trigeminal neuralgia 07/07/2017   Right V3   Past Surgical History:  Procedure Laterality Date   ABDOMINAL HYSTERECTOMY     Social History:  reports that she quit smoking about 35 years ago. Her smoking use included cigarettes. She started smoking about 36 years ago. She has a 0.3 pack-year smoking history. She has never used smokeless tobacco. She reports that she does not drink alcohol and does not use drugs.  Allergies  Allergen Reactions   Sulfamethoxazole-Trimethoprim     Other  Reaction(s): Other (See Comments)   Sulfa Antibiotics Rash    Family History  Problem Relation Age of Onset   Heart attack Mother    Multiple sclerosis Sister    Heart disease Sister    Diabetes Other     Prior to Admission medications   Medication Sig Start Date End Date Taking? Authorizing Provider  cetirizine (ZYRTEC) 5 MG tablet Take 5 mg by mouth daily as needed for allergies.    [provider]  diltiazem  (CARDIZEM  CD) 240 MG 24 hr capsule TAKE 1 CAPSULE EVERY DAY 02/08/20   Ladona Heinz, MD  Dimethyl Fumarate  240 MG CPDR Take 1 capsule (240 mg total) by mouth in the morning and at bedtime. 12/13/23   Gayland Lauraine PARAS, NP  ELIQUIS  5 MG TABS tablet TAKE 1 TABLET TWICE DAILY 03/10/20   Ladona Heinz, MD  gabapentin  (NEURONTIN ) 600 MG tablet Take 1 tablet (600 mg total) by mouth in the morning, at noon, in the evening, and at bedtime. 12/13/23   Gayland Lauraine PARAS, NP  levothyroxine  (SYNTHROID , LEVOTHROID) 125 MCG tablet Take 125 mcg by mouth daily before breakfast.  12/11/14   [provider]  metoprolol  tartrate (LOPRESSOR ) 25 MG tablet TAKE 1 TABLET TWICE DAILY 05/13/20   Ganji, Jay, MD  Multiple Vitamin (MULTIVITAMIN WITH MINERALS) TABS Take 1 tablet by mouth daily.    [provider]  omeprazole (PRILOSEC) 20 MG capsule Take 20 mg by mouth daily as needed (reflux).     [provider]  Oxcarbazepine  (TRILEPTAL ) 300 MG tablet Take 1 tablet (300 mg total) by mouth 4 (four) times daily. 12/13/23  Gayland Lauraine PARAS, NP  rosuvastatin  (CRESTOR ) 20 MG tablet Take 10 mg by mouth daily.     [provider]  tamsulosin (FLOMAX) 0.4 MG CAPS capsule Take 0.4 mg by mouth daily. 02/09/19   [provider]    Physical Exam: Vitals:   01/31/24 1000 01/31/24 1011 01/31/24 1015 01/31/24 1030  BP: (!) 161/57  138/66 (!) 138/59  Pulse: 67  72 60  Resp: 20  17 15   Temp:  (!) 97.4 F (36.3 C)    TempSrc:  Oral    SpO2: 93%  93% 94%   General: Alert, oriented x3,  resting comfortably in no acute distress HEENT: EOMI, oropharynx clear, moist mucous membranes, hearing intact Neck: Trachea midline and no gross thyromegaly Respiratory: Lungs clear to auscultation bilaterally with normal respiratory effort; no w/r/r Cardiovascular: Regular rate and rhythm w/o m/r/g Abdomen: Soft, nontender, nondistended. Positive bowel sounds MSK: No obvious joint deformities or swelling Skin: No obvious rashes or lesions Neurologic: Awake, alert, spontaneously moves all extremities, strength intact Psychiatric: Appropriate mood and affect, conversational and cooperative   Data Reviewed:  Lab Results  Component Value Date   WBC 6.3 01/31/2024   HGB 13.2 01/31/2024   HCT 37.6 01/31/2024   MCV 91.9 01/31/2024   PLT 261 01/31/2024   Lab Results  Component Value Date   GLUCOSE 184 (H) 01/31/2024   CALCIUM  9.1 01/31/2024   NA 121 (L) 01/31/2024   K 4.6 01/31/2024   CO2 19 (L) 01/31/2024   CL 89 (L) 01/31/2024   BUN 15 01/31/2024   CREATININE 0.53 01/31/2024   Lab Results  Component Value Date   ALT 23 01/31/2024   AST 36 01/31/2024   ALKPHOS 84 01/31/2024   BILITOT 0.8 01/31/2024   No results found for: INR, PROTIME Radiology: DG Chest Portable 1 View Result Date: 01/31/2024 EXAM: 1 VIEW XRAY OF THE CHEST 01/31/2024 08:20:00 AM COMPARISON: 02/14/2023 stable cardiomegaly. CLINICAL HISTORY: Reason for exam: shob; Per triage notes: Cough/nasal congestion/ SHOB or 2 days. FINDINGS: LUNGS AND PLEURA: No focal pulmonary opacity. No pulmonary edema. No pleural effusion. No pneumothorax. HEART AND MEDIASTINUM: Stable cardiomegaly. BONES AND SOFT TISSUES: No acute osseous abnormality. IMPRESSION: 1. Stable cardiomegaly. Electronically signed by: Lynwood Seip MD 01/31/2024 08:26 AM EDT RP Workstation: [id number]    Assessment and Plan: 15F h/o advanced MS, neurogenic bladder, recurrent UTIs, atrial fibrillation on anticoagulation, hypertension, hyperlipidemia  and hypothyroidism who p/w fatigue and hyponatremia.  Hyopnatremia Elevated BNP Presumed ADHF -Heart failure navigation team consulted; apprec eval/recs -IV lasix 20mg  BID for now; goal net neg 1-2L/d; strict I/Os; daily standing weights; K>4/Mg>2 -F/u TTE to exclude ADHF  Afib -PTA metoprolol , diltiazem  and Eliquis  5mg  BID  MS Neurogenic bladder -PTA Dimethyl fumerate 240mg  BID -PTA flomax 0.4mg  daily -PTA Trileptal  and gabapentin   HTN -PTA metoprolol  and diltiazem   Hypothyroid -PTA Synthroid  125mcg daily   Advance Care Planning:   Code Status: Full Code   Consults: N/A  Family Communication: Daughter  Severity of Illness: The appropriate patient status for this patient is INPATIENT. Inpatient status is judged to be reasonable and necessary in order to provide the required intensity of service to ensure the patient's safety. The patient's presenting symptoms, physical exam findings, and initial radiographic and laboratory data in the context of their chronic comorbidities is felt to place them at high risk for further clinical deterioration. Furthermore, it is not anticipated that the patient will be medically stable for discharge from the hospital within 2 midnights  of admission.   * I certify that at the point of admission it is my clinical judgment that the patient will require inpatient hospital care spanning beyond 2 midnights from the point of admission due to high intensity of service, high risk for further deterioration and high frequency of surveillance required.*   ------- I spent 55 minutes reviewing previous notes, at the bedside counseling/discussing the treatment plan, and performing clinical documentation.  Author: Marsha Ada, MD 01/31/2024 10:57 AM  For on call review [email or web address].

## 2024-01-31 NOTE — ED Notes (Signed)
 CCMD called.

## 2024-01-31 NOTE — ED Notes (Signed)
 Report given to green paramedic.

## 2024-01-31 NOTE — Progress Notes (Signed)
 Heart Failure Navigator Progress Note  Assessed for Heart & Vascular TOC clinic readiness.  Patient does not meet criteria due to per MD note, patient is bed bound. No HF TOC. .   Navigator will sign off at this time.   Stephane Haddock, BSN, Scientist, Clinical (histocompatibility And Immunogenetics) Only

## 2024-01-31 NOTE — ED Triage Notes (Addendum)
 Cough/nasal congestion/ SHOB or 2 days. Pt is bed bound. Axox4. VSS. Denies CP.

## 2024-01-31 NOTE — ED Notes (Signed)
 Georgina, MD contacted regarding dosing time for 1600 dose of Gabapentin . Awaiting update.

## 2024-01-31 NOTE — ED Notes (Signed)
 Medication given and returned to the Pyxis with RN assistance.

## 2024-01-31 NOTE — ED Notes (Signed)
 Multiple medications placed on hold per pt daughter bedside. Notes attached to each med.

## 2024-01-31 NOTE — ED Notes (Signed)
 Pt given pericare and cleaned up after BM. Full linen change performed.

## 2024-02-01 ENCOUNTER — Inpatient Hospital Stay (HOSPITAL_COMMUNITY)

## 2024-02-01 DIAGNOSIS — N3091 Cystitis, unspecified with hematuria: Secondary | ICD-10-CM

## 2024-02-01 DIAGNOSIS — G928 Other toxic encephalopathy: Secondary | ICD-10-CM

## 2024-02-01 DIAGNOSIS — I10 Essential (primary) hypertension: Secondary | ICD-10-CM

## 2024-02-01 DIAGNOSIS — R0609 Other forms of dyspnea: Secondary | ICD-10-CM | POA: Diagnosis not present

## 2024-02-01 DIAGNOSIS — E039 Hypothyroidism, unspecified: Secondary | ICD-10-CM

## 2024-02-01 DIAGNOSIS — T402X5A Adverse effect of other opioids, initial encounter: Secondary | ICD-10-CM

## 2024-02-01 DIAGNOSIS — E871 Hypo-osmolality and hyponatremia: Secondary | ICD-10-CM | POA: Diagnosis not present

## 2024-02-01 DIAGNOSIS — I4891 Unspecified atrial fibrillation: Secondary | ICD-10-CM

## 2024-02-01 DIAGNOSIS — G9341 Metabolic encephalopathy: Secondary | ICD-10-CM

## 2024-02-01 DIAGNOSIS — G35D Multiple sclerosis, unspecified: Secondary | ICD-10-CM

## 2024-02-01 LAB — BASIC METABOLIC PANEL WITH GFR
Anion gap: 13 (ref 5–15)
BUN: 14 mg/dL (ref 8–23)
CO2: 19 mmol/L — ABNORMAL LOW (ref 22–32)
Calcium: 8.8 mg/dL — ABNORMAL LOW (ref 8.9–10.3)
Chloride: 90 mmol/L — ABNORMAL LOW (ref 98–111)
Creatinine, Ser: 0.64 mg/dL (ref 0.44–1.00)
GFR, Estimated: >60 mL/min (ref >60)
Glucose, Bld: 116 mg/dL — ABNORMAL HIGH (ref 70–99)
Potassium: 4.2 mmol/L (ref 3.5–5.1)
Sodium: 122 mmol/L — ABNORMAL LOW (ref 135–145)

## 2024-02-01 LAB — CBC
HCT: 35.8 % — ABNORMAL LOW (ref 36.0–46.0)
Hemoglobin: 12.4 g/dL (ref 12.0–15.0)
MCH: 32 pg (ref 26.0–34.0)
MCHC: 34.6 g/dL (ref 30.0–36.0)
MCV: 92.5 fL (ref 80.0–100.0)
Platelets: 258 K/uL (ref 150–400)
RBC: 3.87 MIL/uL (ref 3.87–5.11)
RDW: 13.2 % (ref 11.5–15.5)
WBC: 22.6 K/uL — ABNORMAL HIGH (ref 4.0–10.5)
nRBC: 0 % (ref 0.0–0.2)

## 2024-02-01 LAB — COMPREHENSIVE METABOLIC PANEL WITH GFR
ALT: 19 U/L (ref 0–44)
AST: 20 U/L (ref 15–41)
Albumin: 3.3 g/dL — ABNORMAL LOW (ref 3.5–5.0)
Alkaline Phosphatase: 66 U/L (ref 38–126)
Anion gap: 15 (ref 5–15)
BUN: 19 mg/dL (ref 8–23)
CO2: 19 mmol/L — ABNORMAL LOW (ref 22–32)
Calcium: 8.6 mg/dL — ABNORMAL LOW (ref 8.9–10.3)
Chloride: 89 mmol/L — ABNORMAL LOW (ref 98–111)
Creatinine, Ser: 1.1 mg/dL — ABNORMAL HIGH (ref 0.44–1.00)
GFR, Estimated: 52 mL/min — ABNORMAL LOW (ref >60)
Glucose, Bld: 116 mg/dL — ABNORMAL HIGH (ref 70–99)
Potassium: 4.2 mmol/L (ref 3.5–5.1)
Sodium: 123 mmol/L — ABNORMAL LOW (ref 135–145)
Total Bilirubin: 0.9 mg/dL (ref 0.0–1.2)
Total Protein: 7.6 g/dL (ref 6.5–8.1)

## 2024-02-01 LAB — PHOSPHORUS
Phosphorus: 3.7 mg/dL (ref 2.5–4.6)
Phosphorus: 4.1 mg/dL (ref 2.5–4.6)

## 2024-02-01 LAB — BLOOD GAS, ARTERIAL
Acid-base deficit: 2.1 mmol/L — ABNORMAL HIGH (ref 0.0–2.0)
Bicarbonate: 20.9 mmol/L (ref 20.0–28.0)
O2 Saturation: 99.1 %
Patient temperature: 37.9
pCO2 arterial: 31 mmHg — ABNORMAL LOW (ref 32–48)
pH, Arterial: 7.44 (ref 7.35–7.45)
pO2, Arterial: 115 mmHg — ABNORMAL HIGH (ref 83–108)

## 2024-02-01 LAB — PROCALCITONIN: Procalcitonin: <0.1 ng/mL

## 2024-02-01 LAB — GLUCOSE, CAPILLARY
Glucose-Capillary: 147 mg/dL — ABNORMAL HIGH (ref 70–99)
Glucose-Capillary: 140 mg/dL — ABNORMAL HIGH (ref 70–99)
Glucose-Capillary: 110 mg/dL — ABNORMAL HIGH (ref 70–99)
Glucose-Capillary: 99 mg/dL (ref 70–99)

## 2024-02-01 LAB — AMMONIA: Ammonia: 15 umol/L (ref 9–35)

## 2024-02-01 LAB — HEMOGLOBIN AND HEMATOCRIT, BLOOD
HCT: 35.4 % — ABNORMAL LOW (ref 36.0–46.0)
Hemoglobin: 12.8 g/dL (ref 12.0–15.0)

## 2024-02-01 LAB — TSH: TSH: 2.424 u[IU]/mL (ref 0.350–4.500)

## 2024-02-01 LAB — MRSA NEXT GEN BY PCR, NASAL: MRSA by PCR Next Gen: NOT DETECTED

## 2024-02-01 LAB — MAGNESIUM
Magnesium: 1.6 mg/dL — ABNORMAL LOW (ref 1.7–2.4)
Magnesium: 2.4 mg/dL (ref 1.7–2.4)

## 2024-02-01 LAB — ECHOCARDIOGRAM COMPLETE BUBBLE STUDY: Est EF: >75

## 2024-02-01 LAB — OSMOLALITY, URINE: Osmolality, Ur: 270 mosm/kg — ABNORMAL LOW (ref 300–900)

## 2024-02-01 LAB — BRAIN NATRIURETIC PEPTIDE: B Natriuretic Peptide: 838.7 pg/mL — ABNORMAL HIGH (ref 0.0–100.0)

## 2024-02-01 LAB — SODIUM, URINE, RANDOM: Sodium, Ur: 62 mmol/L

## 2024-02-01 MED ORDER — NALOXONE HCL 0.4 MG/ML IJ SOLN
INTRAMUSCULAR | Status: AC
  Administered 2024-02-01: 0.4 mg
  Filled 2024-02-01: qty 1

## 2024-02-01 MED ORDER — CHLORHEXIDINE GLUCONATE CLOTH 2 % EX PADS
6.0000 | MEDICATED_PAD | Freq: Every day | CUTANEOUS | Status: DC
  Administered 2024-02-01 – 2024-02-06 (×6): 6 via TOPICAL

## 2024-02-01 MED ORDER — ATROPINE SULFATE 1 MG/10ML IJ SOSY
1.0000 mg | PREFILLED_SYRINGE | Freq: Once | INTRAMUSCULAR | Status: AC
  Administered 2024-02-01: 1 mg via INTRAVENOUS
  Filled 2024-02-01: qty 10

## 2024-02-01 MED ORDER — NOREPINEPHRINE 4 MG/250ML-% IV SOLN
0.0000 ug/min | INTRAVENOUS | Status: DC
  Filled 2024-02-01: qty 250

## 2024-02-01 MED ORDER — MAGNESIUM SULFATE 2 GM/50ML IV SOLN
2.0000 g | Freq: Once | INTRAVENOUS | Status: AC
  Administered 2024-02-01: 2 g via INTRAVENOUS
  Filled 2024-02-01: qty 50

## 2024-02-01 MED ORDER — GLUCAGON HCL RDNA (DIAGNOSTIC) 1 MG IJ SOLR
2.0000 mg | Freq: Once | INTRAMUSCULAR | Status: AC
  Administered 2024-02-01: 2 mg via INTRAVENOUS
  Filled 2024-02-01: qty 2

## 2024-02-01 MED ORDER — SODIUM CHLORIDE 0.9 % IV SOLN
250.0000 mL | INTRAVENOUS | Status: AC
  Administered 2024-02-02: 250 mL via INTRAVENOUS

## 2024-02-01 MED ORDER — OXCARBAZEPINE 150 MG PO TABS
300.0000 mg | ORAL_TABLET | Freq: Three times a day (TID) | ORAL | Status: DC
Start: 2024-02-01 — End: 2024-02-01
  Administered 2024-02-01: 300 mg via ORAL
  Filled 2024-02-01 (×2): qty 2
  Filled 2024-02-01: qty 1
  Filled 2024-02-01: qty 2

## 2024-02-01 MED ORDER — SODIUM CHLORIDE 0.9 % IV SOLN
1.0000 g | INTRAVENOUS | Status: AC
  Administered 2024-02-01 – 2024-02-05 (×5): 1 g via INTRAVENOUS
  Filled 2024-02-01 (×5): qty 10

## 2024-02-01 MED ORDER — SODIUM CHLORIDE 0.9 % IV SOLN: INTRAVENOUS | Status: AC

## 2024-02-01 MED ORDER — SODIUM CHLORIDE 0.9 % IV BOLUS
500.0000 mL | Freq: Once | INTRAVENOUS | Status: AC
  Administered 2024-02-01: 500 mL via INTRAVENOUS

## 2024-02-01 MED ORDER — LACTATED RINGERS IV BOLUS
500.0000 mL | Freq: Once | INTRAVENOUS | Status: AC
  Administered 2024-02-01: 500 mL via INTRAVENOUS

## 2024-02-01 MED ORDER — POLYETHYLENE GLYCOL 3350 17 G PO PACK
17.0000 g | PACK | Freq: Every day | ORAL | Status: DC | PRN
Start: 2024-02-01 — End: 2024-02-06
  Administered 2024-02-02: 17 g via ORAL
  Filled 2024-02-01: qty 1

## 2024-02-01 NOTE — Progress Notes (Signed)
 Pt placed on BIPAP at this time.   02/01/24 2122  BiPAP/CPAP/SIPAP  $ Face Mask Small Yes  BiPAP/CPAP/SIPAP Pt Type Adult  BiPAP/CPAP/SIPAP SERVO  Reason BIPAP/CPAP not in use Other(comment)  Mask Type Full face mask  Dentures removed? Not applicable  Mask Size Small  Set Rate 0 breaths/min  Respiratory Rate 15 breaths/min  IPAP 0 cmH20  EPAP 0 cmH2O  Pressure Support 10 cmH20  PEEP 5 cmH20  FiO2 (%) 50 %  Flow Rate 0 lpm  Minute Ventilation 10.2  Leak 47  Peak Inspiratory Pressure (PIP) 15  Tidal Volume (Vt) 727  Heater Temperature 32 F (0 C)  Patient Home Machine No  Patient Home Mask No  Patient Home Tubing No  Auto Titrate No  Press High Alarm 40 cmH2O  Press Low Alarm 3 cmH2O  Nasal massage performed Yes  CPAP/SIPAP surface wiped down Yes  Device Plugged into RED Power Outlet Yes

## 2024-02-01 NOTE — Progress Notes (Signed)
 Echocardiogram 2D Echocardiogram has been performed.  Jillian Fisher 02/01/2024, 9:01 AM

## 2024-02-01 NOTE — Progress Notes (Signed)
 LTM VIDEO EEG hooked up and running - no initial skin breakdown - push button tested - [facility] is monitoring. TM/MB assisted each other with hook up.

## 2024-02-01 NOTE — Progress Notes (Signed)
 Medication dropped off at pharmacy. 31 capsules. Count verified with pharmacy technician. Medications stored by pharmacy paperwork in shadow chart.

## 2024-02-01 NOTE — Progress Notes (Signed)
 eLink Physician-Brief Progress Note Patient Name: Jillian Fisher DOB: 03/12/1946 MRN: [id number]   Date of Service  02/01/2024  HPI/Events of Note  Patient is hypotensive.  eICU Interventions  LR 500 ml iv bolus followed by peripheral Levophed gtt if BP remains low after the bolus.        [name] 02/01/2024, 9:53 PM

## 2024-02-01 NOTE — Consult Note (Addendum)
 NAME:  Jillian Fisher, MRN:  [id number], DOB:  05/22/1945, LOS: 1 ADMISSION DATE:  01/31/2024, CONSULTATION DATE:  02/01/24 REFERRING MD:  Theophilus SQUIBB., CHIEF COMPLAINT:  Shortness of breath   History of Present Illness:  78 year old with history of advanced MS, neurogenic bladder, UTIs, atrial fibrillation, hypertension, hyperlipidemia presenting with hyponatremia, UTI. PCCM consulted for evaluation of AMS and concerns for airway protection.  On exam patient in NAD, oxygenating well on 2L [place]. Patient appeared lethargic with delayed responses with reports from nursing that this is a change from earlier in the day. She was able to state name and lift head off bed. No recent sedating medications given, with oxycodone 5mg  given at 0933 this morning. Earlier today patient developed Afib w/ slow ventricular rate after receiving home metoprolol  and diltiazem , was given glucagon and atropine. BG normal. ABG unremarkable. Labs otherwise unrevealing as etiology. Given the patients AMS and concerns for airway protection, will admit to ICU for further evaluation and management.  Pertinent Medical History:   Past Medical History:  Diagnosis Date   Abnormality of gait 01/25/2013   Arthritis    GERD (gastroesophageal reflux disease)    Hypercholesteremia    Hypothyroidism    Multiple sclerosis    Neurogenic bladder 01/25/2013   Trigeminal neuralgia 07/07/2017   Right V3   Significant Hospital Events: Including procedures, antibiotic start and stop dates in addition to other pertinent events   10/29: Afib SVR>atropine/glucagon>AMS>ICU admit  Interim History / Subjective:  PCCM consulted for ICU evaluation  Objective    Blood pressure (!) 98/38, pulse (!) 50, temperature 100.2 F (37.9 C), temperature source Axillary, resp. rate 15, SpO2 95%.        Intake/Output Summary (Last 24 hours) at 02/01/2024 1657 Last data filed at 02/01/2024 0615 Gross per 24 hour  Intake --  Output 350 ml  Net  -350 ml   There were no vitals filed for this visit.  Examination: General: chronically-ill appearing elderly woman lying in bed HENT: [place]/AT, PERRL 3mm bilaterally Lungs: CTAB, oxygenating well on 2L [place] Cardiovascular: Afib SVR; no m/r/g Abdomen: soft, non distended, non tender to palpation Extremities: warm, dry, no peripheral edema Neuro: Delayed responses; able to follow commands, state her name, generalized weakness at baseline GU: foley in situ w/ hematuria noted in bag  Resolved Problem List:   Assessment and Plan:   Metabolic encephalopathy; multifactorial in setting of hemorrhagic cystitis/UTI vs possible MS flare  -Neurology consulted; appreciate recs; plans to hold on steroids at this time pending further workup -CTH and spot EEG pending -BG normal; ammonia pending -TSH normal at 2.4 -RVP negative -Infectious treatment as below -Continue supportive care  Hyponatremia unclear etiology; multifactorial w/ Trileptal  use vs diuretics vs infectious process -Sodium 121 on admission; now 122>goal sodium increase <14mmol/24 hours -Will bolus 500ml NS -Urine/serum osms/sodium pending -Follow sodium on labs  Pulmonary insufficiency in setting of Afib SVR -Oxygenating well on 2L [place] -Wean oxygen  to maintain SpO2 >94% -ABG 7.44/31/115/21  Elevated BNP C/f acute CHF Chronic Afib on Eliquis ; new onset SVR Chronic hypertension TTE: EF >75%; hyperdynamic. No wma. Mild LVH. Mildly enlarged RV, severely elevated PA pressures, RVP 68.5; RAP 8. No clinical signs of volume overload.  -Received 20mg  Lasix this morning -Hold metoprolol  and diltiazem ; was given glucagon+atropine for HR 40s, BP 90s/30s -Will hold Eliquis  in setting of hematuria; although improving per RN reports  Hemorrhagic cystitis Neurogenic bladder -Infectious treatment as below -Continue to monitor H/H -Monitor strict I&O via  foley -Monitor renal function on daily labs -Consider Urology consult for worsening  hematuria or clot development -UA +; UC pending  Hypothyroidism -Continue home synthroid  -TSH 2.42  FEN -NPO -Replace electrolytes as indicated  Trigeminal neuralgia MS Neurogenic bladder -PTA Dimethyl fumerate 240mg  BID -PTA flomax 0.4mg  daily -PTA Trileptal  and gabapentin ; patient instructed by Neurologist outpatient to decrease Trileptal  dose to prevent hyponatremia; dose decreased inpatient - Wheelchair dependent at baseline  Labs:  CBC: Recent Labs  Lab 01/31/24 0939 02/01/24 0328  WBC 6.3  --   HGB 13.2 12.8  HCT 37.6 35.4*  MCV 91.9  --   PLT 261  --     Basic Metabolic Panel: Recent Labs  Lab 01/31/24 0939 02/01/24 0328  NA 121* 122*  K 4.6 4.2  CL 89* 90*  CO2 19* 19*  GLUCOSE 184* 116*  BUN 15 14  CREATININE 0.53 0.64  CALCIUM  9.1 8.8*  MG  --  1.6*  PHOS  --  3.7   GFR: CrCl cannot be calculated (Unknown ideal weight.). Recent Labs  Lab 01/31/24 0939 02/01/24 0328  PROCALCITON  --  <0.10  WBC 6.3  --     Liver Function Tests: Recent Labs  Lab 01/31/24 0939  AST 36  ALT 23  ALKPHOS 84  BILITOT 0.8  PROT 8.4*  ALBUMIN 3.8   No results for input(s): LIPASE, AMYLASE in the last 168 hours. No results for input(s): AMMONIA in the last 168 hours.  ABG    Component Value Date/Time   HCO3 24.2 09/02/2017 2255   TCO2 22 04/07/2014 1256   ACIDBASEDEF 0.3 09/02/2017 2255   O2SAT 50.2 09/02/2017 2255     Coagulation Profile: No results for input(s): INR, PROTIME in the last 168 hours.  Cardiac Enzymes: No results for input(s): CKTOTAL, CKMB, CKMBINDEX, TROPONINI in the last 168 hours.  HbA1C: No results found for: HGBA1C  CBG: Recent Labs  Lab 02/01/24 1621  GLUCAP 147*    Review of Systems:   Review of systems completed with pertinent positives/negatives outlined in above HPI.  Past Medical History:  She,  has a past medical history of Abnormality of gait (01/25/2013), Arthritis, GERD  (gastroesophageal reflux disease), Hypercholesteremia, Hypothyroidism, Multiple sclerosis, Neurogenic bladder (01/25/2013), and Trigeminal neuralgia (07/07/2017).   Surgical History:   Past Surgical History:  Procedure Laterality Date   ABDOMINAL HYSTERECTOMY       Social History:   reports that she quit smoking about 35 years ago. Her smoking use included cigarettes. She started smoking about 36 years ago. She has a 0.3 pack-year smoking history. She has never used smokeless tobacco. She reports that she does not drink alcohol and does not use drugs.   Family History:  Her family history includes Diabetes in an other family member; Heart attack in her mother; Heart disease in her sister; Multiple sclerosis in her sister.   Allergies Allergies  Allergen Reactions   Sulfa Antibiotics Rash     Home Medications  Prior to Admission medications   Medication Sig Start Date End Date Taking? Authorizing Provider  acetaminophen  (TYLENOL ) 650 MG CR tablet Take 650 mg by mouth in the morning and at bedtime.   Yes [provider]  cetirizine (ZYRTEC) 5 MG tablet Take 5 mg by mouth daily as needed for allergies.   Yes [provider]  cholecalciferol (VITAMIN D3) 10 MCG/ML LIQD oral liquid Take 400 Units by mouth daily.   Yes [provider]  CRANBERRY PO Take 1 capsule  by mouth daily at 12 noon.   Yes [provider]  diltiazem  (CARDIZEM  CD) 240 MG 24 hr capsule TAKE 1 CAPSULE EVERY DAY 02/08/20  Yes Ladona Heinz, MD  Dimethyl Fumarate  240 MG CPDR Take 1 capsule (240 mg total) by mouth in the morning and at bedtime. 12/13/23  Yes Gayland Lauraine PARAS, NP  ELIQUIS  5 MG TABS tablet TAKE 1 TABLET TWICE DAILY 03/10/20  Yes Ladona Heinz, MD  gabapentin  (NEURONTIN ) 600 MG tablet Take 1 tablet (600 mg total) by mouth in the morning, at noon, in the evening, and at bedtime. 12/13/23  Yes Gayland Lauraine PARAS, NP  levothyroxine  (SYNTHROID ) 137 MCG tablet Take 137 mcg by mouth daily before  breakfast. 12/11/14  Yes [provider]  metoprolol  tartrate (LOPRESSOR ) 25 MG tablet TAKE 1 TABLET TWICE DAILY 05/13/20  Yes Ganji, Jay, MD  Multiple Vitamin (MULTIVITAMIN WITH MINERALS) TABS Take 1 tablet by mouth at bedtime.   Yes [provider]  omeprazole (PRILOSEC) 20 MG capsule Take 20 mg by mouth daily as needed (reflux).    Yes [provider]  Oxcarbazepine  (TRILEPTAL ) 300 MG tablet Take 1 tablet (300 mg total) by mouth 4 (four) times daily. Patient taking differently: Take 300 mg by mouth in the morning, at noon, and at bedtime. 12/13/23  Yes Gayland Lauraine PARAS, NP  rosuvastatin  (CRESTOR ) 20 MG tablet Take 10 mg by mouth daily.    Yes [provider]  tamsulosin (FLOMAX) 0.4 MG CAPS capsule Take 0.4 mg by mouth daily after supper. 02/09/19  Yes [provider]     Critical care time: 50 minutes   Amiel Sharrow, DNP, AGACNP-BC [facility]  Please see [email or web address] for pager details.  From 7A-7P if no response, please call [phone]. After hours, please call ELink [phone].

## 2024-02-01 NOTE — Significant Event (Addendum)
 Rapid Response Event Note   Reason for Call :  Decreased LOC  Initial Focused Assessment:  Patient is drowsy and very weak and speaks a little.  She is warm and dry. Lung sounds clear, decreased bases Heart tones irregular  BP 98/38  HR 51 RR 19  O2 sat 96% on 1.5L [place]  CCM and Neuro Hospitalists consulted. Interventions:  1 amp Atropine IV 0.4mg  Narcan x 2 IV ABG done (She is a little more active after narcan,  BP 119/40  HR 66) 500cc NS bolus Stat head CT done  Transported to 3M04  Plan of Care:     Event Summary:   MD Notified: Dr Sherlon at bedside Call Time: 1615 End Time: 1730  Elvin [place], RN

## 2024-02-01 NOTE — Consult Note (Signed)
 NEUROLOGY CONSULT NOTE   Date of service: February 01, 2024 Patient Name: Jillian Fisher MRN:  [id number] DOB:  21-Sep-1945 Chief Complaint: Altered mental status and decreased responsiveness Requesting Provider: Mannam, Praveen, MD  History of Present Illness  Jillian Fisher is a 78 y.o. female with hx of advanced MS, neurogenic bladder, recurrent UTIs, A-fib on Eliquis , hypertension, hyperlipidemia and hypothyroidism who originally presented with fatigue, shortness of breath and hyponatremia.  Patient also had hematuria starting yesterday.  Today, she was noted in the afternoon to have significantly decreased responsiveness.  Naloxone was given twice given the patient had received oxycodone prior.  She did become more alert after this.  She was also bradycardic down to the 50s and received a dose of atropine.  ROS   Unable to ascertain due to altered mental status  Past History   Past Medical History:  Diagnosis Date   Abnormality of gait 01/25/2013   Arthritis    GERD (gastroesophageal reflux disease)    Hypercholesteremia    Hypothyroidism    Multiple sclerosis    Neurogenic bladder 01/25/2013   Trigeminal neuralgia 07/07/2017   Right V3    Past Surgical History:  Procedure Laterality Date   ABDOMINAL HYSTERECTOMY      Family History: Family History  Problem Relation Age of Onset   Heart attack Mother    Multiple sclerosis Sister    Heart disease Sister    Diabetes Other     Social History  reports that she quit smoking about 35 years ago. Her smoking use included cigarettes. She started smoking about 36 years ago. She has a 0.3 pack-year smoking history. She has never used smokeless tobacco. She reports that she does not drink alcohol and does not use drugs.  Allergies  Allergen Reactions   Sulfa Antibiotics Rash    Medications   Current Facility-Administered Medications:    cefTRIAXone  (ROCEPHIN ) 1 g in sodium chloride  0.9 % 100 mL IVPB, 1 g,  Intravenous, Q24H, Elgergawy, Dawood S, MD, Stopped at 02/01/24 0955   Chlorhexidine Gluconate Cloth 2 % PADS 6 each, 6 each, Topical, Daily, Elgergawy, Dawood S, MD   Dimethyl Fumarate  CPDR 240 mg, 240 mg, Oral, BID, Moore, Willie, MD   furosemide (LASIX) injection 20 mg, 20 mg, Intravenous, BID, Georgina Basket, MD, 20 mg at 01/31/24 1856   levothyroxine  (SYNTHROID ) tablet 125 mcg, 125 mcg, Oral, Q0600, Georgina Basket, MD, 125 mcg at 02/01/24 0616   polyethylene glycol (MIRALAX / GLYCOLAX) packet 17 g, 17 g, Oral, Daily PRN, Dagenhart, Jamie H, NP   rosuvastatin  (CRESTOR ) tablet 10 mg, 10 mg, Oral, Daily, Georgina Basket, MD, 10 mg at 02/01/24 0916   tamsulosin (FLOMAX) capsule 0.4 mg, 0.4 mg, Oral, Daily, Georgina Basket, MD, 0.4 mg at 02/01/24 0916  Vitals   Vitals:   02/01/24 0942 02/01/24 1200 02/01/24 1306 02/01/24 1559  BP: (!) 172/60 (!) 102/48  (!) 98/38  Pulse: 79 (!) 49 (!) 50 (!) 50  Resp: (!) 22 13 15    Temp:   98.9 F (37.2 C) 100.2 F (37.9 C)  TempSrc:   Oral Axillary  SpO2: 97% 96% 95%     There is no height or weight on file to calculate BMI.   Physical Exam   Constitutional: Chronically ill-appearing elderly patient in no acute distress Psych: Affect blunted Eyes: No scleral injection.  HENT: No OP obstruction.  Head: Normocephalic.  Cardiovascular: Normal rate and regular rhythm.  Respiratory: Effort normal, non-labored breathing on supplemental O2  Skin: WDI.   Neurologic Examination    NEURO:  Mental Status: Patient is slightly drowsy and responds to name, able to state her name and that she is in [place] but does not answer other questions, intermittently follows simple commands Speech/Language: speech is in single words and short phrases with mild dysarthria  Cranial Nerves:  II: PERRL.  III, IV, VI: Questionable left gaze preference V: Sensation is intact to light touch and symmetrical to face.  VII: Smile is symmetrical.  VIII: hearing intact to  voice. IX, X: Voice is slightly dysarthric XII: tongue is midline without fasciculations. Motor: Initially moved left arm with antigravity strength I did not lift right arm off the bed but later had symmetrical antigravity strength in bilateral upper extremities.  Able to move bilateral lower extremities but does not lift them off the bed Tone: is normal and bulk is normal Sensation- Intact to light touch bilaterally.  Coordination: Unable to perform Gait- deferred   Labs/Imaging/Neurodiagnostic studies   CBC:  Recent Labs  Lab 02/21/2024 0939 02/01/24 0328  WBC 6.3  --   HGB 13.2 12.8  HCT 37.6 35.4*  MCV 91.9  --   PLT 261  --    Basic Metabolic Panel:  Lab Results  Component Value Date   NA 122 (L) 02/01/2024   K 4.2 02/01/2024   CO2 19 (L) 02/01/2024   GLUCOSE 116 (H) 02/01/2024   BUN 14 02/01/2024   CREATININE 0.64 02/01/2024   CALCIUM  8.8 (L) 02/01/2024   GFRNONAA >60 02/01/2024   GFRAA 94 02/14/2020   Lipid Panel:  Lab Results  Component Value Date   LDLCALC 93 10/26/2018   HgbA1c: No results found for: HGBA1C Urine Drug Screen:     Component Value Date/Time   LABOPIA NONE DETECTED 09/03/2017 0050   COCAINSCRNUR NONE DETECTED 09/03/2017 0050   LABBENZ NONE DETECTED 09/03/2017 0050   AMPHETMU NONE DETECTED 09/03/2017 0050   THCU NONE DETECTED 09/03/2017 0050   LABBARB NONE DETECTED 09/03/2017 0050   ABG    Component Value Date/Time   PHART 7.44 02/01/2024 1639   PCO2ART 31 (L) 02/01/2024 1639   PO2ART 115 (H) 02/01/2024 1639   HCO3 20.9 02/01/2024 1639   TCO2 22 04/07/2014 1256   ACIDBASEDEF 2.1 (H) 02/01/2024 1639   O2SAT 99.1 02/01/2024 1639     CT Head without contrast(Personally reviewed): No acute abnormalities  Neurodiagnostics Continuous EEG:  Pending  ASSESSMENT   Jillian Fisher is a 78 y.o. female with hx of advanced MS, neurogenic bladder, recurrent UTIs, A-fib on Eliquis , hypertension, hyperlipidemia and hypothyroidism who  originally was admitted with fatigue, hyponatremia and shortness of breath and today had an episode of altered mental status with decreased responsiveness.  On exam, she could state her name and city and could intermittently follow simple commands.  She was transiently noted to be moving her left arm more than her right, but this resolved spontaneously and had a questionable left gaze deviation.  Rapid response was called while patient was on 5 W. and she was given naloxone with some improvement in her mental status, then moved to the ICU.  Stat head CT demonstrated no bleed or other acute abnormalities.  ABG taken at the time did not demonstrate hypercarbia or hypoxia, but sodium was notably low at 121.  Given UTI with hematuria as well as advanced MS, likely etiology of symptoms is toxic metabolic encephalopathy.  In addition, notable hyponatremia (patient's baseline is in the 140s) could cause fatigue,  weakness, altered mental status as well as seizure activity.  Will connect patient to LTM EEG to determine if subclinical seizures are the source of some of her symptoms.  RECOMMENDATIONS  -LTM EEG - Correction of hyponatremia per CCM team - Treatment of UTI per CCM team - Discontinue Trileptal  as this can contribute to hyponatremia - Neurology will continue to follow ______________________________________________________________________  Patient seen by NP with MD, MD to edit note as needed.  Signed, Cortney E Everitt Clint Kill, NP [facility]    Attending Neurohospitalist Addendum Patient seen and examined with APP/Resident. Agree with the history and physical as documented above. Agree with the plan as documented, which I helped formulate. I have independently reviewed the chart, obtained history, review of systems and examined the patient.I have personally reviewed pertinent head/neck/spine imaging (CT/MRI).  Likely multifactorial toxic metabolic encephalopathy in a patient with poor  brain reserve from MS. Will do LTM to ensure she does not have underlying nonconvulsive seizures Supportive management per primary team Plan discussed with Dr. Forrestine  Please feel free to call with any questions.  -- Eligio Lav, MD Neurologist [facility] Pager: [phone]

## 2024-02-01 NOTE — TOC Initial Note (Addendum)
 Transition of Care [facility]) - Initial/Assessment Note    Patient Details  Name: Jillian Fisher MRN: [id number] Date of Birth: [birth year]  Transition [facility]) CM/SW Contact:    Marval Gell, RN Phone Number: 02/01/2024, 12:09 PM  Clinical Narrative:                  Spoke to patient's private duty CNA at bedside. She states that she has home CNA services in the mornings for 3-4 hours and the paient's daughter works from home. CNA provides that patient has needed DME at home  Patient's daughter Nena is her primary caregiver.LVM requesting callback.   Patient shows as active w [facility] in [facility] system, but per [place] she is not active, although she was just recently and has been several times in the past. [facility] is able to take back if needed or requested by the daughter.   Hx MS. Currently on 2L O2 Acute, will follow  Nayzeth, Altman (Daughter) [phone]     Barriers to Discharge: Continued Medical Work up   Patient Goals and CMS Choice Patient states their goals for this hospitalization and ongoing recovery are:: anticipate return home          Expected Discharge Plan and Services   Discharge Planning Services: CM Consult   Living arrangements for the past 2 months: Single Family Home                                      Prior Living Arrangements/Services Living arrangements for the past 2 months: Single Family Home Lives with:: Adult Children              Current home services: DME, Homehealth aide    Activities of Daily Living   ADL Screening (condition at time of admission) Independently performs ADLs?: No Does the patient have a NEW difficulty with bathing/dressing/toileting/self-feeding that is expected to last >3 days?: No Does the patient have a NEW difficulty with getting in/out of bed, walking, or climbing stairs that is expected to last >3 days?: No Does the patient have a NEW difficulty with communication that is expected  to last >3 days?: No Is the patient deaf or have difficulty hearing?: No Does the patient have difficulty seeing, even when wearing glasses/contacts?: No Does the patient have difficulty concentrating, remembering, or making decisions?: No  Permission Sought/Granted                  Emotional Assessment              Admission diagnosis:  Hyponatremia [E87.1] Patient Active Problem List   Diagnosis Date Noted   Hyponatremia 01/31/2024   Essential hypertension 08/11/2018   Trigeminal neuralgia 07/07/2017   Right leg weakness 02/25/2016   PAF (paroxysmal atrial fibrillation) (HCC) CHA2DS2-VASc Score is 3 with yearly risk of stroke of 3.2%. 02/25/2016   Weakness of right lower extremity 02/25/2016   Gait instability 02/24/2016   Multiple sclerosis (HCC) 01/25/2013   Abnormality of gait 01/25/2013   Neurogenic bladder 01/25/2013   PCP:  Leonel Cole, MD Pharmacy:   [facility] - [place], [place] - [address] [place] [address] Phone: [phone] Fax: [phone]  [facility] - [place], [place] - [address] [place] [address] Phone: [phone] Fax: [phone]  [id number] - [place], [place] - [address] [place] [address] Phone: [phone] Fax: [phone]  [facility] [address] [place] [address] Phone: [phone] Fax: [phone]     Social Drivers of Health (SDOH) Social History: SDOH Screenings   Tobacco Use: Medium Risk (01/31/2024)   SDOH Interventions:     Readmission Risk Interventions     No data to display

## 2024-02-01 NOTE — Progress Notes (Addendum)
 PROGRESS NOTE    Jillian Fisher  FMW:[id number] DOB: 01-28-1946 DOA: 01/31/2024 PCP: Leonel Cole, MD    Chief Complaint  Patient presents with   Shortness of Breath    Brief Narrative:    Jillian Fisher is a 78 y.o. female with medical history significant of advanced MS, neurogenic bladder, recurrent UTIs, atrial fibrillation on anticoagulation, hypertension, hyperlipidemia and hypothyroidism who p/w fatigue and hyponatremia.   The patient experienced difficulty breathing, which started yesterday morning.Of note, her SOB was accompanied by cough and congestion; thus her RN and family, placed her on a cooling humidifier, which initially seemed to help, and provided some relief. However, this morning, despite taking her medications, the patient continued to feel like she could not breathe, and her family activated EMS.   In the ED, AFVSS. Labs notable for Na 121, and BNP 461. CXR with mild cephalization c/f pulmonary edema. EDP requested admission for hyponatremia.   Assessment & Plan:   Principal Problem:   Hyponatremia   Hyopnatremia Elevated BNP Presumed acute CHF, unspecified -Low sodium at 121, concern for volume overload in the setting of elevated BNP and cardiomegaly on imaging. -2D echo is pending. - Continue with IV diuresis  Hyponatremia -Patient does not appear to be significantly volume overloaded, will check urine sodium, urine osmolality and serum osmolality, for other etiologies to hyponatremia. - On Trileptal , can be contributing to hyponatremia, instructed by her neurologist to decrease to 3 times daily, will go ahead and decrease the dose.  Hemorrhagic cystitis/UTI - Significant hematuria, supra pubic fullness/tenderness to palpation - Continue with IV Rocephin  - Follow urine cultures - Evidence of urinary retention, with known underlying neurogenic bladder   Afib -PTA metoprolol , diltiazem  and Eliquis  5mg  BID  Addendum A-fib with slow ventricular  response -Receiving her morning's dose of metoprolol  and Cardizem  CD, heart rate dips intermittently in the 30s and 40s, with 2.5-second pauses,.  AV blocking agents, will give 1 dose of IV glucagon.   MS Neurogenic bladder -PTA Dimethyl fumerate 240mg  BID -PTA flomax 0.4mg  daily - Wheelchair dependent at baseline  Trigeminal neuralgia -PTA Trileptal  and gabapentin  -Decrease Trileptal  to recommended dose of 3 times daily especially with hyponatremia   HTN -PTA metoprolol  and diltiazem    Hypothyroid -PTA Synthroid  125mcg daily  Hypomagnesemia -Replaced    DVT prophylaxis: Eliquis  Code Status: (Full code Family Communication: (None at bedside Disposition:   Status is: Inpatient    Consultants:  None  Subjective:  Still reports weakness, afebrile, no chest pain or shortness of breath  Objective: Vitals:   02/01/24 0124 02/01/24 0125 02/01/24 0359 02/01/24 0400  BP: (!) 157/61   (!) 150/47  Pulse: (!) 57 (!) 57 60 (!) 57  Resp: 14 12 16 16   Temp:      TempSrc:      SpO2: 92% 95% 94%     Intake/Output Summary (Last 24 hours) at 02/01/2024 0916 Last data filed at 02/01/2024 0615 Gross per 24 hour  Intake --  Output 350 ml  Net -350 ml   There were no vitals filed for this visit.  Examination:  Awake Alert, Oriented X 3, pale Symmetrical Chest wall movement, Good air movement bilaterally RRR,No Gallops,Rubs or new Murmurs, No Parasternal Heave +ve B.Sounds, Abd Soft, suprapubic fullness and tenderness to palpation No Cyanosis, Clubbing or edema, No new Rash or bruise      Data Reviewed: I have personally reviewed following labs and imaging studies  CBC: Recent Labs  Lab 01/31/24 0939 02/01/24  0328  WBC 6.3  --   HGB 13.2 12.8  HCT 37.6 35.4*  MCV 91.9  --   PLT 261  --     Basic Metabolic Panel: Recent Labs  Lab 01/31/24 0939 02/01/24 0328  NA 121* 122*  K 4.6 4.2  CL 89* 90*  CO2 19* 19*  GLUCOSE 184* 116*  BUN 15 14  CREATININE  0.53 0.64  CALCIUM  9.1 8.8*  MG  --  1.6*  PHOS  --  3.7    GFR: CrCl cannot be calculated (Unknown ideal weight.).  Liver Function Tests: Recent Labs  Lab 01/31/24 0939  AST 36  ALT 23  ALKPHOS 84  BILITOT 0.8  PROT 8.4*  ALBUMIN 3.8    CBG: No results for input(s): GLUCAP in the last 168 hours.   Recent Results (from the past 240 hours)  Resp panel by RT-PCR (RSV, Flu A&B, Covid) Anterior Nasal Swab     Status: None   Collection Time: 01/31/24  8:06 AM   Specimen: Anterior Nasal Swab  Result Value Ref Range Status   SARS Coronavirus 2 by RT PCR NEGATIVE NEGATIVE Final   Influenza A by PCR NEGATIVE NEGATIVE Final   Influenza B by PCR NEGATIVE NEGATIVE Final    Comment: (NOTE) The Xpert Xpress SARS-CoV-2/FLU/RSV plus assay is intended as an aid in the diagnosis of influenza from Nasopharyngeal swab specimens and should not be used as a sole basis for treatment. Nasal washings and aspirates are unacceptable for Xpert Xpress SARS-CoV-2/FLU/RSV testing.  Fact Sheet for Patients: bloggercourse.com  Fact Sheet for Healthcare Providers: seriousbroker.it  This test is not yet approved or cleared by the [place]  FDA and has been authorized for detection and/or diagnosis of SARS-CoV-2 by FDA under an Emergency Use Authorization (EUA). This EUA will remain in effect (meaning this test can be used) for the duration of the COVID-19 declaration under Section 564(b)(1) of the Act, 21 U.S.C. section 360bbb-3(b)(1), unless the authorization is terminated or revoked.     Resp Syncytial Virus by PCR NEGATIVE NEGATIVE Final    Comment: (NOTE) Fact Sheet for Patients: bloggercourse.com  Fact Sheet for Healthcare Providers: seriousbroker.it  This test is not yet approved or cleared by the [place]  FDA and has been authorized for detection and/or diagnosis of  SARS-CoV-2 by FDA under an Emergency Use Authorization (EUA). This EUA will remain in effect (meaning this test can be used) for the duration of the COVID-19 declaration under Section 564(b)(1) of the Act, 21 U.S.C. section 360bbb-3(b)(1), unless the authorization is terminated or revoked.  Performed at [facility], [address], [place], [place] [address]          Radiology Studies: DG Chest Portable 1 View Result Date: 01/31/2024 EXAM: 1 VIEW XRAY OF THE CHEST 01/31/2024 08:20:00 AM COMPARISON: 02/14/2023 stable cardiomegaly. CLINICAL HISTORY: Reason for exam: shob; Per triage notes: Cough/nasal congestion/ SHOB or 2 days. FINDINGS: LUNGS AND PLEURA: No focal pulmonary opacity. No pulmonary edema. No pleural effusion. No pneumothorax. HEART AND MEDIASTINUM: Stable cardiomegaly. BONES AND SOFT TISSUES: No acute osseous abnormality. IMPRESSION: 1. Stable cardiomegaly. Electronically signed by: Lynwood Seip MD 01/31/2024 08:26 AM EDT RP Workstation: [id number]        Scheduled Meds:  apixaban   5 mg Oral BID   diltiazem   240 mg Oral Daily   Dimethyl Fumarate   240 mg Oral BID   furosemide  20 mg Intravenous BID   gabapentin   600 mg Oral TID  levothyroxine   125 mcg Oral Q0600   metoprolol  tartrate  25 mg Oral BID   OXcarbazepine   300 mg Oral QID   rosuvastatin   10 mg Oral Daily   tamsulosin  0.4 mg Oral Daily   Continuous Infusions:  cefTRIAXone  (ROCEPHIN )  IV 1 g (02/01/24 0615)   magnesium  sulfate bolus IVPB       LOS: 1 day      Brayton Lye, MD [facility]   To contact the attending provider between 7A-7P or the covering provider during after hours 7P-7A, please log into the web site [email or web address] and access using universal [place] password for that web site. If you do not have the password, please call the hospital operator.  02/01/2024, 9:16 AM

## 2024-02-01 NOTE — Progress Notes (Signed)
 Mb called and spoke to nurse, pt is currently not availble and having urgent treatment performed, going to CT when more stable and then will be transferred to 6M. Will check back at a later time as schedule permits.

## 2024-02-02 ENCOUNTER — Inpatient Hospital Stay (HOSPITAL_COMMUNITY)

## 2024-02-02 DIAGNOSIS — R001 Bradycardia, unspecified: Secondary | ICD-10-CM

## 2024-02-02 DIAGNOSIS — R4182 Altered mental status, unspecified: Secondary | ICD-10-CM | POA: Diagnosis not present

## 2024-02-02 DIAGNOSIS — R569 Unspecified convulsions: Secondary | ICD-10-CM | POA: Diagnosis not present

## 2024-02-02 DIAGNOSIS — J9600 Acute respiratory failure, unspecified whether with hypoxia or hypercapnia: Secondary | ICD-10-CM

## 2024-02-02 LAB — CBC
HCT: 34.8 % — ABNORMAL LOW (ref 36.0–46.0)
Hemoglobin: 12.4 g/dL (ref 12.0–15.0)
MCH: 32.6 pg (ref 26.0–34.0)
MCHC: 35.6 g/dL (ref 30.0–36.0)
MCV: 91.6 fL (ref 80.0–100.0)
Platelets: 247 K/uL (ref 150–400)
RBC: 3.8 MIL/uL — ABNORMAL LOW (ref 3.87–5.11)
RDW: 13.2 % (ref 11.5–15.5)
WBC: 22.6 K/uL — ABNORMAL HIGH (ref 4.0–10.5)
nRBC: 0 % (ref 0.0–0.2)

## 2024-02-02 LAB — BASIC METABOLIC PANEL WITH GFR
Anion gap: 12 (ref 5–15)
BUN: 20 mg/dL (ref 8–23)
CO2: 18 mmol/L — ABNORMAL LOW (ref 22–32)
Calcium: 8.6 mg/dL — ABNORMAL LOW (ref 8.9–10.3)
Chloride: 93 mmol/L — ABNORMAL LOW (ref 98–111)
Creatinine, Ser: 0.94 mg/dL (ref 0.44–1.00)
GFR, Estimated: >60 mL/min (ref >60)
Glucose, Bld: 105 mg/dL — ABNORMAL HIGH (ref 70–99)
Potassium: 4.1 mmol/L (ref 3.5–5.1)
Sodium: 123 mmol/L — ABNORMAL LOW (ref 135–145)

## 2024-02-02 LAB — SODIUM
Sodium: 127 mmol/L — ABNORMAL LOW (ref 135–145)
Sodium: 127 mmol/L — ABNORMAL LOW (ref 135–145)
Sodium: 126 mmol/L — ABNORMAL LOW (ref 135–145)

## 2024-02-02 LAB — GLUCOSE, CAPILLARY: Glucose-Capillary: 98 mg/dL (ref 70–99)

## 2024-02-02 LAB — OSMOLALITY: Osmolality: 267 mosm/kg — ABNORMAL LOW (ref 275–295)

## 2024-02-02 LAB — MAGNESIUM: Magnesium: 2.3 mg/dL (ref 1.7–2.4)

## 2024-02-02 LAB — PHOSPHORUS: Phosphorus: 4.2 mg/dL (ref 2.5–4.6)

## 2024-02-02 MED ORDER — SODIUM CHLORIDE 1 G PO TABS
1.0000 g | ORAL_TABLET | Freq: Two times a day (BID) | ORAL | Status: DC
  Administered 2024-02-02 – 2024-02-04 (×5): 1 g via ORAL
  Filled 2024-02-02 (×5): qty 1

## 2024-02-02 MED ORDER — DILTIAZEM HCL 30 MG PO TABS
30.0000 mg | ORAL_TABLET | Freq: Four times a day (QID) | ORAL | Status: DC
  Administered 2024-02-02 – 2024-02-06 (×17): 30 mg via ORAL
  Filled 2024-02-02 (×18): qty 1

## 2024-02-02 MED ORDER — ACETAMINOPHEN 325 MG PO TABS
650.0000 mg | ORAL_TABLET | ORAL | Status: DC | PRN
  Administered 2024-02-02: 650 mg via ORAL
  Filled 2024-02-02: qty 2

## 2024-02-02 MED ORDER — GABAPENTIN 250 MG/5ML PO SOLN
300.0000 mg | Freq: Three times a day (TID) | ORAL | Status: DC
  Administered 2024-02-02 – 2024-02-06 (×11): 300 mg via ORAL
  Filled 2024-02-02 (×13): qty 6

## 2024-02-02 MED ORDER — ENSURE PLUS HIGH PROTEIN PO LIQD
237.0000 mL | Freq: Two times a day (BID) | ORAL | Status: DC
  Administered 2024-02-02 – 2024-02-06 (×9): 237 mL via ORAL

## 2024-02-02 NOTE — Progress Notes (Addendum)
 NAME:  Jillian Fisher, MRN:  [id number], DOB:  1945/11/29, LOS: 2 ADMISSION DATE:  01/31/2024, CONSULTATION DATE:  02/01/24 REFERRING MD:  Theophilus SQUIBB., CHIEF COMPLAINT:  Shortness of breath   History of Present Illness:  78 year old with history of advanced MS, neurogenic bladder, UTIs, atrial fibrillation, hypertension, hyperlipidemia presenting with hyponatremia, UTI. PCCM consulted for evaluation of AMS and concerns for airway protection.  On exam patient in NAD, oxygenating well on 2L [place]. Patient appeared lethargic with delayed responses with reports from nursing that this is a change from earlier in the day. She was able to state name and lift head off bed. No recent sedating medications given, with oxycodone 5mg  given at 0933 this morning. Earlier today patient developed Afib w/ slow ventricular rate after receiving home metoprolol  and diltiazem , was given glucagon and atropine. BG normal. ABG unremarkable. Labs otherwise unrevealing as etiology. Given the patients AMS and concerns for airway protection, will admit to ICU for further evaluation and management.  Pertinent Medical History:   Past Medical History:  Diagnosis Date   Abnormality of gait 01/25/2013   Arthritis    GERD (gastroesophageal reflux disease)    Hypercholesteremia    Hypothyroidism    Multiple sclerosis    Neurogenic bladder 01/25/2013   Trigeminal neuralgia 07/07/2017   Right V3   Significant Hospital Events: Including procedures, antibiotic start and stop dates in addition to other pertinent events   10/29: Afib SVR>atropine/glucagon>AMS>ICU admit , placed on LTM EEG by neurology  Interim History / Subjective:   Improving critically ill Did not require pressors Transiently placed on BiPAP, now on nasal cannula  Objective    Blood pressure (!) 159/52, pulse 94, temperature 98.2 F (36.8 C), temperature source Axillary, resp. rate 18, weight 65.6 kg, SpO2 92%.    FiO2 (%):  [50 %] 50 % PEEP:  [5  cmH20] 5 cmH20 Pressure Support:  [10 cmH20] 10 cmH20   Intake/Output Summary (Last 24 hours) at 02/02/2024 1037 Last data filed at 02/02/2024 1000 Gross per 24 hour  Intake 1598.8 ml  Output 975 ml  Net 623.8 ml   Filed Weights   02/01/24 1731 02/02/24 0500  Weight: 65.6 kg 65.6 kg    Examination: General: chronically-ill appearing elderly woman lying in bed HENT: [place]/AT, PERRL 3mm bilaterally Lungs: Clear breath sounds bilateral Cardiovascular: Irregular irregular Abdomen: soft, non distended, non tender to palpation Extremities: warm, dry, no peripheral edema Neuro: Follows one-step commands, able to engage in conversation, interactive oriented x 3 GU: foley changed  w/ hematuria   Labs show improved sodium to 127, leukocytosis, normal renal function, negative procalcitonin  Resolved Problem List:   Assessment and Plan:   Acute metabolic encephalopathy; multifactorial in setting of hemorrhagic cystitis/UTI vs possible MS flare , resolving -Neurology consulted;  - Head CT and EEG negative -BG normal; ammonia 15 -TSH normal at 2.4 -RVP negative -Continue supportive care  Hyponatremia unclear etiology; -Urine/serum osms/sodium consistent with SIADH multifactorial w/ Trileptal  use vs diuretics vs infectious process -Sodium 121 on admission; now improved to 127  -Follow sodium on labs but can allow to correct - Decrease normal saline to 50 cc/h and add sodium tabs  Acute respiratory insufficiency in setting of Afib SVR -Oxygenating well on 2L [place]   Elevated BNP C/f acute CHF Chronic Afib on Eliquis ; Bradycardia requiring atropine Hypertension TTE: EF >75%; hyperdynamic. No wma. Mild LVH. Mildly enlarged RV, severely elevated PA pressures, RVP 68.5; RAP 8. No clinical signs of volume overload.  -  Hold metoprolol , will resume Cardizem  30 every 6 and watch heart rate -Will hold Eliquis  in setting of hematuria; although improving per RN reports  Hemorrhagic  cystitis Neurogenic bladder Possible UTI -Continue to monitor H/H -Monitor strict I&O via foley -Monitor renal function on daily labs -Urology consulted for hematuria  -UA +; UC pending >> empiric ceftriaxone   Hypothyroidism -Continue home synthroid  -TSH 2.42   Trigeminal neuralgia MS Neurogenic bladder -PTA Dimethyl fumerate 240mg  BID -PTA flomax 0.4mg  daily - Holding PTA Trileptal  and gabapentin ;  - Wheelchair dependent at baseline - PT eval   She can transfer back to floor and to [facility]  Labs:  CBC: Recent Labs  Lab 01/31/24 0939 02/01/24 0328 02/01/24 1806 02/02/24 0011  WBC 6.3  --  22.6* 22.6*  HGB 13.2 12.8 12.4 12.4  HCT 37.6 35.4* 35.8* 34.8*  MCV 91.9  --  92.5 91.6  PLT 261  --  258 247    Basic Metabolic Panel: Recent Labs  Lab 01/31/24 0939 02/01/24 0328 02/01/24 1806 02/02/24 0011 02/02/24 0806  NA 121* 122* 123* 123* 127*  K 4.6 4.2 4.2 4.1  --   CL 89* 90* 89* 93*  --   CO2 19* 19* 19* 18*  --   GLUCOSE 184* 116* 116* 105*  --   BUN 15 14 19 20   --   CREATININE 0.53 0.64 1.10* 0.94  --   CALCIUM  9.1 8.8* 8.6* 8.6*  --   MG  --  1.6* 2.4 2.3  --   PHOS  --  3.7 4.1 4.2  --    GFR: Estimated Creatinine Clearance: 45.7 mL/min (by C-G formula based on SCr of 0.94 mg/dL). Recent Labs  Lab 01/31/24 0939 02/01/24 0328 02/01/24 1806 02/02/24 0011  PROCALCITON  --  <0.10  --   --   WBC 6.3  --  22.6* 22.6*    Liver Function Tests: Recent Labs  Lab 01/31/24 0939 02/01/24 1806  AST 36 20  ALT 23 19  ALKPHOS 84 66  BILITOT 0.8 0.9  PROT 8.4* 7.6  ALBUMIN 3.8 3.3*   No results for input(s): LIPASE, AMYLASE in the last 168 hours. Recent Labs  Lab 02/01/24 1806  AMMONIA 15    ABG    Component Value Date/Time   PHART 7.44 02/01/2024 1639   PCO2ART 31 (L) 02/01/2024 1639   PO2ART 115 (H) 02/01/2024 1639   HCO3 20.9 02/01/2024 1639   TCO2 22 04/07/2014 1256   ACIDBASEDEF 2.1 (H) 02/01/2024 1639   O2SAT 99.1 02/01/2024  1639     Coagulation Profile: No results for input(s): INR, PROTIME in the last 168 hours.  Cardiac Enzymes: No results for input(s): CKTOTAL, CKMB, CKMBINDEX, TROPONINI in the last 168 hours.  HbA1C: No results found for: HGBA1C  CBG: Recent Labs  Lab 02/01/24 1621 02/01/24 1738 02/01/24 1923 02/01/24 2308 02/02/24 0306  GLUCAP 147* 140* 110* 99 98   My independent critical care time was 31 minutes  Harden Staff MD. FCCP. [facility] Pager : [phone]  If no response to pager , please call [phone] until 7 pm After 7:00 pm call Elink  [phone]   02/02/2024

## 2024-02-02 NOTE — Progress Notes (Signed)
 LTM EEG discontinued - no skin breakdown at [facility].

## 2024-02-02 NOTE — Evaluation (Signed)
 Physical Therapy Evaluation Patient Details Name: Jillian Fisher MRN: [id number] DOB: 1945-05-31 Today's Date: 02/02/2024  History of Present Illness  78 y.o. female admitted 10/28 with fatigue, hyponatremia and shortness of breath. Pt found to have hemorrhagic cystitis/UTI, and A-fib with ventricular response. 10/29 pt had an episode of altered mental status with decreased responsiveness with bradycardia. Pt taken to ICU and placed on BiPAP. PMH:of advanced MS, neurogenic bladder, trigeminal neuralgia, recurrent UTIs, A-fib on Eliquis , hypertension, hyperlipidemia and hypothyroidism  Clinical Impression  Pt pleasantly confused on entry, agreeable to therapy. Per RN pt lives with her daughter, pt able to relay that she has a caregiver and an [employer]. At baseline, pt reports she only gets into her wheelchair for going to doctor's appointments and spends the rest of her time in bed. Pt lack of dorsiflexion, and hip and knee flexion, support this report. Pt requires maxAx2 for coming to EoB. Pt able to sit EoB with mod-max posterior support for ~ 8 minutes. During which time pt is able to wash her face with set up from OT. Pt is obviously well cared for and is likely at her baseline level of function. PT recommending return home when medically stable, without HHPT. PT signing off.           If plan is discharge home, recommend the following: Two people to help with walking and/or transfers;A lot of help with bathing/dressing/bathroom;Assistance with cooking/housework;Assistance with feeding;Direct supervision/assist for medications management;Direct supervision/assist for financial management;Assist for transportation;Help with stairs or ramp for entrance;Supervision due to cognitive status   Can travel by private vehicle    Maybe    Equipment Recommendations None recommended by PT  Recommendations for Other Services       Functional Status Assessment Patient has not had a recent decline in their  functional status     Precautions / Restrictions Precautions Precautions: Fall Restrictions Weight Bearing Restrictions Per Provider Order: No      Mobility  Bed Mobility Overal bed mobility: Needs Assistance Bed Mobility: Supine to Sit, Sit to Supine     Supine to sit: Max assist, +2 for physical assistance Sit to supine: Total assist, +2 for physical assistance   General bed mobility comments: maxAx2 for coming to EoB, pt with decreased ability to move LE needing pad scoot off bed, pt able to provide more assist to bring trunk to upright, total Ax2 for returning to supine          Balance Overall balance assessment: Needs assistance Sitting-balance support: Feet unsupported, Bilateral upper extremity supported Sitting balance-Leahy Scale: Zero Sitting balance - Comments: pt needs posterior support for balance, reporting increased stomach pain with forward flexion Postural control: Posterior lean                                   Pertinent Vitals/Pain Pain Assessment Pain Assessment: Faces Faces Pain Scale: Hurts even more Pain Location: abdomen with forward flexion Pain Descriptors / Indicators: Grimacing, Guarding, Moaning Pain Intervention(s): Limited activity within patient's tolerance, Monitored during session, Repositioned    Home Living Family/patient expects to be discharged to:: Private residence Living Arrangements: Children                      Prior Function Prior Level of Function : Needs assist;Patient poor historian/Family not available             Mobility Comments: pt reports  she only gets up to wheelchair to go to doctor's appointments, otherwise spends her time in bed ADLs Comments: pt report she has a caregiver that assists with her bathing, dressing, and cleaning after BM     Extremity/Trunk Assessment   Upper Extremity Assessment Upper Extremity Assessment: Defer to OT evaluation    Lower Extremity  Assessment Lower Extremity Assessment: RLE deficits/detail;LLE deficits/detail RLE Deficits / Details: decreased PROM dorsiflexion, knee and hip flexion LLE Deficits / Details: decreased PROM dorsiflexion, knee and hip flexion    Cervical / Trunk Assessment Cervical / Trunk Assessment: Kyphotic  Communication   Communication Communication: Impaired Factors Affecting Communication: Difficulty expressing self    Cognition Arousal: Alert Behavior During Therapy: WFL for tasks assessed/performed   PT - Cognitive impairments: History of cognitive impairments                       PT - Cognition Comments: Oriented to self and place Following commands: Impaired Following commands impaired: Follows one step commands inconsistently     Cueing Cueing Techniques: Verbal cues, Gestural cues, Tactile cues, Visual cues     General Comments General comments (skin integrity, edema, etc.): Pt's son present at very end of session.    Exercises     Assessment/Plan    PT Assessment Patient does not need any further PT services         PT Goals (Current goals can be found in the Care Plan section)  Acute Rehab PT Goals PT Goal Formulation: All assessment and education complete, DC therapy     AM-PAC PT 6 Clicks Mobility  Outcome Measure Help needed turning from your back to your side while in a flat bed without using bedrails?: Total Help needed moving from lying on your back to sitting on the side of a flat bed without using bedrails?: Total Help needed moving to and from a bed to a chair (including a wheelchair)?: Total Help needed standing up from a chair using your arms (e.g., wheelchair or bedside chair)?: Total Help needed to walk in Fisher room?: Total Help needed climbing 3-5 steps with a railing? : Total 6 Click Score: 6    End of Session   Activity Tolerance: Patient tolerated treatment well Patient left: in bed;with call bell/phone within reach;with bed  alarm set;with family/visitor present Nurse Communication: Mobility status PT Visit Diagnosis: Muscle weakness (generalized) (M62.81);Difficulty in walking, not elsewhere classified (R26.2);Other symptoms and signs involving the nervous system (R29.898)    Time: 8581-8563 PT Time Calculation (min) (ACUTE ONLY): 18 min   Charges:   PT Evaluation $PT Eval Low Complexity: 1 Low   PT General Charges $$ ACUTE PT VISIT: 1 Visit         Mertis Mosher B. Fleeta Lapidus PT, DPT Acute Rehabilitation Services Please use secure chat or  Call Office [phone]   Jillian Fisher 02/02/2024, 3:36 PM

## 2024-02-02 NOTE — Evaluation (Signed)
 Occupational Therapy Evaluation Patient Details Name: Jillian Fisher MRN: [id number] DOB: 01-18-1946 Today's Date: 02/02/2024   History of Present Illness   78 y.o. female admitted 10/28 with fatigue, hyponatremia and shortness of breath. Pt found to have hemorrhagic cystitis/UTI, and A-fib with ventricular response. 10/29 pt had an episode of altered mental status with decreased responsiveness with bradycardia. Pt taken to ICU and placed on BiPAP. PMH:of advanced MS, neurogenic bladder, trigeminal neuralgia, recurrent UTIs, A-fib on Eliquis , hypertension, hyperlipidemia and hypothyroidism     Clinical Impressions Pt greeted in supine, agreeable for OT visit. PTA, pt was seldom getting OOB and has assistance for all ADLs. Has a caregiver who assists with toileting, bathing, dressing; children also assist OOB when she goes to appointments. Pt appears with lack of B ankle DF and hip/knee ROM which supports pt mostly bedbound. She was able to perform simple grooming with setup at EOB, needed max A +2 to come to EOB and tolerated sitting ~8' with mod-max A 2/2 posterior lean. Brief O2 desat to ~86% on RA, improved to > 92% with cues for PLB.   Pt is functioning near her baseline and does not warrant any further acute OT needs, will sign-off.      If plan is discharge home, recommend the following:   Two people to help with walking and/or transfers;A lot of help with bathing/dressing/bathroom;Assistance with cooking/housework;Direct supervision/assist for medications management;Direct supervision/assist for financial management;Assist for transportation;Supervision due to cognitive status     Functional Status Assessment         Equipment Recommendations   None recommended by OT     Recommendations for Other Services         Precautions/Restrictions   Precautions Precautions: Fall Recall of Precautions/Restrictions: Impaired Precaution/Restrictions Comments: delirium  prevention Restrictions Weight Bearing Restrictions Per Provider Order: No     Mobility Bed Mobility Overal bed mobility: Needs Assistance Bed Mobility: Supine to Sit, Sit to Supine     Supine to sit: Max assist, +2 for physical assistance Sit to supine: Total assist, +2 for physical assistance   General bed mobility comments: Exited to the R side, cues for initiation and LE mgmt/trunk elevation    Transfers                   General transfer comment: deferred 2/2 poor sitting balance and pt does not typically stand      Balance Overall balance assessment: Needs assistance Sitting-balance support: Bilateral upper extremity supported, Feet supported Sitting balance-Leahy Scale: Zero Sitting balance - Comments: mod-max A to sustain sitting balance with cues to lean forward and to maintain BUE support on mattress Postural control: Posterior lean                                 ADL either performed or assessed with clinical judgement   ADL Overall ADL's : At baseline                                       General ADL Comments: setup for seated grooming with cues for thoroughness, close supervision for simulated feeding, otherwise anticipate total A for remaining ADLs (foley intact)     Vision Baseline Vision/History:  (no eye glasses present during OT eval this date)       Perception  Praxis         Pertinent Vitals/Pain Pain Assessment Pain Assessment: Faces Pain Score: 6  Pain Location: abdomen when leaning forward Pain Descriptors / Indicators: Grimacing, Guarding, Moaning Pain Intervention(s): Limited activity within patient's tolerance, Monitored during session, Repositioned     Extremity/Trunk Assessment Upper Extremity Assessment Upper Extremity Assessment: Generalized weakness   Lower Extremity Assessment Lower Extremity Assessment: Defer to PT evaluation RLE Deficits / Details: decreased PROM  dorsiflexion, knee and hip flexion LLE Deficits / Details: decreased PROM dorsiflexion, knee and hip flexion   Cervical / Trunk Assessment Cervical / Trunk Assessment: Kyphotic   Communication Communication Communication: Impaired Factors Affecting Communication: Difficulty expressing self   Cognition Arousal: Alert Behavior During Therapy: WFL for tasks assessed/performed Cognition: Cognition impaired   Orientation impairments: Time, Situation (logical cues for Halloween with pt answering accurately) Awareness: Intellectual awareness impaired Memory impairment (select all impairments): Short-term memory, Working memory Attention impairment (select first level of impairment): Sustained attention Executive functioning impairment (select all impairments): Initiation, Organization, Sequencing, Reasoning, Problem solving OT - Cognition Comments: ? baseline cog impairment, family not present to confirm                 Following commands: Impaired Following commands impaired: Follows one step commands inconsistently     Cueing  General Comments   Cueing Techniques: Verbal cues;Gestural cues;Tactile cues;Visual cues  son present at very end of session   Exercises     Shoulder Instructions      Home Living Family/patient expects to be discharged to:: Private residence Living Arrangements: Children;Other (Comment)                               Additional Comments: caregiver 2x/wk to assist with ADLs      Prior Functioning/Environment Prior Level of Function : Needs assist;Patient poor historian/Family not available             Mobility Comments: pt reports caregiver and family assist with OOB mobility to w/c (seldom gets OOB) ADLs Comments: pt reports having a caregiver to assist with bathing, dressing, and toileting/hygiene    OT Problem List: Decreased strength;Decreased range of motion;Decreased activity tolerance;Impaired balance (sitting  and/or standing);Decreased cognition;Decreased safety awareness;Pain;Obesity   OT Treatment/Interventions:        OT Goals(Current goals can be found in the care plan section)   Acute Rehab OT Goals Patient Stated Goal: pt did not state goal   OT Frequency:       Co-evaluation PT/OT/SLP Co-Evaluation/Treatment: Yes Reason for Co-Treatment: Complexity of the patient's impairments (multi-system involvement);For patient/therapist safety;To address functional/ADL transfers;Necessary to address cognition/behavior during functional activity   OT goals addressed during session: ADL's and self-care      AM-PAC OT 6 Clicks Daily Activity     Outcome Measure Help from another person eating meals?: None Help from another person taking care of personal grooming?: A Little Help from another person toileting, which includes using toliet, bedpan, or urinal?: Total Help from another person bathing (including washing, rinsing, drying)?: Total Help from another person to put on and taking off regular upper body clothing?: A Lot Help from another person to put on and taking off regular lower body clothing?: Total 6 Click Score: 12   End of Session Nurse Communication: Mobility status;Other (comment) (pt status)  Activity Tolerance: Patient tolerated treatment well Patient left: in bed;with call bell/phone within reach;with bed alarm set;with family/visitor present;Other (comment) (heels offloaded)  OT Visit Diagnosis: Unsteadiness on feet (R26.81);Muscle weakness (generalized) (M62.81);Pain Pain - part of body:  (abdomen)                Time: 8581-8563 OT Time Calculation (min): 18 min Charges:  OT General Charges $OT Visit: 1 Visit OT Evaluation $OT Eval Low Complexity: 1 Low  Rikki BIRCH., MSOT, OTR/L Acute Rehabilitation Services [phone] Secure Chat Preferred  Rikki Milch 02/02/2024, 4:20 PM

## 2024-02-02 NOTE — Progress Notes (Signed)
 Pt complaining of painful urination. Foley in place draining red bloody urine, blood noted under pt. MD made aware and foley exchanged.

## 2024-02-02 NOTE — Procedures (Addendum)
 Patient Name: Jillian Fisher  MRN: [id number]  Epilepsy Attending: Arlin MALVA Krebs  Referring Physician/Provider: everitt Clint Abbey Earle FORBES, NP  Duration: 02/01/2024 1757 to 02/02/2024 0915  Patient history: 78yo M with ams. EEG to evaluate for seizure  Level of alertness: Awake, asleep  AEDs during EEG study: None  Technical aspects: This EEG study was done with scalp electrodes positioned according to the 10-20 International system of electrode placement. Electrical activity was reviewed with band pass filter of 1-70Hz , sensitivity of 7 uV/mm, display speed of 66mm/sec with a 60Hz  notched filter applied as appropriate. EEG data were recorded continuously and digitally stored.  Video monitoring was available and reviewed as appropriate.  Description: The posterior dominant rhythm consists of 7.5 Hz activity of moderate voltage (25-35 uV) seen predominantly in posterior head regions, symmetric and reactive to eye opening and eye closing. Sleep was characterized by vertex waves, sleep spindles (12 to 14 Hz), maximal frontocentral region. EEG showed continuous generalized 5 to 7 Hz theta slowing. Hyperventilation and photic stimulation were not performed.     ABNORMALITY - Continuous slow, generalized  IMPRESSION: This study is suggestive of mild generalized cerebral dysfunction (encephalopathy). No seizures or epileptiform discharges were seen throughout the recording.  Avaree Gilberti O Cordie Beazley

## 2024-02-02 NOTE — Progress Notes (Signed)
 NEUROLOGY CONSULT FOLLOW UP NOTE   Date of service: February 02, 2024 Patient Name: Jillian Fisher MRN:  [id number] DOB:  [birth year]  Interval Hx/subjective  Patient had some hypotension overnight but is now hemodynamically stable.  She was on BiPAP for part of the night but now is doing well without it.  LTM EEG is negative for seizure activity.  Vitals   Vitals:   02/02/24 0645 02/02/24 0700 02/02/24 0800 02/02/24 0851  BP:  (!) 139/51 (!) 150/55   Pulse: 60 61 70   Resp: 15 12 19    Temp:      TempSrc:      SpO2: 97% 98% 100% 97%  Weight:         Body mass index is 25.62 kg/m.  Physical Exam   Constitutional: Chronically ill-appearing elderly patient in no acute distress Psych: Affect appropriate to situation.  Eyes: No scleral injection.  HENT: No OP obstrucion.  Head: Normocephalic.  Respiratory: Effort normal, non-labored breathing.  Skin: WDI.   Neurologic Examination    NEURO:  Mental Status: Patient is able to state her name and gives her correct age after several attempts Speech/Language: speech is without dysarthria or aphasia.    Cranial Nerves:  II: PERRL.  III, IV, VI: Tracks examiner around the room VII: Smile is symmetrical.  VIII: hearing intact to voice. IX, X: Phonation is normal.  Motor: Able to move bilateral upper extremities with antigravity strength, moves bilateral lower extremities and is able to briefly pick them up off the bed Tone: is normal and bulk is normal Sensation- Intact to light touch  Gait- deferred    Medications  Current Facility-Administered Medications:    0.9 %  sodium chloride  infusion, , Intravenous, Continuous, Mannam, Praveen, MD, Last Rate: 75 mL/hr at 02/02/24 0800, Infusion Verify at 02/02/24 0800   0.9 %  sodium chloride  infusion, 250 mL, Intravenous, Continuous, Ogan, Okoronkwo U, MD, Stopped at 02/02/24 0608   cefTRIAXone  (ROCEPHIN ) 1 g in sodium chloride  0.9 % 100 mL IVPB, 1 g, Intravenous, Q24H,  Elgergawy, Brayton RAMAN, MD, Stopped at 02/02/24 9361   Chlorhexidine Gluconate Cloth 2 % PADS 6 each, 6 each, Topical, Daily, Elgergawy, Brayton RAMAN, MD, 6 each at 02/01/24 1744   diltiazem  (CARDIZEM ) tablet 30 mg, 30 mg, Oral, Q6H, Alva, Rakesh V, MD   Dimethyl Fumarate  CPDR 240 mg, 240 mg, Oral, BID, Georgina Basket, MD, 240 mg at 02/01/24 2257   levothyroxine  (SYNTHROID ) tablet 125 mcg, 125 mcg, Oral, Q0600, Georgina Basket, MD, 125 mcg at 02/02/24 9381   norepinephrine (LEVOPHED) 4mg  in (0.016 mg/mL) premix infusion, 0-10 mcg/min, Intravenous, Titrated, Ogan, Okoronkwo U, MD, Held at 02/01/24 2250   polyethylene glycol (MIRALAX / GLYCOLAX) packet 17 g, 17 g, Oral, Daily PRN, Dagenhart, Jamie H, NP   rosuvastatin  (CRESTOR ) tablet 10 mg, 10 mg, Oral, Daily, Georgina Basket, MD, 10 mg at 02/01/24 9083   sodium chloride  tablet 1 g, 1 g, Oral, BID WC, Alva, Rakesh V, MD   tamsulosin [facility]) capsule 0.4 mg, 0.4 mg, Oral, Daily, Georgina Basket, MD, 0.4 mg at 02/01/24 0916  Labs and Diagnostic Imaging   CBC:  Recent Labs  Lab 02/01/24 1806 02/02/24 0011  WBC 22.6* 22.6*  HGB 12.4 12.4  HCT 35.8* 34.8*  MCV 92.5 91.6  PLT 258 247    Basic Metabolic Panel:  Lab Results  Component Value Date   NA 127 (L) 02/02/2024   K 4.1 02/02/2024   CO2 18 (L) 02/02/2024  GLUCOSE 105 (H) 02/02/2024   BUN 20 02/02/2024   CREATININE 0.94 02/02/2024   CALCIUM  8.6 (L) 02/02/2024   GFRNONAA >60 02/02/2024   GFRAA 94 02/14/2020   Lipid Panel:  Lab Results  Component Value Date   LDLCALC 93 10/26/2018   HgbA1c: No results found for: HGBA1C Urine Drug Screen:     Component Value Date/Time   LABOPIA NONE DETECTED 09/03/2017 0050   COCAINSCRNUR NONE DETECTED 09/03/2017 0050   LABBENZ NONE DETECTED 09/03/2017 0050   AMPHETMU NONE DETECTED 09/03/2017 0050   THCU NONE DETECTED 09/03/2017 0050   LABBARB NONE DETECTED 09/03/2017 0050     CT Head without contrast(Personally reviewed): No acute  abnormality  Continuous EEG 10/29 - 10/30:  - Continuous slow, generalized This study is suggestive of mild generalized cerebral dysfunction (encephalopathy). No seizures or epileptiform discharges were seen throughout the recording.  Assessment   Jillian Fisher is a 78 y.o. female with history of advanced MS, neurogenic bladder, recurrent UTIs, A-fib on Eliquis , hypertension, hyperlipidemia and hypothyroidism who originally presented with fatigue, shortness of breath and hyponatremia.  She was also noted to have hematuria in the setting of a UTI.  Yesterday, she had an episode of decreased responsiveness.  She did improve somewhat with naloxone administration.  Given severe change and concern for respiratory compromise, she was moved to the ICU.  Her sodium was noted to be low at 121 and has since increased to 123.  There was concern for seizure activity, but LTM EEG demonstrated only continuous slowing.  Mental status is noted to be improved today with patient able to state her full name and age.  Suspect that altered mental status yesterday was due to combination of opiate administration, toxic metabolic encephalopathy and hyponatremia.  Recommendations  -Discontinue LTM EEG - Correction of hyponatremia per primary team - Treatment of UTI per primary team - Neurology will sign off, please call with questions or concerns ______________________________________________________________________ Patient seen by NP with MD, MD to edit note as needed.  Signed, Cortney E Everitt Clint Kill, NP [facility]   Attending Neurohospitalist Addendum Patient seen and examined with APP/Resident. Agree with the history and physical as documented above. Agree with the plan as documented, which I helped formulate. I have independently reviewed the chart, obtained history, review of systems and examined the patient.I have personally reviewed pertinent head/neck/spine imaging (CT/MRI). Plan discussed  with Dr. Jude Please feel free to call with any questions.  -- Eligio Lav, MD Neurologist [facility] Pager: [phone]

## 2024-02-02 NOTE — Consult Note (Signed)
 Urology Consult Note   Requesting Attending Physician:  Jude Harden GAILS, MD Service Providing Consult: Urology  Consulting Attending: Dr. Devere   Reason for Consult:  hematuria, urinary retention, neurogenic bladder 2/2 MS  HPI: Jillian Fisher is seen in consultation for reasons noted above at the request of Jude Harden GAILS, MD. Patient is a 78 y.o. female presenting to [facility] emergency department via EMS.  She reported waking up the morning of 01/30/2024 with difficulty breathing, shortness of breath, cough, and congestion.  Family provided cooling humidifier which helped initially.  The next morning patient continued to have difficulty breathing and summoned EMS.  In the emergency department workup revealed hyponatremia - NA 121 and BNP 461, for which she was admitted.  PMH significant for advanced MS, neurogenic bladder, recurrent UTIs, A-fib on Eliquis , HTN, HLD, and hypothyroidism.  While on the floor the afternoon of 10/29, patient began to have a decreased level of consciousness.  Rapid response was called and neurology consulted.  Initial diagnosis was likely multifactorial toxic metabolic encephalopathy.  She was admitted to the intensive care unit.  Patient was found to be in urinary retention and Foley catheter was placed with return of bloody urine.  [facility] was consulted to speak to this finding. ------------------  Assessment:   78 y.o. female with hematuria following stretch injury of bladder while on Eliquis .   Recommendations: # hematuria  # neurogenic bladder 2/2 MS  A formal hematuria workup should include urinalysis, contrasted CT imaging, and cystoscopy.  In this case the most likely culprit is a stretch injury of the bladder wall while patient was receiving Eliquis .  She is presently washing out and catheter is draining bloody red urine.  My hope is that this will be self-limiting.  Will consider additional CT imaging +/- three-way catheter exchange  with CBI in the next 48 hours if she does not show improvement after her washout.  Nursing to hand irrigate catheter as needed.  Trend hemoglobin.  Case and plan discussed with Dr. Devere  Past Medical History: Past Medical History:  Diagnosis Date   Abnormality of gait 01/25/2013   Arthritis    GERD (gastroesophageal reflux disease)    Hypercholesteremia    Hypothyroidism    Multiple sclerosis    Neurogenic bladder 01/25/2013   Trigeminal neuralgia 07/07/2017   Right V3    Past Surgical History:  Past Surgical History:  Procedure Laterality Date   ABDOMINAL HYSTERECTOMY      Medication: Current Facility-Administered Medications  Medication Dose Route Frequency Provider Last Rate Last Admin   0.9 %  sodium chloride  infusion   Intravenous Continuous Jude Harden GAILS, MD 50 mL/hr at 02/02/24 1400 Infusion Verify at 02/02/24 1400   0.9 %  sodium chloride  infusion  250 mL Intravenous Continuous Ogan, Okoronkwo U, MD   Stopped at 02/02/24 0608   cefTRIAXone  (ROCEPHIN ) 1 g in sodium chloride  0.9 % 100 mL IVPB  1 g Intravenous Q24H Elgergawy, Dawood S, MD   Stopped at 02/02/24 9361   Chlorhexidine Gluconate Cloth 2 % PADS 6 each  6 each Topical Daily Elgergawy, Dawood S, MD   6 each at 02/02/24 1009   diltiazem  (CARDIZEM ) tablet 30 mg  30 mg Oral Q6H Alva, Rakesh V, MD   30 mg at 02/02/24 1148   Dimethyl Fumarate  CPDR 240 mg  240 mg Oral BID Georgina Basket, MD   240 mg at 02/02/24 1008   feeding supplement (ENSURE PLUS HIGH PROTEIN) liquid 237  mL  237 mL Oral BID BM Alva, Rakesh V, MD   237 mL at 02/02/24 1350   levothyroxine  (SYNTHROID ) tablet 125 mcg  125 mcg Oral Q0600 Moore, Willie, MD   125 mcg at 02/02/24 0618   polyethylene glycol (MIRALAX / GLYCOLAX) packet 17 g  17 g Oral Daily PRN Dagenhart, Jamie H, NP   17 g at 02/02/24 1255   rosuvastatin  (CRESTOR ) tablet 10 mg  10 mg Oral Daily Georgina Basket, MD   10 mg at 02/02/24 1008   sodium chloride  tablet 1 g  1 g Oral BID WC Alva,  Rakesh V, MD   1 g at 02/02/24 1008   tamsulosin (FLOMAX) capsule 0.4 mg  0.4 mg Oral Daily Georgina Basket, MD   0.4 mg at 02/02/24 1008    Allergies: Allergies  Allergen Reactions   Sulfa Antibiotics Rash    Social History: Social History   Tobacco Use   Smoking status: Former    Current packs/day: 0.00    Average packs/day: 0.3 packs/day for 1 year (0.3 ttl pk-yrs)    Types: Cigarettes    Start date: 08/11/1987    Quit date: 08/10/1988    Years since quitting: 35.5   Smokeless tobacco: Never   Tobacco comments:    quit 30-40 years ago (documented in 2013)  Vaping Use   Vaping status: Never Used  Substance Use Topics   Alcohol use: No    Alcohol/week: 0.0 standard drinks of alcohol   Drug use: No    Family History Family History  Problem Relation Age of Onset   Heart attack Mother    Multiple sclerosis Sister    Heart disease Sister    Diabetes Other     Review of Systems  Genitourinary:  Positive for hematuria. Negative for dysuria, flank pain, frequency and urgency.     Objective   Vital signs in last 24 hours: BP (!) 152/57   Pulse 89   Temp 98.4 F (36.9 C) (Axillary)   Resp (!) 23   Ht 5' 4 (1.626 m)   Wt 65.6 kg   SpO2 93%   BMI 24.82 kg/m   Physical Exam General: A&O, resting, appropriate HEENT: [place]/AT Pulmonary: Normal work of breathing Cardiovascular: no cyanosis Abdomen: Soft, NTTP, nondistended GU: foley catheter in place draining clear yellow urine   Most Recent Labs: Lab Results  Component Value Date   WBC 22.6 (H) 02/02/2024   HGB 12.4 02/02/2024   HCT 34.8 (L) 02/02/2024   PLT 247 02/02/2024    Lab Results  Component Value Date   NA 127 (L) 02/02/2024   K 4.1 02/02/2024   CL 93 (L) 02/02/2024   CO2 18 (L) 02/02/2024   BUN 20 02/02/2024   CREATININE 0.94 02/02/2024   CALCIUM  8.6 (L) 02/02/2024   MG 2.3 02/02/2024   PHOS 4.2 02/02/2024    No results found for: INR, APTT   Urine  Culture: @[id number] (laburin,org,[id number],[id number])@   IMAGING: Overnight EEG with video Result Date: 02/02/2024 Shelton Arlin KIDD, MD     02/02/2024  2:30 PM Patient Name: KRISTEEN LANTZ MRN: [id number] Epilepsy Attending: Arlin KIDD Shelton Referring Physician/Provider: everitt Clint Abbey Earle FORBES, NP Duration: 02/01/2024 1757 to 02/02/2024 0915 Patient history: 78yo M with ams. EEG to evaluate for seizure Level of alertness: Awake, asleep AEDs during EEG study: None Technical aspects: This EEG study was done with scalp electrodes positioned according to the 10-20 International system of electrode placement. Electrical activity was reviewed with  band pass filter of 1-70Hz , sensitivity of 7 uV/mm, display speed of 59mm/sec with a 60Hz  notched filter applied as appropriate. EEG data were recorded continuously and digitally stored.  Video monitoring was available and reviewed as appropriate. Description: The posterior dominant rhythm consists of 7.5 Hz activity of moderate voltage (25-35 uV) seen predominantly in posterior head regions, symmetric and reactive to eye opening and eye closing. Sleep was characterized by vertex waves, sleep spindles (12 to 14 Hz), maximal frontocentral region. EEG showed continuous generalized 5 to 7 Hz theta slowing. Hyperventilation and photic stimulation were not performed.   ABNORMALITY - Continuous slow, generalized IMPRESSION: This study is suggestive of mild generalized cerebral dysfunction (encephalopathy). No seizures or epileptiform discharges were seen throughout the recording. Priyanka MALVA Krebs   CT HEAD WO CONTRAST ( ) Result Date: 02/01/2024 EXAM: CT HEAD WITHOUT CONTRAST 02/01/2024 05:18:00 PM TECHNIQUE: CT of the head was performed without the administration of intravenous contrast. Automated exposure control, iterative reconstruction, and/or weight based adjustment of the mA/kV was utilized to reduce the radiation dose to as low as reasonably achievable. COMPARISON: CT  head 02/14/23 CLINICAL HISTORY: Altered mental status, nontraumatic (Ped 0-17y). Shortness of breath. FINDINGS: BRAIN AND VENTRICLES: No acute hemorrhage. No evidence of acute infarct. No hydrocephalus. No extra-axial collection. No mass effect or midline shift. Similar patchy white matter changes, compatible with chronic microvascular ischemic disease. Similar cerebral atrophy. ORBITS: No acute abnormality. SINUSES: No acute abnormality. SOFT TISSUES AND SKULL: No skull fracture. IMPRESSION: 1. No acute intracranial abnormality. Electronically signed by: Gilmore Molt MD 02/01/2024 06:03 PM EDT RP Workstation: [id number]   ECHOCARDIOGRAM COMPLETE BUBBLE STUDY Result Date: 02/01/2024    ECHOCARDIOGRAM REPORT   Patient Name:   LAILAH MARCELLI Date of Exam: 02/01/2024 Medical Rec #:  [id number]         Height:       63.0 in Accession #:    [id number]        Weight:       153.0 lb Date of Birth:  [birth year]         BSA:          1.726 m Patient Age:    78 years          BP:           172/60 mmHg Patient Gender: F                 HR:           80 bpm. Exam Location:  Inpatient Procedure: 2D Echo, Cardiac Doppler, Color Doppler and Saline Contrast Bubble            Study (Both Spectral and Color Flow Doppler were utilized during            procedure). Indications:    DOE  History:        Patient has no prior history of Echocardiogram examinations.                 Arrythmias:Atrial Fibrillation, Signs/Symptoms:Dyspnea and                 Shortness of Breath; Risk Factors:Hypertension, Dyslipidemia and                 Former Smoker.  Sonographer:    Juliene Rucks Referring Phys: [id number] MARSHA ADA  Sonographer Comments: Image acquisition challenging due to uncooperative patient. IMPRESSIONS  1. Left ventricular ejection fraction, by estimation, is >75%. The left ventricle has hyperdynamic  function. The left ventricle has no regional wall motion abnormalities. There is mild left ventricular hypertrophy. Left  ventricular diastolic function could not be evaluated.  2. Right ventricular systolic function is normal. The right ventricular size is mildly enlarged. There is severely elevated pulmonary artery systolic pressure. The estimated right ventricular systolic pressure is 68.5 mmHg.  3. Right atrial size was severely dilated.  4. The mitral valve is grossly normal. Trivial mitral valve regurgitation.  5. Tricuspid valve regurgitation is mild to moderate.  6. The aortic valve is tricuspid. Aortic valve regurgitation is mild. Aortic valve sclerosis/calcification is present, without any evidence of aortic stenosis.  7. The inferior vena cava is normal in size with <50% respiratory variability, suggesting right atrial pressure of 8 mmHg.  8. Agitated saline contrast bubble study was negative, with no evidence of any interatrial shunt. Comparison(s): No prior Echocardiogram. FINDINGS  Left Ventricle: Left ventricular ejection fraction, by estimation, is >75%. The left ventricle has hyperdynamic function. The left ventricle has no regional wall motion abnormalities. The left ventricular internal cavity size was normal in size. There is mild left ventricular hypertrophy. Left ventricular diastolic function could not be evaluated due to atrial fibrillation. Left ventricular diastolic function could not be evaluated. Right Ventricle: The right ventricular size is mildly enlarged. No increase in right ventricular wall thickness. Right ventricular systolic function is normal. There is severely elevated pulmonary artery systolic pressure. The tricuspid regurgitant velocity is 3.89 m/s, and with an assumed right atrial pressure of 8 mmHg, the estimated right ventricular systolic pressure is 68.5 mmHg. Left Atrium: Left atrial size was normal in size. Right Atrium: Right atrial size was severely dilated. Pericardium: There is no evidence of pericardial effusion. Mitral Valve: The mitral valve is grossly normal. Trivial mitral valve  regurgitation. Tricuspid Valve: The tricuspid valve is grossly normal. Tricuspid valve regurgitation is mild to moderate. Aortic Valve: The aortic valve is tricuspid. Aortic valve regurgitation is mild. Aortic valve sclerosis/calcification is present, without any evidence of aortic stenosis. Pulmonic Valve: The pulmonic valve was grossly normal. Pulmonic valve regurgitation is trivial. Aorta: The aortic root and ascending aorta are structurally normal, with no evidence of dilitation. Venous: The inferior vena cava is normal in size with less than 50% respiratory variability, suggesting right atrial pressure of 8 mmHg. IAS/Shunts: No atrial level shunt detected by color flow Doppler. Agitated saline contrast was given intravenously to evaluate for intracardiac shunting. Agitated saline contrast bubble study was negative, with no evidence of any interatrial shunt.  IVC IVC diam: 1.90 cm LEFT ATRIUM             Index        RIGHT ATRIUM           Index LA Vol (A2C):   47.7 ml 27.64 ml/m  RA Area:     26.20 cm LA Vol (A4C):   54.1 ml 31.35 ml/m  RA Volume:   86.10 ml  49.90 ml/m LA Biplane Vol: 52.1 ml 30.19 ml/m  TRICUSPID VALVE TR Peak grad:   60.5 mmHg TR Vmax:        389.00 cm/s Vinie Maxcy MD Electronically signed by Vinie Maxcy MD Signature Date/Time: 02/01/2024/12:26:35 PM    Final     ------  Ole Bourdon, NP Pager: [phone]   Please contact the urology consult pager with any further questions/concerns.

## 2024-02-02 NOTE — Evaluation (Signed)
 Clinical/Bedside Swallow Evaluation Patient Details  Name: TUESDAY TERLECKI MRN: [id number] Date of Birth: 01/04/1946  Today's Date: 02/02/2024 Time: SLP Start Time (ACUTE ONLY): 0900 SLP Stop Time (ACUTE ONLY): 0918 SLP Time Calculation (min) (ACUTE ONLY): 18 min  Past Medical History:  Past Medical History:  Diagnosis Date   Abnormality of gait 01/25/2013   Arthritis    GERD (gastroesophageal reflux disease)    Hypercholesteremia    Hypothyroidism    Multiple sclerosis    Neurogenic bladder 01/25/2013   Trigeminal neuralgia 07/07/2017   Right V3   Past Surgical History:  Past Surgical History:  Procedure Laterality Date   ABDOMINAL HYSTERECTOMY     HPI:  KRYSSA RISENHOOVER is a 78 y.o. female with hx of advanced MS, neurogenic bladder, trigeminal neuralgia, recurrent UTIs, A-fib on Eliquis , hypertension, hyperlipidemia and hypothyroidism who originally was admitted with fatigue, hyponatremia and shortness of breath and today had an episode of altered mental status with decreased responsiveness. Head CT negative. Per MD, likely multifactorial toxic metabolic encephalopathy in a patient with poor brain reserve from MS. CXR from 10/28 with stable cardiomegaly. Patient seen at bedside by SLP in 2019 with recommendations for dysphagia 3, thin liquids.    Assessment / Plan / Recommendation  Clinical Impression  Patient presents with a functional oropharyngeal swallow. Oral phase normal with efficient control of liquids and mastication of solids. Pharyngeally, timing and strength of swallow appears normal for age and patient without overt indication of aspiration with the exception of 1 x subtle cough post swallow with mulitple consecutive sips of thin liquid via straw. Additional trials of thin liquids, again challenged with serial swallows of thin liquid via straw, consumed without indication of aspiration. Day time caregiver present in room and reports no difficulty swallowing prior to  admission. Will initiate a regular diet but f/u briefly for tolerance given subtle signs of aspiration at bedside today. RN aware. SLP Visit Diagnosis: Dysphagia, unspecified (R13.10)    Aspiration Risk  Mild aspiration risk    Diet Recommendation Regular;Thin liquid    Liquid Administration via: Cup;Straw Medication Administration: Whole meds with liquid Supervision: Patient able to self feed;Intermittent supervision to cue for compensatory strategies Compensations: Small sips/bites;Slow rate Postural Changes: Seated upright at 90 degrees    Other  Recommendations Oral Care Recommendations: Oral care BID     Functional Status Assessment Patient has had a recent decline in their functional status and demonstrates the ability to make significant improvements in function in a reasonable and predictable amount of time.  Frequency and Duration min 1 x/week  1 week        Swallow Study   General HPI: LATORIA DRY is a 78 y.o. female with hx of advanced MS, neurogenic bladder, trigeminal neuralgia, recurrent UTIs, A-fib on Eliquis , hypertension, hyperlipidemia and hypothyroidism who originally was admitted with fatigue, hyponatremia and shortness of breath and today had an episode of altered mental status with decreased responsiveness. Head CT negative. Per MD, likely multifactorial toxic metabolic encephalopathy in a patient with poor brain reserve from MS. CXR from 10/28 with stable cardiomegaly. Patient seen at bedside by SLP in 2019 with recommendations for dysphagia 3, thin liquids. Type of Study: Bedside Swallow Evaluation Previous Swallow Assessment: SEE hpi Diet Prior to this Study: NPO Temperature Spikes Noted: No Respiratory Status: Room air History of Recent Intubation: No Behavior/Cognition: Alert;Cooperative;Pleasant mood Oral Cavity Assessment: Within Functional Limits Oral Care Completed by SLP: Yes Oral Cavity - Dentition: Missing dentition;Poor condition;Dentures,  not available Vision: Functional for self-feeding Self-Feeding Abilities: Able to feed self Patient Positioning: Upright in bed Baseline Vocal Quality: Low vocal intensity Volitional Cough: Weak Volitional Swallow: Able to elicit    Oral/Motor/Sensory Function Overall Oral Motor/Sensory Function: Mild impairment Facial ROM: Within Functional Limits Facial Symmetry: Abnormal symmetry right Facial Strength: Within Functional Limits Facial Sensation: Within Functional Limits Lingual ROM: Within Functional Limits Lingual Symmetry: Abnormal symmetry right Lingual Strength: Within Functional Limits Lingual Sensation: Within Functional Limits Velum: Within Functional Limits Mandible: Within Functional Limits   Ice Chips Ice chips: Within functional limits Presentation: Spoon   Thin Liquid Thin Liquid: Impaired Presentation: Cup;Straw;Self Fed Pharyngeal  Phase Impairments: Cough - Immediate    Nectar Thick Nectar Thick Liquid: Not tested   Honey Thick Honey Thick Liquid: Not tested   Puree Puree: Within functional limits Presentation: Spoon   Solid     Solid: Within functional limits Presentation: Self Fed     Senna Lape MA, CCC-SLP  Halcyon Heck Meryl 02/02/2024,9:24 AM

## 2024-02-03 DIAGNOSIS — E871 Hypo-osmolality and hyponatremia: Secondary | ICD-10-CM | POA: Diagnosis not present

## 2024-02-03 LAB — BASIC METABOLIC PANEL WITH GFR
Anion gap: 9 (ref 5–15)
Anion gap: 13 (ref 5–15)
Anion gap: 30 — ABNORMAL HIGH (ref 5–15)
BUN: 15 mg/dL (ref 8–23)
BUN: 10 mg/dL (ref 8–23)
BUN: 8 mg/dL (ref 8–23)
CO2: 21 mmol/L — ABNORMAL LOW (ref 22–32)
CO2: 19 mmol/L — ABNORMAL LOW (ref 22–32)
CO2: 18 mmol/L — ABNORMAL LOW (ref 22–32)
Calcium: 8.3 mg/dL — ABNORMAL LOW (ref 8.9–10.3)
Calcium: 8.5 mg/dL — ABNORMAL LOW (ref 8.9–10.3)
Calcium: 7.4 mg/dL — ABNORMAL LOW (ref 8.9–10.3)
Chloride: 94 mmol/L — ABNORMAL LOW (ref 98–111)
Chloride: 96 mmol/L — ABNORMAL LOW (ref 98–111)
Chloride: 97 mmol/L — ABNORMAL LOW (ref 98–111)
Creatinine, Ser: 0.62 mg/dL (ref 0.44–1.00)
Creatinine, Ser: 0.44 mg/dL (ref 0.44–1.00)
Creatinine, Ser: 0.52 mg/dL (ref 0.44–1.00)
GFR, Estimated: >60 mL/min (ref >60)
GFR, Estimated: >60 mL/min (ref >60)
GFR, Estimated: >60 mL/min (ref >60)
Glucose, Bld: 125 mg/dL — ABNORMAL HIGH (ref 70–99)
Glucose, Bld: 136 mg/dL — ABNORMAL HIGH (ref 70–99)
Glucose, Bld: 134 mg/dL — ABNORMAL HIGH (ref 70–99)
Potassium: 4.1 mmol/L (ref 3.5–5.1)
Potassium: 4 mmol/L (ref 3.5–5.1)
Potassium: 3.4 mmol/L — ABNORMAL LOW (ref 3.5–5.1)
Sodium: 124 mmol/L — ABNORMAL LOW (ref 135–145)
Sodium: 128 mmol/L — ABNORMAL LOW (ref 135–145)
Sodium: 145 mmol/L (ref 135–145)

## 2024-02-03 LAB — SODIUM
Sodium: 127 mmol/L — ABNORMAL LOW (ref 135–145)
Sodium: 129 mmol/L — ABNORMAL LOW (ref 135–145)

## 2024-02-03 LAB — URINE CULTURE: Culture: >=100000 — AB

## 2024-02-03 LAB — CBC
HCT: 31.1 % — ABNORMAL LOW (ref 36.0–46.0)
Hemoglobin: 11.2 g/dL — ABNORMAL LOW (ref 12.0–15.0)
MCH: 32.9 pg (ref 26.0–34.0)
MCHC: 36 g/dL (ref 30.0–36.0)
MCV: 91.5 fL (ref 80.0–100.0)
Platelets: 253 K/uL (ref 150–400)
RBC: 3.4 MIL/uL — ABNORMAL LOW (ref 3.87–5.11)
RDW: 12.9 % (ref 11.5–15.5)
WBC: 16.4 K/uL — ABNORMAL HIGH (ref 4.0–10.5)
nRBC: 0 % (ref 0.0–0.2)

## 2024-02-03 LAB — MAGNESIUM: Magnesium: 1.7 mg/dL (ref 1.7–2.4)

## 2024-02-03 LAB — PHOSPHORUS: Phosphorus: 2 mg/dL — ABNORMAL LOW (ref 2.5–4.6)

## 2024-02-03 MED ORDER — SODIUM PHOSPHATES 45 MMOLE/15ML IV SOLN
30.0000 mmol | Freq: Once | INTRAVENOUS | Status: AC
  Administered 2024-02-03: 30 mmol via INTRAVENOUS
  Filled 2024-02-03: qty 10

## 2024-02-03 NOTE — Progress Notes (Signed)
 Subjective: Patient has been transferred to the floor.  She was resting in bed in good spirits.  Urine has significantly cleared with clear yellow urine in tubing  Objective: Vital signs in last 24 hours: Temp:  [97.4 F (36.3 C)-99.1 F (37.3 C)] 97.4 F (36.3 C) (10/31 0954) Pulse Rate:  [62-96] 86 (10/31 0954) Resp:  [15-23] 16 (10/31 0954) BP: (118-178)/(44-74) 136/57 (10/31 0954) SpO2:  [91 %-99 %] 97 % (10/31 0954) Weight:  [71.9 kg] 71.9 kg (10/31 0500)  Assessment/Plan: # hematuria  # neurogenic bladder 2/2 MS  Multifactorial.  Presumed stretch injury 2/2 neurogenic urinary retention while receiving Eliquis .  Also found to have Aerococcus urinae UTI. Culture directed ABX x 7-10 days. Urine in bag is predominantly clear and tubing is clear yellow today.  She is progressing very well compared to yesterday night.  No change to plan.  Outpatient follow-up for cystoscopy.  Please call with questions  Intake/Output from previous day: 10/30 0701 - 10/31 0700 In: 911.6 [P.O.:297; I.V.:564.6] Out: 1425 [Urine:1425]  Intake/Output this shift: No intake/output data recorded.  Physical Exam:  General: Alert and oriented CV: No cyanosis Lungs: equal chest rise Gu: Foley catheter in place draining clear yellow urine  Lab Results: Recent Labs    02/01/24 1806 02/02/24 0011 02/03/24 0445  HGB 12.4 12.4 11.2*  HCT 35.8* 34.8* 31.1*   BMET Recent Labs    02/02/24 0011 02/02/24 0806 02/03/24 0445 02/03/24 0831  NA 123*   < > 124* 127*  K 4.1  --  4.1  --   CL 93*  --  94*  --   CO2 18*  --  21*  --   GLUCOSE 105*  --  125*  --   BUN 20  --  15  --   CREATININE 0.94  --  0.62  --   CALCIUM  8.6*  --  8.3*  --   HGB 12.4  --  11.2*  --   WBC 22.6*  --  16.4*  --    < > = values in this interval not displayed.     Studies/Results: Overnight EEG with video Result Date: 02/02/2024 Shelton Arlin KIDD, MD     02/02/2024  2:30 PM Patient Name: Jillian Fisher  MRN: [id number] Epilepsy Attending: Arlin KIDD Shelton Referring Physician/Provider: everitt Clint Abbey Earle FORBES, NP Duration: 02/01/2024 1757 to 02/02/2024 0915 Patient history: 78yo M with ams. EEG to evaluate for seizure Level of alertness: Awake, asleep AEDs during EEG study: None Technical aspects: This EEG study was done with scalp electrodes positioned according to the 10-20 International system of electrode placement. Electrical activity was reviewed with band pass filter of 1-70Hz , sensitivity of 7 uV/mm, display speed of 1mm/sec with a 60Hz  notched filter applied as appropriate. EEG data were recorded continuously and digitally stored.  Video monitoring was available and reviewed as appropriate. Description: The posterior dominant rhythm consists of 7.5 Hz activity of moderate voltage (25-35 uV) seen predominantly in posterior head regions, symmetric and reactive to eye opening and eye closing. Sleep was characterized by vertex waves, sleep spindles (12 to 14 Hz), maximal frontocentral region. EEG showed continuous generalized 5 to 7 Hz theta slowing. Hyperventilation and photic stimulation were not performed.   ABNORMALITY - Continuous slow, generalized IMPRESSION: This study is suggestive of mild generalized cerebral dysfunction (encephalopathy). No seizures or epileptiform discharges were seen throughout the recording. Priyanka KIDD Shelton   CT HEAD WO CONTRAST ( ) Result Date: 02/01/2024  EXAM: CT HEAD WITHOUT CONTRAST 02/01/2024 05:18:00 PM TECHNIQUE: CT of the head was performed without the administration of intravenous contrast. Automated exposure control, iterative reconstruction, and/or weight based adjustment of the mA/kV was utilized to reduce the radiation dose to as low as reasonably achievable. COMPARISON: CT head 02/14/23 CLINICAL HISTORY: Altered mental status, nontraumatic (Ped 0-17y). Shortness of breath. FINDINGS: BRAIN AND VENTRICLES: No acute hemorrhage. No evidence of acute infarct. No  hydrocephalus. No extra-axial collection. No mass effect or midline shift. Similar patchy white matter changes, compatible with chronic microvascular ischemic disease. Similar cerebral atrophy. ORBITS: No acute abnormality. SINUSES: No acute abnormality. SOFT TISSUES AND SKULL: No skull fracture. IMPRESSION: 1. No acute intracranial abnormality. Electronically signed by: Gilmore Molt MD 02/01/2024 06:03 PM EDT RP Workstation: [id number]      LOS: 3 days   Ole Bourdon, NP [facility] Pager: [phone]  02/03/2024, 1:11 PM

## 2024-02-03 NOTE — Progress Notes (Signed)
 PROGRESS NOTE    Jillian Fisher  FMW:[id number] DOB: [birth year] DOA: 01/31/2024 PCP: Leonel Cole, MD    Brief Narrative:  Jillian Fisher is seen in consultation for reasons noted above at the request of Jude Harden GAILS, MD. Patient is a 78 y.o. female presenting to [facility] emergency department via EMS.  She reported waking up the morning of 01/30/2024 with difficulty breathing, shortness of breath, cough, and congestion.  Family provided cooling humidifier which helped initially.  The next morning patient continued to have difficulty breathing and summoned EMS.  In the emergency department workup revealed hyponatremia - NA 121 and BNP 461, for which she was admitted.  PMH significant for advanced MS, neurogenic bladder, recurrent UTIs, A-fib on Eliquis , HTN, HLD, and hypothyroidism.   While on the floor the afternoon of 10/29, patient began to have a decreased level of consciousness. Rapid response was called and neurology consulted. Initial diagnosis was likely multifactorial toxic metabolic encephalopathy. She was admitted to the intensive care unit. Patient was found to be in urinary retention and Foley catheter was placed with return of bloody urine.   Assessment and Plan:   Acute metabolic encephalopathy; multifactorial in setting of hemorrhagic cystitis/UTI vs possible MS flare , resolving -Neurology consulted and have signed off - Head CT and EEG negative -BG normal; ammonia 15 -TSH normal at 2.4    Hyponatremia unclear etiology; -Urine/serum osms/sodium consistent with SIADH multifactorial w/ Trileptal  use vs diuretics vs infectious process -Sodium 121 on admission; now improved to 127  -Placed on salt tab   Acute respiratory insufficiency in setting of Afib SVR -Oxygenating well on 2L [place]   Hypophosphatemia -replete   Elevated BNP C/f acute CHF Chronic Afib on Eliquis ; Bradycardia requiring atropine Hypertension TTE: EF >75%; hyperdynamic. No wma. Mild LVH.  Mildly enlarged RV, severely elevated PA pressures, RVP 68.5; RAP 8. No clinical signs of volume overload.  -Hold metoprolol , will resume Cardizem  30 every 6 and watch heart rate -Will hold Eliquis  in setting of hematuria; although improving per RN reports   Hemorrhagic cystitis Neurogenic bladder Possible UTI -Continue to monitor H/H -Monitor strict I&O via foley -Monitor renal function on daily labs -Urology consulted for hematuria  -UA +; UC pending >> empiric ceftriaxone    Hypothyroidism -Continue home synthroid      Trigeminal neuralgia MS Neurogenic bladder -PTA Dimethyl fumerate 240mg  BID -PTA flomax 0.4mg  daily - Wheelchair dependent at baseline - PT eval     DVT prophylaxis: SCDs Start: 02/01/24 1645    Code Status: Full Code   Disposition Plan:  Level of care: Telemetry Status is: Inpatient     Consultants:  PCCM Neurology   Subjective: No shortness of breath  Objective: Vitals:   02/03/24 0257 02/03/24 0500 02/03/24 0517 02/03/24 0954  BP: (!) 161/46  (!) 150/59 (!) 136/57  Pulse: 63  87 86  Resp:    16  Temp:   98.6 F (37 C) (!) 97.4 F (36.3 C)  TempSrc:   Oral Oral  SpO2: 95%  96% 97%  Weight:  71.9 kg    Height:        Intake/Output Summary (Last 24 hours) at 02/03/2024 1233 Last data filed at 02/03/2024 0516 Gross per 24 hour  Intake 427 ml  Output 775 ml  Net -348 ml   Filed Weights   02/01/24 1731 02/02/24 0500 02/03/24 0500  Weight: 65.6 kg 65.6 kg 71.9 kg    Examination:   General: Appearance:  Overweight female in no acute distress     Lungs:     respirations unlabored  Heart:    Normal heart rate. Irregularly irregular rhythm.    MS:   All extremities are intact.    Neurologic:   Awake, alert-oriented to person place but not time       Data Reviewed: I have personally reviewed following labs and imaging studies  CBC: Recent Labs  Lab 01/31/24 0939 02/01/24 0328 02/01/24 1806 02/02/24 0011  02/03/24 0445  WBC 6.3  --  22.6* 22.6* 16.4*  HGB 13.2 12.8 12.4 12.4 11.2*  HCT 37.6 35.4* 35.8* 34.8* 31.1*  MCV 91.9  --  92.5 91.6 91.5  PLT 261  --  258 247 253   Basic Metabolic Panel: Recent Labs  Lab 01/31/24 0939 02/01/24 0328 02/01/24 1806 02/02/24 0011 02/02/24 0806 02/02/24 1531 02/02/24 2320 02/03/24 0445 02/03/24 0831  NA 121* 122* 123* 123* 127* 127* 126* 124* 127*  K 4.6 4.2 4.2 4.1  --   --   --  4.1  --   CL 89* 90* 89* 93*  --   --   --  94*  --   CO2 19* 19* 19* 18*  --   --   --  21*  --   GLUCOSE 184* 116* 116* 105*  --   --   --  125*  --   BUN 15 14 19 20   --   --   --  15  --   CREATININE 0.53 0.64 1.10* 0.94  --   --   --  0.62  --   CALCIUM  9.1 8.8* 8.6* 8.6*  --   --   --  8.3*  --   MG  --  1.6* 2.4 2.3  --   --   --  1.7  --   PHOS  --  3.7 4.1 4.2  --   --   --  2.0*  --    GFR: Estimated Creatinine Clearance: 57.3 mL/min (by C-G formula based on SCr of 0.62 mg/dL). Liver Function Tests: Recent Labs  Lab 01/31/24 0939 02/01/24 1806  AST 36 20  ALT 23 19  ALKPHOS 84 66  BILITOT 0.8 0.9  PROT 8.4* 7.6  ALBUMIN 3.8 3.3*   No results for input(s): LIPASE, AMYLASE in the last 168 hours. Recent Labs  Lab 02/01/24 1806  AMMONIA 15   Coagulation Profile: No results for input(s): INR, PROTIME in the last 168 hours. Cardiac Enzymes: No results for input(s): CKTOTAL, CKMB, CKMBINDEX, TROPONINI in the last 168 hours. BNP (last 3 results) No results for input(s): PROBNP in the last 8760 hours. HbA1C: No results for input(s): HGBA1C in the last 72 hours. CBG: Recent Labs  Lab 02/01/24 1621 02/01/24 1738 02/01/24 1923 02/01/24 2308 02/02/24 0306  GLUCAP 147* 140* 110* 99 98   Lipid Profile: No results for input(s): CHOL, HDL, LDLCALC, TRIG, CHOLHDL, LDLDIRECT in the last 72 hours. Thyroid  Function Tests: Recent Labs    02/01/24 0328  TSH 2.424   Anemia Panel: No results for input(s):  VITAMINB12, FOLATE, FERRITIN, TIBC, IRON, RETICCTPCT in the last 72 hours. Sepsis Labs: Recent Labs  Lab 02/01/24 0328  PROCALCITON <0.10    Recent Results (from the past 240 hours)  Resp panel by RT-PCR (RSV, Flu A&B, Covid) Anterior Nasal Swab     Status: None   Collection Time: 01/31/24  8:06 AM   Specimen: Anterior Nasal Swab  Result Value Ref  Range Status   SARS Coronavirus 2 by RT PCR NEGATIVE NEGATIVE Final   Influenza A by PCR NEGATIVE NEGATIVE Final   Influenza B by PCR NEGATIVE NEGATIVE Final    Comment: (NOTE) The Xpert Xpress SARS-CoV-2/FLU/RSV plus assay is intended as an aid in the diagnosis of influenza from Nasopharyngeal swab specimens and should not be used as a sole basis for treatment. Nasal washings and aspirates are unacceptable for Xpert Xpress SARS-CoV-2/FLU/RSV testing.  Fact Sheet for Patients: bloggercourse.com  Fact Sheet for Healthcare Providers: seriousbroker.it  This test is not yet approved or cleared by the [place]  FDA and has been authorized for detection and/or diagnosis of SARS-CoV-2 by FDA under an Emergency Use Authorization (EUA). This EUA will remain in effect (meaning this test can be used) for the duration of the COVID-19 declaration under Section 564(b)(1) of the Act, 21 U.S.C. section 360bbb-3(b)(1), unless the authorization is terminated or revoked.     Resp Syncytial Virus by PCR NEGATIVE NEGATIVE Final    Comment: (NOTE) Fact Sheet for Patients: bloggercourse.com  Fact Sheet for Healthcare Providers: seriousbroker.it  This test is not yet approved or cleared by the [place]  FDA and has been authorized for detection and/or diagnosis of SARS-CoV-2 by FDA under an Emergency Use Authorization (EUA). This EUA will remain in effect (meaning this test can be used) for the duration of the COVID-19 declaration  under Section 564(b)(1) of the Act, 21 U.S.C. section 360bbb-3(b)(1), unless the authorization is terminated or revoked.  Performed at [facility], [address], [place], [place] [address]   Urine Culture (for pregnant, neutropenic or urologic patients or patients with an indwelling urinary catheter)     Status: Abnormal   Collection Time: 02/01/24  6:00 AM   Specimen: Urine, Clean Catch  Result Value Ref Range Status   Specimen Description URINE, CLEAN CATCH  Final   Special Requests NONE  Final   Culture (A)  Final    >=100,000 COLONIES/mL AEROCOCCUS URINAE Standardized susceptibility testing for this organism is not available. Performed at [facility], [address] [place], [place] [address]    Report Status 02/03/2024 FINAL  Final  MRSA Next Gen by PCR, Nasal     Status: None   Collection Time: 02/01/24  4:45 PM   Specimen: Nasal Swab  Result Value Ref Range Status   MRSA by PCR Next Gen NOT DETECTED NOT DETECTED Final    Comment: (NOTE) The GeneXpert MRSA Assay (FDA approved for NASAL specimens only), is one component of a comprehensive MRSA colonization surveillance program. It is not intended to diagnose MRSA infection nor to guide or monitor treatment for MRSA infections. Test performance is not FDA approved in patients less than 62 years old. Performed at [facility], [address], [place], [place] [address]          Radiology Studies: Overnight EEG with video Result Date: 02/02/2024 Shelton Arlin KIDD, MD     02/02/2024  2:30 PM Patient Name: HAVA MASSINGALE MRN: [id number] Epilepsy Attending: Arlin KIDD Shelton Referring Physician/Provider: everitt Clint Abbey Earle FORBES, NP Duration: 02/01/2024 1757 to 02/02/2024 0915 Patient history: 78yo M with ams. EEG to evaluate for seizure Level of alertness: Awake, asleep AEDs during EEG study: None Technical aspects: This EEG study was done with scalp electrodes positioned according to the 10-20  International system of electrode placement. Electrical activity was reviewed with band pass filter of 1-70Hz , sensitivity of 7 uV/mm, display speed  of 45mm/sec with a 60Hz  notched filter applied as appropriate. EEG data were recorded continuously and digitally stored.  Video monitoring was available and reviewed as appropriate. Description: The posterior dominant rhythm consists of 7.5 Hz activity of moderate voltage (25-35 uV) seen predominantly in posterior head regions, symmetric and reactive to eye opening and eye closing. Sleep was characterized by vertex waves, sleep spindles (12 to 14 Hz), maximal frontocentral region. EEG showed continuous generalized 5 to 7 Hz theta slowing. Hyperventilation and photic stimulation were not performed.   ABNORMALITY - Continuous slow, generalized IMPRESSION: This study is suggestive of mild generalized cerebral dysfunction (encephalopathy). No seizures or epileptiform discharges were seen throughout the recording. Priyanka MALVA Krebs   CT HEAD WO CONTRAST ( ) Result Date: 02/01/2024 EXAM: CT HEAD WITHOUT CONTRAST 02/01/2024 05:18:00 PM TECHNIQUE: CT of the head was performed without the administration of intravenous contrast. Automated exposure control, iterative reconstruction, and/or weight based adjustment of the mA/kV was utilized to reduce the radiation dose to as low as reasonably achievable. COMPARISON: CT head 02/14/23 CLINICAL HISTORY: Altered mental status, nontraumatic (Ped 0-17y). Shortness of breath. FINDINGS: BRAIN AND VENTRICLES: No acute hemorrhage. No evidence of acute infarct. No hydrocephalus. No extra-axial collection. No mass effect or midline shift. Similar patchy white matter changes, compatible with chronic microvascular ischemic disease. Similar cerebral atrophy. ORBITS: No acute abnormality. SINUSES: No acute abnormality. SOFT TISSUES AND SKULL: No skull fracture. IMPRESSION: 1. No acute intracranial abnormality. Electronically signed by:  Gilmore Molt MD 02/01/2024 06:03 PM EDT RP Workstation: [id number]        Scheduled Meds:  Chlorhexidine Gluconate Cloth  6 each Topical Daily   diltiazem   30 mg Oral Q6H   Dimethyl Fumarate   240 mg Oral BID   feeding supplement  237 mL Oral BID BM   gabapentin   300 mg Oral Q8H   levothyroxine   125 mcg Oral Q0600   rosuvastatin   10 mg Oral Daily   sodium chloride   1 g Oral BID WC   tamsulosin  0.4 mg Oral Daily   Continuous Infusions:  cefTRIAXone  (ROCEPHIN )  IV 1 g (02/03/24 0516)   sodium PHOSPHATE  IVPB (in mmol) 30 mmol (02/03/24 0950)     LOS: 3 days    Time spent: 45 minutes spent on chart review, discussion with nursing staff, consultants, updating family and interview/physical exam; more than 50% of that time was spent in counseling and/or coordination of care.    Harlene RAYMOND Bowl, DO [facility] Available via Epic secure chat 7am-7pm After these hours, please refer to coverage provider listed on [email or web address] 02/03/2024, 12:33 PM

## 2024-02-03 NOTE — Plan of Care (Addendum)
 Flushed the foley's catheter twice ,present small blood clots in the output  but the catheter still patent .   Problem: Clinical Measurements: Goal: Respiratory complications will improve Outcome: Progressing   Problem: Nutrition: Goal: Adequate nutrition will be maintained Outcome: Progressing   Problem: Pain Managment: Goal: General experience of comfort will improve and/or be controlled Outcome: Progressing   Problem: Safety: Goal: Ability to remain free from injury will improve Outcome: Progressing

## 2024-02-03 NOTE — Progress Notes (Signed)
 Speech Language Pathology Treatment: Dysphagia  Patient Details Name: Jillian Fisher MRN: [id number] DOB: 09-27-1945 Today's Date: 02/03/2024 Time: 0940-1000 SLP Time Calculation (min) (ACUTE ONLY): 20 min  Assessment / Plan / Recommendation Clinical Impression  Pt demonstrates no signs of dysphagia, no coughing. Able to masticate most regular foods, but said bacon would be too difficult. Pt to continue current diet. No SLP f/u needed will sign off.   HPI HPI: Jillian Fisher is a 78 y.o. female with hx of advanced MS, neurogenic bladder, trigeminal neuralgia, recurrent UTIs, A-fib on Eliquis , hypertension, hyperlipidemia and hypothyroidism who originally was admitted with fatigue, hyponatremia and shortness of breath and today had an episode of altered mental status with decreased responsiveness. Head CT negative. Per MD, likely multifactorial toxic metabolic encephalopathy in a patient with poor brain reserve from MS. CXR from 10/28 with stable cardiomegaly. Patient seen at bedside by SLP in 2019 with recommendations for dysphagia 3, thin liquids.      SLP Plan  All goals met          Recommendations  Diet recommendations: Regular;Thin liquid Liquids provided via: Cup;Straw Medication Administration: Whole meds with liquid Supervision: Staff to assist with self feeding Compensations: Small sips/bites;Slow rate Postural Changes and/or Swallow Maneuvers: Seated upright 90 degrees                  Oral care BID     Dysphagia, unspecified (R13.10)     All goals met     Jillian Fisher, Jillian Fisher  02/03/2024, 10:44 AM

## 2024-02-03 NOTE — Plan of Care (Signed)
  Problem: Education: Goal: Knowledge of General Education information will improve Description: Including pain rating scale, medication(s)/side effects and non-pharmacologic comfort measures Outcome: Progressing   Problem: Health Behavior/Discharge Planning: Goal: Ability to manage health-related needs will improve Outcome: Progressing   Problem: Clinical Measurements: Goal: Ability to maintain clinical measurements within normal limits will improve Outcome: Progressing Goal: Will remain free from infection Outcome: Progressing Goal: Diagnostic test results will improve Outcome: Progressing Goal: Respiratory complications will improve Outcome: Progressing Goal: Cardiovascular complication will be avoided Outcome: Progressing   Problem: Activity: Goal: Risk for activity intolerance will decrease Outcome: Progressing   Problem: Nutrition: Goal: Adequate nutrition will be maintained Outcome: Progressing   Problem: Elimination: Goal: Will not experience complications related to bowel motility Outcome: Progressing Goal: Will not experience complications related to urinary retention Outcome: Progressing   Problem: Pain Managment: Goal: General experience of comfort will improve and/or be controlled Outcome: Progressing   Problem: Safety: Goal: Ability to remain free from injury will improve Outcome: Progressing

## 2024-02-03 NOTE — Progress Notes (Signed)
   02/03/24 1243  Spiritual Encounters  Type of Visit Initial  Care provided to: Patient  Conversation partners present during encounter Nurse  Reason for visit Advance directives   The chaplain visited the patient in [phone] for the purpose of getting an Advance Directive filled out. The patient has the document filled out and needs a notary and witnesses to complete it.

## 2024-02-04 DIAGNOSIS — E871 Hypo-osmolality and hyponatremia: Secondary | ICD-10-CM | POA: Diagnosis not present

## 2024-02-04 LAB — SODIUM: Sodium: 130 mmol/L — ABNORMAL LOW (ref 135–145)

## 2024-02-04 LAB — CBC
HCT: 34 % — ABNORMAL LOW (ref 36.0–46.0)
Hemoglobin: 11.8 g/dL — ABNORMAL LOW (ref 12.0–15.0)
MCH: 31.6 pg (ref 26.0–34.0)
MCHC: 34.7 g/dL (ref 30.0–36.0)
MCV: 90.9 fL (ref 80.0–100.0)
Platelets: 323 K/uL (ref 150–400)
RBC: 3.74 MIL/uL — ABNORMAL LOW (ref 3.87–5.11)
RDW: 12.9 % (ref 11.5–15.5)
WBC: 11.2 K/uL — ABNORMAL HIGH (ref 4.0–10.5)
nRBC: 0 % (ref 0.0–0.2)

## 2024-02-04 LAB — BASIC METABOLIC PANEL WITH GFR
Anion gap: 13 (ref 5–15)
BUN: 5 mg/dL — ABNORMAL LOW (ref 8–23)
CO2: 20 mmol/L — ABNORMAL LOW (ref 22–32)
Calcium: 8.7 mg/dL — ABNORMAL LOW (ref 8.9–10.3)
Chloride: 99 mmol/L (ref 98–111)
Creatinine, Ser: 0.39 mg/dL — ABNORMAL LOW (ref 0.44–1.00)
GFR, Estimated: >60 mL/min (ref >60)
Glucose, Bld: 136 mg/dL — ABNORMAL HIGH (ref 70–99)
Potassium: 3.6 mmol/L (ref 3.5–5.1)
Sodium: 132 mmol/L — ABNORMAL LOW (ref 135–145)

## 2024-02-04 LAB — PHOSPHORUS: Phosphorus: 2.5 mg/dL (ref 2.5–4.6)

## 2024-02-04 LAB — MAGNESIUM: Magnesium: 1.7 mg/dL (ref 1.7–2.4)

## 2024-02-04 MED ORDER — SODIUM CHLORIDE 1 G PO TABS
1.0000 g | ORAL_TABLET | Freq: Every day | ORAL | Status: DC
Start: 2024-02-05 — End: 2024-02-06
  Administered 2024-02-05 – 2024-02-06 (×2): 1 g via ORAL
  Filled 2024-02-04 (×2): qty 1

## 2024-02-04 MED ORDER — APIXABAN 5 MG PO TABS
5.0000 mg | ORAL_TABLET | Freq: Two times a day (BID) | ORAL | Status: DC
  Administered 2024-02-04 – 2024-02-06 (×5): 5 mg via ORAL
  Filled 2024-02-04 (×5): qty 1

## 2024-02-04 MED ORDER — MAGNESIUM SULFATE 2 GM/50ML IV SOLN
2.0000 g | Freq: Once | INTRAVENOUS | Status: AC
  Administered 2024-02-04: 2 g via INTRAVENOUS
  Filled 2024-02-04: qty 50

## 2024-02-04 NOTE — Progress Notes (Signed)
 PROGRESS NOTE    Jillian Fisher  FMW:[id number] DOB: March 26, 1946 DOA: 01/31/2024 PCP: Leonel Cole, MD    Brief Narrative:  Jillian Fisher is seen in consultation for reasons noted above at the request of Jude Harden GAILS, MD. Patient is a 78 y.o. female presenting to [facility] emergency department via EMS.  She reported waking up the morning of 01/30/2024 with difficulty breathing, shortness of breath, cough, and congestion.  Family provided cooling humidifier which helped initially.  The next morning patient continued to have difficulty breathing and summoned EMS.  In the emergency department workup revealed hyponatremia - NA 121 and BNP 461, for which she was admitted.  PMH significant for advanced MS, neurogenic bladder, recurrent UTIs, A-fib on Eliquis , HTN, HLD, and hypothyroidism.   While on the floor the afternoon of 10/29, patient began to have a decreased level of consciousness. Rapid response was called and neurology consulted. Initial diagnosis was likely multifactorial toxic metabolic encephalopathy. She was admitted to the intensive care unit. Patient was found to be in urinary retention and Foley catheter was placed with return of bloody urine.   Patient did not have a Foley from home so will need to clarify with urology if the plan is to discharge her home with a Foley.  Assessment and Plan:   Acute metabolic encephalopathy; multifactorial in setting of hemorrhagic cystitis/UTI vs possible MS flare , resolving -Neurology consulted and have signed off - Head CT and EEG negative -BG normal; ammonia 15 -TSH normal at 2.4    Hyponatremia unclear etiology; -Urine/serum osms/sodium consistent with SIADH multifactorial w/ Trileptal  use vs diuretics vs infectious process -Sodium 121 on admission; now improved -Placed on salt tab-changed to daily from twice daily   Acute respiratory insufficiency in setting of Afib SVR - Off O2   Hypophosphatemia -replete   Elevated  BNP C/f acute CHF Chronic Afib on Eliquis ; Bradycardia requiring atropine Hypertension TTE: EF >75%; hyperdynamic. No wma. Mild LVH. Mildly enlarged RV, severely elevated PA pressures, RVP 68.5; RAP 8. No clinical signs of volume overload.  -Hold metoprolol , will resume Cardizem  30 every 6 and watch heart rate -Resume Eliquis    Hemorrhagic cystitis Neurogenic bladder Possible UTI -Continue to monitor H/H -Monitor strict I&O via foley -Monitor renal function on daily labs -Urology consulted for hematuria  -UA +; UC Aerococcus   Hypothyroidism -Continue home synthroid      Trigeminal neuralgia MS Neurogenic bladder -PTA Dimethyl fumerate 240mg  BID -PTA flomax 0.4mg  daily - Wheelchair dependent at baseline - PT eval-no follow-up   Suspect patient can be discharged Monday afternoon or Tuesday-Will need to clarify with urology if Foley is needed at home prior to discharge.  DVT prophylaxis: SCDs Start: 02/01/24 1645 Updated daughter   Code Status: Full Code   Disposition Plan:  Level of care: Telemetry Status is: Inpatient     Consultants:  PCCM Neurology   Subjective: No chest pain  Objective: Vitals:   02/03/24 2218 02/04/24 0500 02/04/24 0551 02/04/24 1000  BP: (!) 187/69  (!) 157/60 (!) 123/53  Pulse: (!) 106  95 83  Resp: 18  16 16   Temp: 98 F (36.7 C)  97.7 F (36.5 C) 98.2 F (36.8 C)  TempSrc: Oral   Oral  SpO2: 93%  95% 96%  Weight:  71.5 kg    Height:        Intake/Output Summary (Last 24 hours) at 02/04/2024 1122 Last data filed at 02/03/2024 2000 Gross per 24 hour  Intake 395.22 ml  Output 950 ml  Net -554.78 ml   Filed Weights   02/02/24 0500 02/03/24 0500 02/04/24 0500  Weight: 65.6 kg 71.9 kg 71.5 kg    Examination:   General: Appearance:     Overweight female in no acute distress     Lungs:     respirations unlabored  Heart:    Normal heart rate. Irregularly irregular rhythm.    MS:   All extremities are intact.     Neurologic:   Awake, alert-oriented to person place but not time       Data Reviewed: I have personally reviewed following labs and imaging studies  CBC: Recent Labs  Lab 01/31/24 0939 02/01/24 0328 02/01/24 1806 02/02/24 0011 02/03/24 0445 02/04/24 0617  WBC 6.3  --  22.6* 22.6* 16.4* 11.2*  HGB 13.2 12.8 12.4 12.4 11.2* 11.8*  HCT 37.6 35.4* 35.8* 34.8* 31.1* 34.0*  MCV 91.9  --  92.5 91.6 91.5 90.9  PLT 261  --  258 247 253 323   Basic Metabolic Panel: Recent Labs  Lab 02/01/24 0328 02/01/24 1806 02/02/24 0011 02/02/24 0806 02/03/24 0445 02/03/24 0831 02/03/24 1555 02/03/24 1834 02/04/24 0008 02/04/24 0617 02/04/24 0810  NA 122* 123* 123*   < > 124* 127* 129*  128* 145 130*  --  132*  K 4.2 4.2 4.1  --  4.1  --  4.0 3.4*  --   --  3.6  CL 90* 89* 93*  --  94*  --  96* 97*  --   --  99  CO2 19* 19* 18*  --  21*  --  19* 18*  --   --  20*  GLUCOSE 116* 116* 105*  --  125*  --  136* 134*  --   --  136*  BUN 14 19 20   --  15  --  10 8  --   --  5*  CREATININE 0.64 1.10* 0.94  --  0.62  --  0.44 0.52  --   --  0.39*  CALCIUM  8.8* 8.6* 8.6*  --  8.3*  --  8.5* 7.4*  --   --  8.7*  MG 1.6* 2.4 2.3  --  1.7  --   --   --   --  1.7  --   PHOS 3.7 4.1 4.2  --  2.0*  --   --   --   --  2.5  --    < > = values in this interval not displayed.   GFR: Estimated Creatinine Clearance: 57.1 mL/min (A) (by C-G formula based on SCr of 0.39 mg/dL (L)). Liver Function Tests: Recent Labs  Lab 01/31/24 0939 02/01/24 1806  AST 36 20  ALT 23 19  ALKPHOS 84 66  BILITOT 0.8 0.9  PROT 8.4* 7.6  ALBUMIN 3.8 3.3*   No results for input(s): LIPASE, AMYLASE in the last 168 hours. Recent Labs  Lab 02/01/24 1806  AMMONIA 15   Coagulation Profile: No results for input(s): INR, PROTIME in the last 168 hours. Cardiac Enzymes: No results for input(s): CKTOTAL, CKMB, CKMBINDEX, TROPONINI in the last 168 hours. BNP (last 3 results) No results for input(s):  PROBNP in the last 8760 hours. HbA1C: No results for input(s): HGBA1C in the last 72 hours. CBG: Recent Labs  Lab 02/01/24 1621 02/01/24 1738 02/01/24 1923 02/01/24 2308 02/02/24 0306  GLUCAP 147* 140* 110* 99 98   Lipid Profile: No results for input(s): CHOL, HDL, LDLCALC, TRIG, CHOLHDL, LDLDIRECT  in the last 72 hours. Thyroid  Function Tests: No results for input(s): TSH, T4TOTAL, FREET4, T3FREE, THYROIDAB in the last 72 hours.  Anemia Panel: No results for input(s): VITAMINB12, FOLATE, FERRITIN, TIBC, IRON, RETICCTPCT in the last 72 hours. Sepsis Labs: Recent Labs  Lab 02/01/24 0328  PROCALCITON <0.10    Recent Results (from the past 240 hours)  Resp panel by RT-PCR (RSV, Flu A&B, Covid) Anterior Nasal Swab     Status: None   Collection Time: 01/31/24  8:06 AM   Specimen: Anterior Nasal Swab  Result Value Ref Range Status   SARS Coronavirus 2 by RT PCR NEGATIVE NEGATIVE Final   Influenza A by PCR NEGATIVE NEGATIVE Final   Influenza B by PCR NEGATIVE NEGATIVE Final    Comment: (NOTE) The Xpert Xpress SARS-CoV-2/FLU/RSV plus assay is intended as an aid in the diagnosis of influenza from Nasopharyngeal swab specimens and should not be used as a sole basis for treatment. Nasal washings and aspirates are unacceptable for Xpert Xpress SARS-CoV-2/FLU/RSV testing.  Fact Sheet for Patients: bloggercourse.com  Fact Sheet for Healthcare Providers: seriousbroker.it  This test is not yet approved or cleared by the [place]  FDA and has been authorized for detection and/or diagnosis of SARS-CoV-2 by FDA under an Emergency Use Authorization (EUA). This EUA will remain in effect (meaning this test can be used) for the duration of the COVID-19 declaration under Section 564(b)(1) of the Act, 21 U.S.C. section 360bbb-3(b)(1), unless the authorization is terminated or revoked.     Resp  Syncytial Virus by PCR NEGATIVE NEGATIVE Final    Comment: (NOTE) Fact Sheet for Patients: bloggercourse.com  Fact Sheet for Healthcare Providers: seriousbroker.it  This test is not yet approved or cleared by the [place]  FDA and has been authorized for detection and/or diagnosis of SARS-CoV-2 by FDA under an Emergency Use Authorization (EUA). This EUA will remain in effect (meaning this test can be used) for the duration of the COVID-19 declaration under Section 564(b)(1) of the Act, 21 U.S.C. section 360bbb-3(b)(1), unless the authorization is terminated or revoked.  Performed at [facility], [address], [place], [place] [address]   Urine Culture (for pregnant, neutropenic or urologic patients or patients with an indwelling urinary catheter)     Status: Abnormal   Collection Time: 02/01/24  6:00 AM   Specimen: Urine, Clean Catch  Result Value Ref Range Status   Specimen Description URINE, CLEAN CATCH  Final   Special Requests NONE  Final   Culture (A)  Final    >=100,000 COLONIES/mL AEROCOCCUS URINAE Standardized susceptibility testing for this organism is not available. Performed at [facility], [address], [place], [place] [address]    Report Status 02/03/2024 FINAL  Final  MRSA Next Gen by PCR, Nasal     Status: None   Collection Time: 02/01/24  4:45 PM   Specimen: Nasal Swab  Result Value Ref Range Status   MRSA by PCR Next Gen NOT DETECTED NOT DETECTED Final    Comment: (NOTE) The GeneXpert MRSA Assay (FDA approved for NASAL specimens only), is one component of a comprehensive MRSA colonization surveillance program. It is not intended to diagnose MRSA infection nor to guide or monitor treatment for MRSA infections. Test performance is not FDA approved in patients less than 58 years old. Performed at [facility], [address], [place], [place] [address]          Radiology  Studies: No results found.  Scheduled Meds:  Chlorhexidine Gluconate Cloth  6 each Topical Daily   diltiazem   30 mg Oral Q6H   Dimethyl Fumarate   240 mg Oral BID   feeding supplement  237 mL Oral BID BM   gabapentin   300 mg Oral Q8H   levothyroxine   125 mcg Oral Q0600   rosuvastatin   10 mg Oral Daily   sodium chloride   1 g Oral BID WC   tamsulosin  0.4 mg Oral Daily   Continuous Infusions:  cefTRIAXone  (ROCEPHIN )  IV 1 g (02/04/24 0515)   magnesium  sulfate bolus IVPB       LOS: 4 days    Time spent: 45 minutes spent on chart review, discussion with nursing staff, consultants, updating family and interview/physical exam; more than 50% of that time was spent in counseling and/or coordination of care.    Harlene RAYMOND Bowl, DO [facility] Available via Epic secure chat 7am-7pm After these hours, please refer to coverage provider listed on [email or web address] 02/04/2024, 11:22 AM

## 2024-02-04 NOTE — Plan of Care (Signed)
   Problem: Nutrition: Goal: Adequate nutrition will be maintained Outcome: Progressing

## 2024-02-04 NOTE — Plan of Care (Signed)

## 2024-02-05 DIAGNOSIS — E871 Hypo-osmolality and hyponatremia: Secondary | ICD-10-CM | POA: Diagnosis not present

## 2024-02-05 LAB — CBC
HCT: 34.9 % — ABNORMAL LOW (ref 36.0–46.0)
Hemoglobin: 11.9 g/dL — ABNORMAL LOW (ref 12.0–15.0)
MCH: 31.8 pg (ref 26.0–34.0)
MCHC: 34.1 g/dL (ref 30.0–36.0)
MCV: 93.3 fL (ref 80.0–100.0)
Platelets: 324 K/uL (ref 150–400)
RBC: 3.74 MIL/uL — ABNORMAL LOW (ref 3.87–5.11)
RDW: 13.2 % (ref 11.5–15.5)
WBC: 9.8 K/uL (ref 4.0–10.5)
nRBC: 0 % (ref 0.0–0.2)

## 2024-02-05 LAB — BASIC METABOLIC PANEL WITH GFR
Anion gap: 13 (ref 5–15)
BUN: <5 mg/dL — ABNORMAL LOW (ref 8–23)
CO2: 19 mmol/L — ABNORMAL LOW (ref 22–32)
Calcium: 8.7 mg/dL — ABNORMAL LOW (ref 8.9–10.3)
Chloride: 101 mmol/L (ref 98–111)
Creatinine, Ser: 0.42 mg/dL — ABNORMAL LOW (ref 0.44–1.00)
GFR, Estimated: >60 mL/min (ref >60)
Glucose, Bld: 143 mg/dL — ABNORMAL HIGH (ref 70–99)
Potassium: 3.4 mmol/L — ABNORMAL LOW (ref 3.5–5.1)
Sodium: 133 mmol/L — ABNORMAL LOW (ref 135–145)

## 2024-02-05 LAB — MAGNESIUM: Magnesium: 1.8 mg/dL (ref 1.7–2.4)

## 2024-02-05 LAB — PHOSPHORUS: Phosphorus: 2.8 mg/dL (ref 2.5–4.6)

## 2024-02-05 MED ORDER — POTASSIUM CHLORIDE CRYS ER 20 MEQ PO TBCR
40.0000 meq | EXTENDED_RELEASE_TABLET | Freq: Once | ORAL | Status: AC
  Administered 2024-02-05: 40 meq via ORAL
  Filled 2024-02-05: qty 2

## 2024-02-05 NOTE — Progress Notes (Signed)
 PROGRESS NOTE    Jillian Fisher  FMW:[id number] DOB: 07/22/1945 DOA: 01/31/2024 PCP: Leonel Cole, MD    Brief Narrative:  Jillian Fisher is seen in consultation for reasons noted above at the request of Jude Harden GAILS, MD. Patient is a 78 y.o. female presenting to [facility] emergency department via EMS.  She reported waking up the morning of 01/30/2024 with difficulty breathing, shortness of breath, cough, and congestion.  Family provided cooling humidifier which helped initially.  The next morning patient continued to have difficulty breathing and summoned EMS.  In the emergency department workup revealed hyponatremia - NA 121 and BNP 461, for which she was admitted.  PMH significant for advanced MS, neurogenic bladder, recurrent UTIs, A-fib on Eliquis , HTN, HLD, and hypothyroidism.   While on the floor the afternoon of 10/29, patient began to have a decreased level of consciousness. Rapid response was called and neurology consulted. Initial diagnosis was likely multifactorial toxic metabolic encephalopathy. She was admitted to the intensive care unit. Patient was found to be in urinary retention and Foley catheter was placed with return of bloody urine.   Patient did not have a Foley from home so will need to clarify with urology if the plan is to discharge her home with a Foley.  Assessment and Plan:   Acute metabolic encephalopathy; multifactorial in setting of hemorrhagic cystitis/UTI vs possible MS flare , resolving -Neurology consulted and have signed off - Head CT and EEG negative -BG normal; ammonia 15 -TSH normal at 2.4    Hyponatremia unclear etiology; -Urine/serum osms/sodium consistent with SIADH multifactorial w/ Trileptal  use vs diuretics vs infectious process -Sodium 121 on admission; now improved -Placed on salt tab-changed to daily from twice daily   Acute respiratory insufficiency in setting of Afib SVR - Off O2   Hypophosphatemia -replete   Elevated  BNP C/f acute CHF Chronic Afib on Eliquis ; Bradycardia requiring atropine Hypertension TTE: EF >75%; hyperdynamic. No wma. Mild LVH. Mildly enlarged RV, severely elevated PA pressures, RVP 68.5; RAP 8. No clinical signs of volume overload.  -Hold metoprolol , will resume Cardizem  30 every 6 and watch heart rate -Resume Eliquis    Hemorrhagic cystitis Neurogenic bladder Possible UTI -Continue to monitor H/H -Monitor strict I&O via foley -Monitor renal function on daily labs -Urology consulted for hematuria  -UA +; UC Aerococcus   Hypothyroidism -Continue home synthroid      Trigeminal neuralgia MS Neurogenic bladder -PTA Dimethyl fumerate 240mg  BID -PTA flomax 0.4mg  daily - Wheelchair dependent at baseline - PT eval-no follow-up   Suspect patient can be discharged Monday--- Will need to clarify with urology if Foley is needed at home prior to discharge.  DVT prophylaxis: SCDs Start: 02/01/24 1645 apixaban  (ELIQUIS ) tablet 5 mg Updated daughter   Code Status: Full Code   Disposition Plan:  Level of care: Telemetry Status is: Inpatient     Consultants:  PCCM Neurology   Subjective: Ate some oatmeal  Objective: Vitals:   02/04/24 1952 02/05/24 0500 02/05/24 0535 02/05/24 0956  BP: (!) 129/54  (!) 156/77 (!) 146/60  Pulse: 76  88 68  Resp: 16   16  Temp: 98.6 F (37 C)  98 F (36.7 C) 98.1 F (36.7 C)  TempSrc: Oral  Oral Oral  SpO2: 95%  95% 98%  Weight:  71.4 kg    Height:        Intake/Output Summary (Last 24 hours) at 02/05/2024 1136 Last data filed at 02/05/2024 0815 Gross per 24 hour  Intake  630 ml  Output 1300 ml  Net -670 ml   Filed Weights   02/03/24 0500 02/04/24 0500 02/05/24 0500  Weight: 71.9 kg 71.5 kg 71.4 kg    Examination:   General: Appearance:     Overweight female in no acute distress     Lungs:     respirations unlabored  Heart:    Normal heart rate. Irregularly irregular rhythm.    MS:   All extremities are intact.     Neurologic:   Awake, alert-oriented to person place but not time       Data Reviewed: I have personally reviewed following labs and imaging studies  CBC: Recent Labs  Lab 02/01/24 1806 02/02/24 0011 02/03/24 0445 02/04/24 0617 02/05/24 0640  WBC 22.6* 22.6* 16.4* 11.2* 9.8  HGB 12.4 12.4 11.2* 11.8* 11.9*  HCT 35.8* 34.8* 31.1* 34.0* 34.9*  MCV 92.5 91.6 91.5 90.9 93.3  PLT 258 247 253 323 324   Basic Metabolic Panel: Recent Labs  Lab 02/01/24 1806 02/02/24 0011 02/02/24 0806 02/03/24 0445 02/03/24 0831 02/03/24 1555 02/03/24 1834 02/04/24 0008 02/04/24 0617 02/04/24 0810 02/05/24 0640  NA 123* 123*   < > 124*   < > 129*  128* 145 130*  --  132* 133*  K 4.2 4.1  --  4.1  --  4.0 3.4*  --   --  3.6 3.4*  CL 89* 93*  --  94*  --  96* 97*  --   --  99 101  CO2 19* 18*  --  21*  --  19* 18*  --   --  20* 19*  GLUCOSE 116* 105*  --  125*  --  136* 134*  --   --  136* 143*  BUN 19 20  --  15  --  10 8  --   --  5* <5*  CREATININE 1.10* 0.94  --  0.62  --  0.44 0.52  --   --  0.39* 0.42*  CALCIUM  8.6* 8.6*  --  8.3*  --  8.5* 7.4*  --   --  8.7* 8.7*  MG 2.4 2.3  --  1.7  --   --   --   --  1.7  --  1.8  PHOS 4.1 4.2  --  2.0*  --   --   --   --  2.5  --  2.8   < > = values in this interval not displayed.   GFR: Estimated Creatinine Clearance: 57.1 mL/min (A) (by C-G formula based on SCr of 0.42 mg/dL (L)). Liver Function Tests: Recent Labs  Lab 01/31/24 0939 02/01/24 1806  AST 36 20  ALT 23 19  ALKPHOS 84 66  BILITOT 0.8 0.9  PROT 8.4* 7.6  ALBUMIN 3.8 3.3*   No results for input(s): LIPASE, AMYLASE in the last 168 hours. Recent Labs  Lab 02/01/24 1806  AMMONIA 15   Coagulation Profile: No results for input(s): INR, PROTIME in the last 168 hours. Cardiac Enzymes: No results for input(s): CKTOTAL, CKMB, CKMBINDEX, TROPONINI in the last 168 hours. BNP (last 3 results) No results for input(s): PROBNP in the last 8760  hours. HbA1C: No results for input(s): HGBA1C in the last 72 hours. CBG: Recent Labs  Lab 02/01/24 1621 02/01/24 1738 02/01/24 1923 02/01/24 2308 02/02/24 0306  GLUCAP 147* 140* 110* 99 98   Lipid Profile: No results for input(s): CHOL, HDL, LDLCALC, TRIG, CHOLHDL, LDLDIRECT in the last 72 hours. Thyroid  Function  Tests: No results for input(s): TSH, T4TOTAL, FREET4, T3FREE, THYROIDAB in the last 72 hours.  Anemia Panel: No results for input(s): VITAMINB12, FOLATE, FERRITIN, TIBC, IRON, RETICCTPCT in the last 72 hours. Sepsis Labs: Recent Labs  Lab 02/01/24 0328  PROCALCITON <0.10    Recent Results (from the past 240 hours)  Resp panel by RT-PCR (RSV, Flu A&B, Covid) Anterior Nasal Swab     Status: None   Collection Time: 01/31/24  8:06 AM   Specimen: Anterior Nasal Swab  Result Value Ref Range Status   SARS Coronavirus 2 by RT PCR NEGATIVE NEGATIVE Final   Influenza A by PCR NEGATIVE NEGATIVE Final   Influenza B by PCR NEGATIVE NEGATIVE Final    Comment: (NOTE) The Xpert Xpress SARS-CoV-2/FLU/RSV plus assay is intended as an aid in the diagnosis of influenza from Nasopharyngeal swab specimens and should not be used as a sole basis for treatment. Nasal washings and aspirates are unacceptable for Xpert Xpress SARS-CoV-2/FLU/RSV testing.  Fact Sheet for Patients: bloggercourse.com  Fact Sheet for Healthcare Providers: seriousbroker.it  This test is not yet approved or cleared by the [place]  FDA and has been authorized for detection and/or diagnosis of SARS-CoV-2 by FDA under an Emergency Use Authorization (EUA). This EUA will remain in effect (meaning this test can be used) for the duration of the COVID-19 declaration under Section 564(b)(1) of the Act, 21 U.S.C. section 360bbb-3(b)(1), unless the authorization is terminated or revoked.     Resp Syncytial Virus by PCR  NEGATIVE NEGATIVE Final    Comment: (NOTE) Fact Sheet for Patients: bloggercourse.com  Fact Sheet for Healthcare Providers: seriousbroker.it  This test is not yet approved or cleared by the [place]  FDA and has been authorized for detection and/or diagnosis of SARS-CoV-2 by FDA under an Emergency Use Authorization (EUA). This EUA will remain in effect (meaning this test can be used) for the duration of the COVID-19 declaration under Section 564(b)(1) of the Act, 21 U.S.C. section 360bbb-3(b)(1), unless the authorization is terminated or revoked.  Performed at [facility], [address], [place], [place] [address]   Urine Culture (for pregnant, neutropenic or urologic patients or patients with an indwelling urinary catheter)     Status: Abnormal   Collection Time: 02/01/24  6:00 AM   Specimen: Urine, Clean Catch  Result Value Ref Range Status   Specimen Description URINE, CLEAN CATCH  Final   Special Requests NONE  Final   Culture (A)  Final    >=100,000 COLONIES/mL AEROCOCCUS URINAE Standardized susceptibility testing for this organism is not available. Performed at [facility], [address], [place], [place] [address]    Report Status 02/03/2024 FINAL  Final  MRSA Next Gen by PCR, Nasal     Status: None   Collection Time: 02/01/24  4:45 PM   Specimen: Nasal Swab  Result Value Ref Range Status   MRSA by PCR Next Gen NOT DETECTED NOT DETECTED Final    Comment: (NOTE) The GeneXpert MRSA Assay (FDA approved for NASAL specimens only), is one component of a comprehensive MRSA colonization surveillance program. It is not intended to diagnose MRSA infection nor to guide or monitor treatment for MRSA infections. Test performance is not FDA approved in patients less than 64 years old. Performed at [facility], [address] [place], [place] [address]          Radiology Studies: No results  found.       Scheduled Meds:  apixaban   5 mg Oral BID   Chlorhexidine Gluconate Cloth  6 each Topical Daily   diltiazem   30 mg Oral Q6H   Dimethyl Fumarate   240 mg Oral BID   feeding supplement  237 mL Oral BID BM   gabapentin   300 mg Oral Q8H   levothyroxine   125 mcg Oral Q0600   rosuvastatin   10 mg Oral Daily   sodium chloride   1 g Oral Daily   tamsulosin  0.4 mg Oral Daily   Continuous Infusions:     LOS: 5 days    Time spent: 45 minutes spent on chart review, discussion with nursing staff, consultants, updating family and interview/physical exam; more than 50% of that time was spent in counseling and/or coordination of care.    Harlene RAYMOND Bowl, DO [facility] Available via Epic secure chat 7am-7pm After these hours, please refer to coverage provider listed on [email or web address] 02/05/2024, 11:36 AM

## 2024-02-05 NOTE — Plan of Care (Signed)
   Problem: Education: Goal: Knowledge of General Education information will improve Description Including pain rating scale, medication(s)/side effects and non-pharmacologic comfort measures Outcome: Progressing

## 2024-02-05 NOTE — Plan of Care (Signed)
  Problem: Pain Managment: Goal: General experience of comfort will improve and/or be controlled Outcome: Progressing   Problem: Safety: Goal: Ability to remain free from injury will improve Outcome: Progressing   Problem: Skin Integrity: Goal: Risk for impaired skin integrity will decrease Outcome: Progressing

## 2024-02-06 ENCOUNTER — Other Ambulatory Visit (HOSPITAL_COMMUNITY): Payer: Self-pay

## 2024-02-06 DIAGNOSIS — E871 Hypo-osmolality and hyponatremia: Secondary | ICD-10-CM | POA: Diagnosis not present

## 2024-02-06 LAB — BASIC METABOLIC PANEL WITH GFR
Anion gap: 11 (ref 5–15)
BUN: 5 mg/dL — ABNORMAL LOW (ref 8–23)
CO2: 20 mmol/L — ABNORMAL LOW (ref 22–32)
Calcium: 8.7 mg/dL — ABNORMAL LOW (ref 8.9–10.3)
Chloride: 103 mmol/L (ref 98–111)
Creatinine, Ser: 0.57 mg/dL (ref 0.44–1.00)
GFR, Estimated: >60 mL/min (ref >60)
Glucose, Bld: 118 mg/dL — ABNORMAL HIGH (ref 70–99)
Potassium: 4.5 mmol/L (ref 3.5–5.1)
Sodium: 134 mmol/L — ABNORMAL LOW (ref 135–145)

## 2024-02-06 MED ORDER — OXCARBAZEPINE 300 MG PO TABS: 300.0000 mg | ORAL_TABLET | Freq: Two times a day (BID) | ORAL | Status: AC

## 2024-02-06 MED ORDER — ENSURE PLUS HIGH PROTEIN PO LIQD: 237.0000 mL | Freq: Two times a day (BID) | ORAL | Status: AC

## 2024-02-06 MED ORDER — SODIUM CHLORIDE 1 G PO TABS
1.0000 g | ORAL_TABLET | Freq: Every day | ORAL | 0 refills | Status: AC
  Filled 2024-02-06: qty 30, 30d supply, fill #0

## 2024-02-06 NOTE — TOC CM/SW Note (Signed)
    Durable Medical Equipment  (From admission, onward)           Start     Ordered   02/06/24 0935  For home use only DME standard manual wheelchair with seat cushion  Once       Comments: Patient suffers from Multiple sclerosis  which impairs their ability to perform daily activities like ambulating  in the home.  A cane  will not resolve issue with performing activities of daily living. A wheelchair will allow patient to safely perform daily activities. Patient can safely propel the wheelchair in the home or has a caregiver who can provide assistance. Length of need lifetime . Accessories: elevating leg rests (ELRs), wheel locks, extensions and anti-tippers.  Seat and back cushions   02/06/24 0935

## 2024-02-06 NOTE — Discharge Summary (Signed)
 Physician Discharge Summary  Jillian Fisher FMW:[id number] DOB: November 29, 1945 DOA: 01/31/2024  PCP: Leonel Cole, MD  Admit date: 01/31/2024 Discharge date: 02/06/2024  Admitted From:  Discharge disposition: Home   Recommendations for Outpatient Follow-Up:   Outpatient urology follow-up for attempted Foley removal BMP 1 week-May need adjustment of medications specifically salt tabs and Trileptal    Discharge Diagnosis:   Principal Problem:   Hyponatremia Active Problems:   Acute metabolic encephalopathy    Discharge Condition: Improved.  Diet recommendation: Soft diet  Wound care: None.  Code status: Full.   History of Present Illness:   Jillian Fisher is a 78 y.o. female with medical history significant of advanced MS, neurogenic bladder, recurrent UTIs, atrial fibrillation on anticoagulation, hypertension, hyperlipidemia and hypothyroidism who p/w fatigue and hyponatremia.   The patient experienced difficulty breathing, which started yesterday morning.Of note, her SOB was accompanied by cough and congestion; thus her RN and family, placed her on a cooling humidifier, which initially seemed to help, and provided some relief. However, this morning, despite taking her medications, the patient continued to feel like she could not breathe, and her family activated EMS.   In the ED, AFVSS. Labs notable for Na 121, and BNP 461. CXR with mild cephalization c/f pulmonary edema. EDP requested admission for hyponatremia.     Hospital Course by Problem:   Acute metabolic encephalopathy; multifactorial in setting of hemorrhagic cystitis/UTI vs possible MS flare , resolving -Neurology consulted and have signed off - Head CT and EEG negative -BG normal; ammonia 15 -TSH normal at 2.4 UTI treated -Patient back to baseline     Hyponatremia unclear etiology; -Urine/serum osms/sodium consistent with SIADH multifactorial w/ Trileptal  use vs diuretics vs infectious  process -Sodium 121 on admission; now improved -Placed on salt tab-changed to daily from twice daily with continued improvement - Will require close outpatient follow-up with PCP   Acute respiratory insufficiency  - Off O2   Hypophosphatemia -replete   Elevated BNP C/f acute CHF Chronic Afib on Eliquis ; Bradycardia requiring atropine Hypertension TTE: EF >75%; hyperdynamic. No wma. Mild LVH. Mildly enlarged RV, severely elevated PA pressures, RVP 68.5; RAP 8. No clinical signs of volume overload.  -Hold metoprolol , will resume Cardizem  30 every 6 and watch heart rate -Resume Eliquis    Hemorrhagic cystitis Neurogenic bladder Possible UTI -Urology consulted for hematuria-they have signed off -UA +; UC Aerococcus-treated - Spoke with urology, the plan is for patient to discharge home with Foley and follow-up with urology to decide if voiding trial is needed -Home health    Hypothyroidism -Continue home synthroid      Trigeminal neuralgia MS Neurogenic bladder -PTA Dimethyl fumerate 240mg  BID -PTA flomax 0.4mg  daily - Wheelchair dependent at baseline - PT eval-no follow-up --Resume Trileptal  at a lower dose-Will need close follow-up again with PCP for sodium    Medical Consultants:   Neurology PCCM Urology   Discharge Exam:   Vitals:   02/05/24 2133 02/06/24 0518  BP: (!) 139/57 (!) 133/56  Pulse: 66 79  Resp: 16 18  Temp: 98.1 F (36.7 C) 98.4 F (36.9 C)  SpO2: 99% 97%   Vitals:   02/05/24 0956 02/05/24 1739 02/05/24 2133 02/06/24 0518  BP: (!) 146/60 (!) 111/51 (!) 139/57 (!) 133/56  Pulse: 68 67 66 79  Resp: 16 16 16 18   Temp: 98.1 F (36.7 C) 97.8 F (36.6 C) 98.1 F (36.7 C) 98.4 F (36.9 C)  TempSrc: Oral Oral Oral Oral  SpO2:  98% 99% 99% 97%  Weight:    70.2 kg  Height:        General exam: Appears calm and comfortable.    The results of significant diagnostics from this hospitalization (including imaging, microbiology, ancillary  and laboratory) are listed below for reference.     Procedures and Diagnostic Studies:   CT HEAD WO CONTRAST ( ) Result Date: 02/01/2024 EXAM: CT HEAD WITHOUT CONTRAST 02/01/2024 05:18:00 PM TECHNIQUE: CT of the head was performed without the administration of intravenous contrast. Automated exposure control, iterative reconstruction, and/or weight based adjustment of the mA/kV was utilized to reduce the radiation dose to as low as reasonably achievable. COMPARISON: CT head 02/14/23 CLINICAL HISTORY: Altered mental status, nontraumatic (Ped 0-17y). Shortness of breath. FINDINGS: BRAIN AND VENTRICLES: No acute hemorrhage. No evidence of acute infarct. No hydrocephalus. No extra-axial collection. No mass effect or midline shift. Similar patchy white matter changes, compatible with chronic microvascular ischemic disease. Similar cerebral atrophy. ORBITS: No acute abnormality. SINUSES: No acute abnormality. SOFT TISSUES AND SKULL: No skull fracture. IMPRESSION: 1. No acute intracranial abnormality. Electronically signed by: Gilmore Molt MD 02/01/2024 06:03 PM EDT RP Workstation: [id number]   ECHOCARDIOGRAM COMPLETE BUBBLE STUDY Result Date: 02/01/2024    ECHOCARDIOGRAM REPORT   [name] Date of Exam: 02/01/2024 Medical Rec #:  [id number]         Height:       63.0 in Accession #:    [id number]        Weight:       153.0 lb Date of Birth:  [birth year]         BSA:          1.726 m Patient Age:    78 years          BP:           172/60 mmHg Patient Gender: F                 HR:           80 bpm. Exam Location:  Inpatient Procedure: 2D Echo, Cardiac Doppler, Color Doppler and Saline Contrast Bubble            Study (Both Spectral and Color Flow Doppler were utilized during            procedure). Indications:    DOE  History:        Patient has no prior history of Echocardiogram examinations.                 Arrythmias:Atrial Fibrillation, Signs/Symptoms:Dyspnea and                  Shortness of Breath; Risk Factors:Hypertension, Dyslipidemia and                 Former Smoker.  Sonographer:    Juliene Rucks Referring Phys: [id number] MARSHA ADA  Sonographer Comments: Image acquisition challenging due to uncooperative patient. IMPRESSIONS  1. Left ventricular ejection fraction, by estimation, is >75%. The left ventricle has hyperdynamic function. The left ventricle has no regional wall motion abnormalities. There is mild left ventricular hypertrophy. Left ventricular diastolic function could not be evaluated.  2. Right ventricular systolic function is normal. The right ventricular size is mildly enlarged. There is severely elevated pulmonary artery systolic pressure. The estimated right ventricular systolic pressure is 68.5 mmHg.  3. Right atrial size was severely dilated.  4. The mitral valve is grossly normal. Trivial mitral  valve regurgitation.  5. Tricuspid valve regurgitation is mild to moderate.  6. The aortic valve is tricuspid. Aortic valve regurgitation is mild. Aortic valve sclerosis/calcification is present, without any evidence of aortic stenosis.  7. The inferior vena cava is normal in size with <50% respiratory variability, suggesting right atrial pressure of 8 mmHg.  8. Agitated saline contrast bubble study was negative, with no evidence of any interatrial shunt. Comparison(s): No prior Echocardiogram. FINDINGS  Left Ventricle: Left ventricular ejection fraction, by estimation, is >75%. The left ventricle has hyperdynamic function. The left ventricle has no regional wall motion abnormalities. The left ventricular internal cavity size was normal in size. There is mild left ventricular hypertrophy. Left ventricular diastolic function could not be evaluated due to atrial fibrillation. Left ventricular diastolic function could not be evaluated. Right Ventricle: The right ventricular size is mildly enlarged. No increase in right ventricular wall thickness. Right ventricular systolic  function is normal. There is severely elevated pulmonary artery systolic pressure. The tricuspid regurgitant velocity is 3.89 m/s, and with an assumed right atrial pressure of 8 mmHg, the estimated right ventricular systolic pressure is 68.5 mmHg. Left Atrium: Left atrial size was normal in size. Right Atrium: Right atrial size was severely dilated. Pericardium: There is no evidence of pericardial effusion. Mitral Valve: The mitral valve is grossly normal. Trivial mitral valve regurgitation. Tricuspid Valve: The tricuspid valve is grossly normal. Tricuspid valve regurgitation is mild to moderate. Aortic Valve: The aortic valve is tricuspid. Aortic valve regurgitation is mild. Aortic valve sclerosis/calcification is present, without any evidence of aortic stenosis. Pulmonic Valve: The pulmonic valve was grossly normal. Pulmonic valve regurgitation is trivial. Aorta: The aortic root and ascending aorta are structurally normal, with no evidence of dilitation. Venous: The inferior vena cava is normal in size with less than 50% respiratory variability, suggesting right atrial pressure of 8 mmHg. IAS/Shunts: No atrial level shunt detected by color flow Doppler. Agitated saline contrast was given intravenously to evaluate for intracardiac shunting. Agitated saline contrast bubble study was negative, with no evidence of any interatrial shunt.  IVC IVC diam: 1.90 cm LEFT ATRIUM             Index        RIGHT ATRIUM           Index LA Vol (A2C):   47.7 ml 27.64 ml/m  RA Area:     26.20 cm LA Vol (A4C):   54.1 ml 31.35 ml/m  RA Volume:   86.10 ml  49.90 ml/m LA Biplane Vol: 52.1 ml 30.19 ml/m  TRICUSPID VALVE TR Peak grad:   60.5 mmHg TR Vmax:        389.00 cm/s Vinie Maxcy MD Electronically signed by Vinie Maxcy MD Signature Date/Time: 02/01/2024/12:26:35 PM    Final    DG Chest Portable 1 View Result Date: 01/31/2024 EXAM: 1 VIEW XRAY OF THE CHEST 01/31/2024 08:20:00 AM COMPARISON: 02/14/2023 stable  cardiomegaly. CLINICAL HISTORY: Reason for exam: shob; Per triage notes: Cough/nasal congestion/ SHOB or 2 days. FINDINGS: LUNGS AND PLEURA: No focal pulmonary opacity. No pulmonary edema. No pleural effusion. No pneumothorax. HEART AND MEDIASTINUM: Stable cardiomegaly. BONES AND SOFT TISSUES: No acute osseous abnormality. IMPRESSION: 1. Stable cardiomegaly. Electronically signed by: Lynwood Seip MD 01/31/2024 08:26 AM EDT RP Workstation: [id number]     Labs:   Basic Metabolic Panel: Recent Labs  Lab 02/01/24 1806 02/02/24 0011 02/02/24 9193 02/03/24 9554 02/03/24 0831 02/03/24 1555 02/03/24 1834 02/04/24 0008 02/04/24 9382 02/04/24 0810 02/05/24  9359 02/06/24 0412  NA 123* 123*   < > 124*   < > 129*  128* 145 130*  --  132* 133* 134*  K 4.2 4.1  --  4.1  --  4.0 3.4*  --   --  3.6 3.4* 4.5  CL 89* 93*  --  94*  --  96* 97*  --   --  99 101 103  CO2 19* 18*  --  21*  --  19* 18*  --   --  20* 19* 20*  GLUCOSE 116* 105*  --  125*  --  136* 134*  --   --  136* 143* 118*  BUN 19 20  --  15  --  10 8  --   --  5* <5* 5*  CREATININE 1.10* 0.94  --  0.62  --  0.44 0.52  --   --  0.39* 0.42* 0.57  CALCIUM  8.6* 8.6*  --  8.3*  --  8.5* 7.4*  --   --  8.7* 8.7* 8.7*  MG 2.4 2.3  --  1.7  --   --   --   --  1.7  --  1.8  --   PHOS 4.1 4.2  --  2.0*  --   --   --   --  2.5  --  2.8  --    < > = values in this interval not displayed.   GFR Estimated Creatinine Clearance: 56.6 mL/min (by C-G formula based on SCr of 0.57 mg/dL). Liver Function Tests: Recent Labs  Lab 01/31/24 0939 02/01/24 1806  AST 36 20  ALT 23 19  ALKPHOS 84 66  BILITOT 0.8 0.9  PROT 8.4* 7.6  ALBUMIN 3.8 3.3*   No results for input(s): LIPASE, AMYLASE in the last 168 hours. Recent Labs  Lab 02/01/24 1806  AMMONIA 15   Coagulation profile No results for input(s): INR, PROTIME in the last 168 hours.  CBC: Recent Labs  Lab 02/01/24 1806 02/02/24 0011 02/03/24 0445 02/04/24 0617 02/05/24 0640   WBC 22.6* 22.6* 16.4* 11.2* 9.8  HGB 12.4 12.4 11.2* 11.8* 11.9*  HCT 35.8* 34.8* 31.1* 34.0* 34.9*  MCV 92.5 91.6 91.5 90.9 93.3  PLT 258 247 253 323 324   Cardiac Enzymes: No results for input(s): CKTOTAL, CKMB, CKMBINDEX, TROPONINI in the last 168 hours. BNP: Invalid input(s): POCBNP CBG: Recent Labs  Lab 02/01/24 1621 02/01/24 1738 02/01/24 1923 02/01/24 2308 02/02/24 0306  GLUCAP 147* 140* 110* 99 98   D-Dimer No results for input(s): DDIMER in the last 72 hours. Hgb A1c No results for input(s): HGBA1C in the last 72 hours. Lipid Profile No results for input(s): CHOL, HDL, LDLCALC, TRIG, CHOLHDL, LDLDIRECT in the last 72 hours. Thyroid  function studies No results for input(s): TSH, T4TOTAL, T3FREE, THYROIDAB in the last 72 hours.  Invalid input(s): FREET3 Anemia work up No results for input(s): VITAMINB12, FOLATE, FERRITIN, TIBC, IRON, RETICCTPCT in the last 72 hours. Microbiology Recent Results (from the past 240 hours)  Resp panel by RT-PCR (RSV, Flu A&B, Covid) Anterior Nasal Swab     Status: None   Collection Time: 01/31/24  8:06 AM   Specimen: Anterior Nasal Swab  Result Value Ref Range Status   SARS Coronavirus 2 by RT PCR NEGATIVE NEGATIVE Final   Influenza A by PCR NEGATIVE NEGATIVE Final   Influenza B by PCR NEGATIVE NEGATIVE Final    Comment: (NOTE) The Xpert Xpress SARS-CoV-2/FLU/RSV plus assay is intended  as an aid in the diagnosis of influenza from Nasopharyngeal swab specimens and should not be used as a sole basis for treatment. Nasal washings and aspirates are unacceptable for Xpert Xpress SARS-CoV-2/FLU/RSV testing.  Fact Sheet for Patients: bloggercourse.com  Fact Sheet for Healthcare Providers: seriousbroker.it  This test is not yet approved or cleared by the [place]  FDA and has been authorized for detection and/or diagnosis of  SARS-CoV-2 by FDA under an Emergency Use Authorization (EUA). This EUA will remain in effect (meaning this test can be used) for the duration of the COVID-19 declaration under Section 564(b)(1) of the Act, 21 U.S.C. section 360bbb-3(b)(1), unless the authorization is terminated or revoked.     Resp Syncytial Virus by PCR NEGATIVE NEGATIVE Final    Comment: (NOTE) Fact Sheet for Patients: bloggercourse.com  Fact Sheet for Healthcare Providers: seriousbroker.it  This test is not yet approved or cleared by the [place]  FDA and has been authorized for detection and/or diagnosis of SARS-CoV-2 by FDA under an Emergency Use Authorization (EUA). This EUA will remain in effect (meaning this test can be used) for the duration of the COVID-19 declaration under Section 564(b)(1) of the Act, 21 U.S.C. section 360bbb-3(b)(1), unless the authorization is terminated or revoked.  Performed at [facility], [address], [place], [place] [address]   Urine Culture (for pregnant, neutropenic or urologic patients or patients with an indwelling urinary catheter)     Status: Abnormal   Collection Time: 02/01/24  6:00 AM   Specimen: Urine, Clean Catch  Result Value Ref Range Status   Specimen Description URINE, CLEAN CATCH  Final   Special Requests NONE  Final   Culture (A)  Final    >=100,000 COLONIES/mL AEROCOCCUS URINAE Standardized susceptibility testing for this organism is not available. Performed at [facility], [address], [place], [place] [address]    Report Status 02/03/2024 FINAL  Final  MRSA Next Gen by PCR, Nasal     Status: None   Collection Time: 02/01/24  4:45 PM   Specimen: Nasal Swab  Result Value Ref Range Status   MRSA by PCR Next Gen NOT DETECTED NOT DETECTED Final    Comment: (NOTE) The GeneXpert MRSA Assay (FDA approved for NASAL specimens only), is one component of a comprehensive MRSA colonization  surveillance program. It is not intended to diagnose MRSA infection nor to guide or monitor treatment for MRSA infections. Test performance is not FDA approved in patients less than 55 years old. Performed at [facility], [address], [place], [place] [address]      Discharge Instructions:   Discharge Instructions     Discharge instructions   Complete by: As directed    BMP 1 week-- may need adjustment of salt tabs/trileptal  Per urology-- plan to d/c with foley-- follow up in office for voiding trial Soft diet for ease of eating/less chewing-- adjust at home to what she likes   Increase activity slowly   Complete by: As directed       Allergies as of 02/06/2024       Reactions   Sulfa Antibiotics Rash        Medication List     PAUSE taking these medications    metoprolol  tartrate 25 MG tablet Wait to take this until your doctor or other care provider tells you to start again. Commonly known as: LOPRESSOR  TAKE 1 TABLET TWICE DAILY       TAKE these medications  acetaminophen  650 MG CR tablet Commonly known as: TYLENOL  Take 650 mg by mouth in the morning and at bedtime.   cetirizine 5 MG tablet Commonly known as: ZYRTEC Take 5 mg by mouth daily as needed for allergies.   cholecalciferol 10 MCG/ML Liqd oral liquid Commonly known as: VITAMIN D3 Take 400 Units by mouth daily.   CRANBERRY PO Take 1 capsule by mouth daily at 12 noon.   diltiazem  240 MG 24 hr capsule Commonly known as: CARDIZEM  CD TAKE 1 CAPSULE EVERY DAY   Dimethyl Fumarate  240 MG Cpdr Take 1 capsule (240 mg total) by mouth in the morning and at bedtime.   Eliquis  5 MG Tabs tablet Generic drug: apixaban  TAKE 1 TABLET TWICE DAILY   feeding supplement Liqd Take 237 mLs by mouth 2 (two) times daily between meals.   gabapentin  600 MG tablet Commonly known as: NEURONTIN  Take 1 tablet (600 mg total) by mouth in the morning, at noon, in the evening, and at bedtime.    levothyroxine  137 MCG tablet Commonly known as: SYNTHROID  Take 137 mcg by mouth daily before breakfast.   multivitamin with minerals Tabs tablet Take 1 tablet by mouth at bedtime.   omeprazole 20 MG capsule Commonly known as: PRILOSEC Take 20 mg by mouth daily as needed (reflux).   Oxcarbazepine  300 MG tablet Commonly known as: TRILEPTAL  Take 1 tablet (300 mg total) by mouth 2 (two) times daily. What changed: when to take this   rosuvastatin  20 MG tablet Commonly known as: CRESTOR  Take 10 mg by mouth daily.   sodium chloride  1 g tablet Take 1 tablet (1 g total) by mouth daily. Start taking on: February 07, 2024   tamsulosin 0.4 MG Caps capsule Commonly known as: FLOMAX Take 0.4 mg by mouth daily after supper.               Durable Medical Equipment  (From admission, onward)           Start     Ordered   02/06/24 0935  For home use only DME standard manual wheelchair with seat cushion  Once       Comments: Patient suffers from Multiple sclerosis  which impairs their ability to perform daily activities like ambulating  in the home.  A cane  will not resolve issue with performing activities of daily living. A wheelchair will allow patient to safely perform daily activities. Patient can safely propel the wheelchair in the home or has a caregiver who can provide assistance. Length of need lifetime . Accessories: elevating leg rests (ELRs), wheel locks, extensions and anti-tippers.  Seat and back cushions   02/06/24 0935            Follow-up Information     [facility] Follow up.   Specialty: Home Health Services Contact information: [address] [place] [address] [place] [address] [phone]         [facility] .   Contact information: [address] [place] [address] [phone]         [facility] (DME) Follow up.   Specialty: DME Services Why: company wheel chair was ordered  from [employer] information: [address] [place]  [address] [phone]                 Time coordinating discharge: 45 minutes  Signed:  Harlene RAYMOND Bowl DO  [facility] 02/06/2024, 10:04 AM

## 2024-02-06 NOTE — TOC Progression Note (Addendum)
 Transition of Care (TOC) - Progression Note   Spoke to patient at bedside.   Plan discharge today with foley catheter. Order for home health RN. Patient lives with her daughter Jillian Fisher [phone].   Patient consented for NCM to call daughter Jillian Fisher and discuss discharge plan.   NCM called Jillian Fisher. Patient lives with Jillian Fisher , has CNA in mornings. Patient has hoyer lift and hospital bed at home.   Patient has a wheelchair at home , daughter trying to get a new wheelchair , they have lost the leg rests. Daughter has a prescription from PCP , however needs to find a company to order wheelchair with.   NCM asked if daughter would like NCM to ask Attending for wheelchair and submit to a DME company . Daughter in agreement. Choice offered . Daughter would like [facility]. NCM entered order and DME note and sent to [facility] with [facility].  [name] confirmed he received order . Daughter aware insurance will only cover if it's been 5 years or greater since last wheelchair. Daughter is contact person for delivery of wheelchair to home address.   Discussed home health RN order with daughter. Choice offered , she would like [facility]. Burnard with [facility] accepted referral.    Patient will need ambulance transport home at discharge. Jillian Fisher confirmed address on face sheet. NCM will call Jillian Fisher prior to calling ambulance. Jillian Fisher stated that she plans to be at bedside at 1 pm today unless ambulance called prior to that.   NCM messaged team all of above . NCM asked bedside nurse to notify NCM when ready for NCM to call ambulance.   Called [facility], estimated time 1 to 1.5 hours. Patient and daughter aware . Secure chatted team  Patient Details  Name: Jillian Fisher MRN: [id number] Date of Birth: [birth year]  Transition of Care [facility]) CM/SW Contact  Rayan Dyal, Powell Jansky, RN Phone Number: 02/06/2024, 9:43 AM  Clinical Narrative:         Barriers to Discharge: Continued Medical Work up                Expected Discharge Plan and Services   Discharge Planning Services: CM Consult   Living arrangements for the past 2 months: Single Family Home                                       Social Drivers of Health (SDOH) Interventions SDOH Screenings   Food Insecurity: No Food Insecurity (02/02/2024)  Housing: Low Risk  (02/02/2024)  Transportation Needs: No Transportation Needs (02/02/2024)  Utilities: Not At Risk (02/02/2024)  Social Connections: Socially Isolated (02/02/2024)  Tobacco Use: Medium Risk (01/31/2024)    Readmission Risk Interventions     No data to display

## 2024-02-06 NOTE — Progress Notes (Signed)
 Patient has been discharged per MD order. IV's removed, tolerated well. Foley catheter is to remain in place until follow up, per urology. Home medication and TOC medications have been picked up and given to patient (Daughter Josiah Wojtaszek is on her way and will be given to her at time of arrival). AVS instructions have been reviewed with Nena Bohr, verbalized understanding. [facility] will be arranged to transport patient, all needs met at this time. Bed in lowest position and call light within reach.

## 2024-03-21 ENCOUNTER — Ambulatory Visit: Admitting: Cardiology

## 2024-04-17 DIAGNOSIS — E782 Mixed hyperlipidemia: Secondary | ICD-10-CM | POA: Insufficient documentation

## 2024-04-17 NOTE — Progress Notes (Unsigned)
" °  Cardiology Office Note:  .   Date:  04/17/2024  ID:  Jillian Fisher, DOB 19-Jun-1945, MRN [id number] PCP: Leonel Cole, MD  [facility] Providers Cardiologist:  Newman Lawrence, MD PCP: Leonel Cole, MD  No chief complaint on file.    Jillian Fisher is a 79 y.o. female with hypertension, hyperlipidemia, multiple sclerosis, paroxysmal A-fib  Discussed the use of AI scribe software for clinical note transcription with the patient, who gave verbal consent to proceed.  History of Present Illness  ***  Patient was last seen by Dr. 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      There were no vitals filed for this visit.    ROS      Studies Reviewed: .        ***  Echocardiogram 01/2024:  1. Left ventricular ejection fraction, by estimation, is >75%. The left  ventricle has hyperdynamic function. The left ventricle has no regional  wall motion abnormalities. There is mild left ventricular hypertrophy.  Left ventricular diastolic function could not be evaluated.   2. Right ventricular systolic function is normal. The right ventricular  size is mildly enlarged. There is severely elevated pulmonary artery  systolic pressure. The estimated right ventricular systolic pressure is  68.5 mmHg.   3. Right atrial size was severely dilated.   4. The mitral valve is grossly normal. Trivial mitral valve  regurgitation.   5. Tricuspid valve regurgitation is mild to moderate.   6. The aortic valve is tricuspid. Aortic valve regurgitation is mild.  Aortic valve sclerosis/calcification is present, without any evidence of  aortic stenosis.   7. The inferior vena cava is normal in size with <50% respiratory  variability, suggesting right atrial pressure of 8 mmHg.   8. Agitated saline contrast bubble study was negative, with no evidence  of any interatrial shunt.   Comparison(s): No prior  Echocardiogram.   Labs 02/2024: Chol ***, TG ***, HDL ***, LDL *** HbA1C ***% Hb 11.9 Cr 0.57, Na 134 TSH 2.4  Risk Assessment/Calculations:   {Does this patient have ATRIAL FIBRILLATION?[phone]}    Physical Exam   VISIT DIAGNOSES: No diagnosis found.   Jillian Fisher is a 79 y.o. female with *** Assessment and Plan Assessment & Plan       {Are you ordering a CV Procedure (e.g. stress test, cath, DCCV, TEE, etc)?   Press F2        :[id number]}    No orders of the defined types were placed in this encounter.    F/u in ***  Signed, Newman JINNY Lawrence, MD  "

## 2024-04-23 ENCOUNTER — Ambulatory Visit: Admitting: Cardiology

## 2024-04-23 DIAGNOSIS — E782 Mixed hyperlipidemia: Secondary | ICD-10-CM

## 2024-04-23 DIAGNOSIS — I10 Essential (primary) hypertension: Secondary | ICD-10-CM

## 2024-04-23 DIAGNOSIS — I480 Paroxysmal atrial fibrillation: Secondary | ICD-10-CM

## 2024-05-30 ENCOUNTER — Ambulatory Visit: Admitting: Cardiology

## 2024-12-12 ENCOUNTER — Telehealth: Admitting: Neurology
